# Patient Record
Sex: Female | Born: 1945
Health system: Southern US, Community
[De-identification: ages and names within clinical notes are randomized; demographics above are authoritative.]

## PROBLEM LIST (undated history)

## (undated) DIAGNOSIS — K649 Unspecified hemorrhoids: Secondary | ICD-10-CM

## (undated) DIAGNOSIS — K219 Gastro-esophageal reflux disease without esophagitis: Secondary | ICD-10-CM

## (undated) DIAGNOSIS — F419 Anxiety disorder, unspecified: Secondary | ICD-10-CM

## (undated) DIAGNOSIS — Z8719 Personal history of other diseases of the digestive system: Secondary | ICD-10-CM

## (undated) DIAGNOSIS — E785 Hyperlipidemia, unspecified: Secondary | ICD-10-CM

## (undated) DIAGNOSIS — N840 Polyp of corpus uteri: Secondary | ICD-10-CM

## (undated) DIAGNOSIS — K5790 Diverticulosis of intestine, part unspecified, without perforation or abscess without bleeding: Secondary | ICD-10-CM

## (undated) DIAGNOSIS — H409 Unspecified glaucoma: Secondary | ICD-10-CM

## (undated) DIAGNOSIS — I2510 Atherosclerotic heart disease of native coronary artery without angina pectoris: Secondary | ICD-10-CM

## (undated) DIAGNOSIS — I10 Essential (primary) hypertension: Secondary | ICD-10-CM

## (undated) DIAGNOSIS — N200 Calculus of kidney: Secondary | ICD-10-CM

## (undated) HISTORY — DX: Unspecified glaucoma: H40.9

## (undated) HISTORY — PX: INCISIONAL HERNIA REPAIR: SHX193

## (undated) HISTORY — DX: Atherosclerotic heart disease of native coronary artery without angina pectoris: I25.10

## (undated) HISTORY — DX: Anxiety disorder, unspecified: F41.9

## (undated) HISTORY — PX: VENTRAL HERNIA REPAIR: SHX424

## (undated) HISTORY — DX: Gastro-esophageal reflux disease without esophagitis: K21.9

## (undated) HISTORY — DX: Calculus of kidney: N20.0

## (undated) HISTORY — PX: OTHER SURGICAL HISTORY: SHX169

## (undated) HISTORY — DX: Hyperlipidemia, unspecified: E78.5

## (undated) HISTORY — PX: ABDOMINAL EXPLORATION SURGERY: SHX538

## (undated) HISTORY — DX: Essential (primary) hypertension: I10

## (undated) HISTORY — PX: HERNIA REPAIR: SHX51

---

## 1997-12-08 ENCOUNTER — Observation Stay (HOSPITAL_COMMUNITY): Admission: EM | Admit: 1997-12-08 | Discharge: 1997-12-09 | Payer: Self-pay | Admitting: Emergency Medicine

## 1997-12-08 ENCOUNTER — Ambulatory Visit (HOSPITAL_COMMUNITY): Admission: RE | Admit: 1997-12-08 | Discharge: 1997-12-08 | Payer: Self-pay | Admitting: General Surgery

## 1998-06-04 ENCOUNTER — Inpatient Hospital Stay (HOSPITAL_COMMUNITY): Admission: RE | Admit: 1998-06-04 | Discharge: 1998-06-15 | Payer: Self-pay | Admitting: Surgery

## 1998-06-05 ENCOUNTER — Encounter: Payer: Self-pay | Admitting: Surgery

## 1998-06-11 ENCOUNTER — Encounter: Payer: Self-pay | Admitting: Surgery

## 1998-06-13 ENCOUNTER — Encounter: Payer: Self-pay | Admitting: General Surgery

## 1998-10-19 ENCOUNTER — Other Ambulatory Visit: Admission: RE | Admit: 1998-10-19 | Discharge: 1998-10-19 | Payer: Self-pay | Admitting: Obstetrics & Gynecology

## 1999-03-22 ENCOUNTER — Encounter: Payer: Self-pay | Admitting: Urology

## 1999-03-22 ENCOUNTER — Ambulatory Visit (HOSPITAL_COMMUNITY): Admission: RE | Admit: 1999-03-22 | Discharge: 1999-03-22 | Payer: Self-pay | Admitting: Urology

## 1999-04-06 ENCOUNTER — Other Ambulatory Visit: Admission: RE | Admit: 1999-04-06 | Discharge: 1999-04-06 | Payer: Self-pay | Admitting: Obstetrics & Gynecology

## 1999-04-06 ENCOUNTER — Encounter (INDEPENDENT_AMBULATORY_CARE_PROVIDER_SITE_OTHER): Payer: Self-pay

## 1999-09-20 ENCOUNTER — Other Ambulatory Visit: Admission: RE | Admit: 1999-09-20 | Discharge: 1999-09-20 | Payer: Self-pay | Admitting: Obstetrics & Gynecology

## 2000-03-01 ENCOUNTER — Encounter: Payer: Self-pay | Admitting: Surgery

## 2000-03-07 ENCOUNTER — Inpatient Hospital Stay (HOSPITAL_COMMUNITY): Admission: RE | Admit: 2000-03-07 | Discharge: 2000-03-23 | Payer: Self-pay | Admitting: Surgery

## 2000-03-19 ENCOUNTER — Encounter: Payer: Self-pay | Admitting: Internal Medicine

## 2000-03-22 ENCOUNTER — Encounter: Payer: Self-pay | Admitting: General Surgery

## 2000-03-29 ENCOUNTER — Inpatient Hospital Stay (HOSPITAL_COMMUNITY): Admission: EM | Admit: 2000-03-29 | Discharge: 2000-04-01 | Payer: Self-pay | Admitting: Internal Medicine

## 2000-03-29 ENCOUNTER — Encounter: Payer: Self-pay | Admitting: Internal Medicine

## 2000-03-30 ENCOUNTER — Encounter: Payer: Self-pay | Admitting: Endocrinology

## 2000-03-31 ENCOUNTER — Encounter: Payer: Self-pay | Admitting: Endocrinology

## 2001-01-28 ENCOUNTER — Other Ambulatory Visit: Admission: RE | Admit: 2001-01-28 | Discharge: 2001-01-28 | Payer: Self-pay | Admitting: *Deleted

## 2001-02-01 ENCOUNTER — Encounter: Payer: Self-pay | Admitting: Emergency Medicine

## 2001-02-01 ENCOUNTER — Emergency Department (HOSPITAL_COMMUNITY): Admission: EM | Admit: 2001-02-01 | Discharge: 2001-02-01 | Payer: Self-pay | Admitting: Emergency Medicine

## 2001-03-29 ENCOUNTER — Encounter: Payer: Self-pay | Admitting: Emergency Medicine

## 2001-03-30 ENCOUNTER — Inpatient Hospital Stay (HOSPITAL_COMMUNITY): Admission: EM | Admit: 2001-03-30 | Discharge: 2001-04-02 | Payer: Self-pay | Admitting: Cardiology

## 2001-03-30 ENCOUNTER — Encounter: Payer: Self-pay | Admitting: Cardiology

## 2001-07-23 ENCOUNTER — Encounter: Payer: Self-pay | Admitting: Internal Medicine

## 2001-07-23 ENCOUNTER — Ambulatory Visit (HOSPITAL_COMMUNITY): Admission: RE | Admit: 2001-07-23 | Discharge: 2001-07-23 | Payer: Self-pay | Admitting: Internal Medicine

## 2001-08-01 ENCOUNTER — Ambulatory Visit (HOSPITAL_COMMUNITY): Admission: RE | Admit: 2001-08-01 | Discharge: 2001-08-01 | Payer: Self-pay | Admitting: Internal Medicine

## 2001-08-01 ENCOUNTER — Encounter: Payer: Self-pay | Admitting: Internal Medicine

## 2001-08-30 ENCOUNTER — Ambulatory Visit (HOSPITAL_COMMUNITY): Admission: RE | Admit: 2001-08-30 | Discharge: 2001-08-30 | Payer: Self-pay | Admitting: Urology

## 2001-08-30 ENCOUNTER — Encounter: Payer: Self-pay | Admitting: Urology

## 2002-10-21 ENCOUNTER — Ambulatory Visit (HOSPITAL_COMMUNITY): Admission: RE | Admit: 2002-10-21 | Discharge: 2002-10-21 | Payer: Self-pay | Admitting: *Deleted

## 2002-10-21 ENCOUNTER — Encounter: Payer: Self-pay | Admitting: *Deleted

## 2003-07-07 ENCOUNTER — Ambulatory Visit (HOSPITAL_COMMUNITY): Admission: RE | Admit: 2003-07-07 | Discharge: 2003-07-07 | Payer: Self-pay | Admitting: Urology

## 2004-08-25 ENCOUNTER — Ambulatory Visit: Payer: Self-pay | Admitting: Internal Medicine

## 2004-09-06 ENCOUNTER — Ambulatory Visit: Payer: Self-pay | Admitting: Internal Medicine

## 2004-09-16 ENCOUNTER — Ambulatory Visit: Payer: Self-pay | Admitting: Internal Medicine

## 2004-09-27 ENCOUNTER — Ambulatory Visit (HOSPITAL_COMMUNITY): Admission: RE | Admit: 2004-09-27 | Discharge: 2004-09-27 | Payer: Self-pay | Admitting: Ophthalmology

## 2004-11-28 ENCOUNTER — Emergency Department (HOSPITAL_COMMUNITY): Admission: EM | Admit: 2004-11-28 | Discharge: 2004-11-28 | Payer: Self-pay | Admitting: *Deleted

## 2004-11-29 ENCOUNTER — Emergency Department (HOSPITAL_COMMUNITY): Admission: EM | Admit: 2004-11-29 | Discharge: 2004-11-29 | Payer: Self-pay | Admitting: Emergency Medicine

## 2004-11-30 ENCOUNTER — Ambulatory Visit: Payer: Self-pay | Admitting: Family Medicine

## 2004-12-06 ENCOUNTER — Ambulatory Visit: Payer: Self-pay | Admitting: Internal Medicine

## 2005-01-23 ENCOUNTER — Ambulatory Visit: Payer: Self-pay | Admitting: Family Medicine

## 2005-03-07 ENCOUNTER — Ambulatory Visit: Payer: Self-pay | Admitting: Internal Medicine

## 2005-06-12 ENCOUNTER — Ambulatory Visit: Payer: Self-pay | Admitting: Internal Medicine

## 2005-06-19 ENCOUNTER — Ambulatory Visit: Payer: Self-pay | Admitting: Internal Medicine

## 2005-07-27 ENCOUNTER — Ambulatory Visit: Payer: Self-pay | Admitting: Internal Medicine

## 2005-08-08 ENCOUNTER — Ambulatory Visit (HOSPITAL_COMMUNITY): Admission: RE | Admit: 2005-08-08 | Discharge: 2005-08-08 | Payer: Self-pay | Admitting: General Surgery

## 2005-08-18 ENCOUNTER — Inpatient Hospital Stay (HOSPITAL_COMMUNITY): Admission: RE | Admit: 2005-08-18 | Discharge: 2005-08-24 | Payer: Self-pay | Admitting: General Surgery

## 2005-11-13 ENCOUNTER — Ambulatory Visit: Payer: Self-pay | Admitting: Internal Medicine

## 2005-11-27 ENCOUNTER — Ambulatory Visit: Payer: Self-pay | Admitting: Internal Medicine

## 2006-01-22 ENCOUNTER — Ambulatory Visit: Payer: Self-pay | Admitting: Internal Medicine

## 2006-01-25 ENCOUNTER — Ambulatory Visit (HOSPITAL_COMMUNITY): Admission: RE | Admit: 2006-01-25 | Discharge: 2006-01-25 | Payer: Self-pay | Admitting: Obstetrics & Gynecology

## 2006-02-01 ENCOUNTER — Inpatient Hospital Stay (HOSPITAL_COMMUNITY): Admission: EM | Admit: 2006-02-01 | Discharge: 2006-02-05 | Payer: Self-pay | Admitting: Emergency Medicine

## 2006-02-03 ENCOUNTER — Ambulatory Visit: Payer: Self-pay | Admitting: Internal Medicine

## 2006-02-27 ENCOUNTER — Ambulatory Visit: Payer: Self-pay | Admitting: Internal Medicine

## 2006-03-08 ENCOUNTER — Ambulatory Visit: Payer: Self-pay | Admitting: Internal Medicine

## 2006-05-08 ENCOUNTER — Ambulatory Visit: Payer: Self-pay | Admitting: Internal Medicine

## 2006-05-11 ENCOUNTER — Ambulatory Visit (HOSPITAL_COMMUNITY): Admission: RE | Admit: 2006-05-11 | Discharge: 2006-05-11 | Payer: Self-pay | Admitting: General Surgery

## 2006-06-03 ENCOUNTER — Emergency Department (HOSPITAL_COMMUNITY): Admission: EM | Admit: 2006-06-03 | Discharge: 2006-06-03 | Payer: Self-pay | Admitting: Emergency Medicine

## 2006-08-09 ENCOUNTER — Ambulatory Visit: Payer: Self-pay | Admitting: Internal Medicine

## 2006-08-09 LAB — CONVERTED CEMR LAB
ALT: 16 units/L (ref 0–40)
AST: 20 units/L (ref 0–37)
Albumin: 4 g/dL (ref 3.5–5.2)
BUN: 7 mg/dL (ref 6–23)
Basophils Relative: 1.1 % — ABNORMAL HIGH (ref 0.0–1.0)
Eosinophil percent: 4.3 % (ref 0.0–5.0)
GFR calc non Af Amer: 68 mL/min
HCT: 39.1 % (ref 36.0–46.0)
Hemoglobin: 13.3 g/dL (ref 12.0–15.0)
MCHC: 34 g/dL (ref 30.0–36.0)
Neutro Abs: 5.9 10*3/uL (ref 1.4–7.7)
Neutrophils Relative %: 66.2 % (ref 43.0–77.0)
Potassium: 3.9 meq/L (ref 3.5–5.1)
RBC: 4.16 M/uL (ref 3.87–5.11)
RDW: 12.8 % (ref 11.5–14.6)
Sodium: 142 meq/L (ref 135–145)
Total Bilirubin: 0.9 mg/dL (ref 0.3–1.2)
Total Protein: 6.8 g/dL (ref 6.0–8.3)
Triglyceride fasting, serum: 159 mg/dL — ABNORMAL HIGH (ref 0–149)
VLDL: 32 mg/dL (ref 0–40)
WBC: 9 10*3/uL (ref 4.5–10.5)

## 2006-11-08 ENCOUNTER — Ambulatory Visit: Payer: Self-pay | Admitting: Internal Medicine

## 2007-01-25 ENCOUNTER — Ambulatory Visit: Payer: Self-pay | Admitting: Internal Medicine

## 2007-03-14 DIAGNOSIS — K219 Gastro-esophageal reflux disease without esophagitis: Secondary | ICD-10-CM

## 2007-03-14 DIAGNOSIS — E785 Hyperlipidemia, unspecified: Secondary | ICD-10-CM

## 2007-03-14 DIAGNOSIS — I10 Essential (primary) hypertension: Secondary | ICD-10-CM

## 2007-03-14 DIAGNOSIS — Z87442 Personal history of urinary calculi: Secondary | ICD-10-CM

## 2007-03-14 DIAGNOSIS — I2510 Atherosclerotic heart disease of native coronary artery without angina pectoris: Secondary | ICD-10-CM | POA: Insufficient documentation

## 2007-03-14 DIAGNOSIS — F411 Generalized anxiety disorder: Secondary | ICD-10-CM | POA: Insufficient documentation

## 2007-03-14 DIAGNOSIS — H409 Unspecified glaucoma: Secondary | ICD-10-CM

## 2007-03-27 ENCOUNTER — Ambulatory Visit: Payer: Self-pay | Admitting: Internal Medicine

## 2007-03-27 DIAGNOSIS — R609 Edema, unspecified: Secondary | ICD-10-CM

## 2007-03-27 DIAGNOSIS — R1907 Generalized intra-abdominal and pelvic swelling, mass and lump: Secondary | ICD-10-CM | POA: Insufficient documentation

## 2007-03-28 ENCOUNTER — Telehealth (INDEPENDENT_AMBULATORY_CARE_PROVIDER_SITE_OTHER): Payer: Self-pay | Admitting: *Deleted

## 2007-03-29 ENCOUNTER — Ambulatory Visit (HOSPITAL_COMMUNITY): Admission: RE | Admit: 2007-03-29 | Discharge: 2007-03-29 | Payer: Self-pay | Admitting: General Surgery

## 2007-05-06 ENCOUNTER — Telehealth: Payer: Self-pay | Admitting: Internal Medicine

## 2007-06-28 ENCOUNTER — Ambulatory Visit: Payer: Self-pay | Admitting: Internal Medicine

## 2007-08-29 ENCOUNTER — Ambulatory Visit: Payer: Self-pay | Admitting: Internal Medicine

## 2007-08-29 DIAGNOSIS — R5383 Other fatigue: Secondary | ICD-10-CM

## 2007-08-29 DIAGNOSIS — R5381 Other malaise: Secondary | ICD-10-CM

## 2007-08-29 LAB — CONVERTED CEMR LAB
ALT: 21 units/L (ref 0–35)
AST: 20 units/L (ref 0–37)
Bilirubin, Direct: 0.1 mg/dL (ref 0.0–0.3)
CO2: 32 meq/L (ref 19–32)
Calcium: 9.2 mg/dL (ref 8.4–10.5)
Cholesterol: 136 mg/dL (ref 0–200)
GFR calc Af Amer: 94 mL/min
Glucose, Bld: 93 mg/dL (ref 70–99)
HDL: 33.8 mg/dL — ABNORMAL LOW (ref >39.0)
Sodium: 145 meq/L (ref 135–145)
TSH: 2.26 microintl units/mL (ref 0.35–5.50)
Total CHOL/HDL Ratio: 4
Total Protein: 6.7 g/dL (ref 6.0–8.3)

## 2007-09-05 ENCOUNTER — Ambulatory Visit: Payer: Self-pay | Admitting: Internal Medicine

## 2007-09-05 LAB — CONVERTED CEMR LAB: LDL Goal: 100 mg/dL

## 2008-05-12 ENCOUNTER — Ambulatory Visit: Payer: Self-pay | Admitting: Internal Medicine

## 2008-08-18 ENCOUNTER — Ambulatory Visit: Payer: Self-pay | Admitting: Internal Medicine

## 2008-08-18 LAB — CONVERTED CEMR LAB
HDL: 34.7 mg/dL — ABNORMAL LOW (ref >39.0)
Total Bilirubin: 0.7 mg/dL (ref 0.3–1.2)
Total CHOL/HDL Ratio: 3.8
VLDL: 31 mg/dL (ref 0–40)

## 2008-08-25 ENCOUNTER — Ambulatory Visit: Payer: Self-pay | Admitting: Internal Medicine

## 2008-11-25 ENCOUNTER — Ambulatory Visit: Payer: Self-pay | Admitting: Internal Medicine

## 2009-03-08 ENCOUNTER — Ambulatory Visit: Payer: Self-pay | Admitting: Internal Medicine

## 2009-04-13 ENCOUNTER — Ambulatory Visit: Payer: Self-pay | Admitting: Internal Medicine

## 2009-04-13 ENCOUNTER — Telehealth: Payer: Self-pay | Admitting: Internal Medicine

## 2009-04-13 DIAGNOSIS — L259 Unspecified contact dermatitis, unspecified cause: Secondary | ICD-10-CM

## 2009-06-15 ENCOUNTER — Ambulatory Visit: Payer: Self-pay | Admitting: Internal Medicine

## 2009-06-15 LAB — CONVERTED CEMR LAB
CO2: 30 meq/L (ref 19–32)
Cholesterol: 122 mg/dL (ref 0–200)
Glucose, Bld: 92 mg/dL (ref 70–99)
Potassium: 4.4 meq/L (ref 3.5–5.1)
Sodium: 140 meq/L (ref 135–145)

## 2009-07-26 ENCOUNTER — Telehealth: Payer: Self-pay | Admitting: Internal Medicine

## 2009-10-05 ENCOUNTER — Telehealth: Payer: Self-pay | Admitting: Internal Medicine

## 2009-10-19 ENCOUNTER — Ambulatory Visit: Payer: Self-pay | Admitting: Internal Medicine

## 2010-02-08 ENCOUNTER — Telehealth: Payer: Self-pay | Admitting: Internal Medicine

## 2010-04-25 ENCOUNTER — Telehealth: Payer: Self-pay | Admitting: Internal Medicine

## 2010-04-27 ENCOUNTER — Telehealth: Payer: Self-pay | Admitting: Internal Medicine

## 2010-05-06 ENCOUNTER — Telehealth: Payer: Self-pay | Admitting: Internal Medicine

## 2010-06-22 ENCOUNTER — Encounter: Payer: Self-pay | Admitting: Internal Medicine

## 2010-06-22 ENCOUNTER — Ambulatory Visit: Payer: Self-pay | Admitting: Internal Medicine

## 2010-06-22 LAB — CONVERTED CEMR LAB
ALT: 17 units/L (ref 0–35)
AST: 21 units/L (ref 0–37)
BUN: 14 mg/dL (ref 6–23)
Chloride: 102 meq/L (ref 96–112)
Cholesterol: 120 mg/dL (ref 0–200)
Eosinophils Relative: 3.8 % (ref 0.0–5.0)
HCT: 38.2 % (ref 36.0–46.0)
Hemoglobin: 13.4 g/dL (ref 12.0–15.0)
LDL Cholesterol: 52 mg/dL (ref 0–99)
Lymphs Abs: 1.8 10*3/uL (ref 0.7–4.0)
Monocytes Relative: 7.6 % (ref 3.0–12.0)
Neutro Abs: 6.6 10*3/uL (ref 1.4–7.7)
Potassium: 4.3 meq/L (ref 3.5–5.1)
RBC: 3.95 M/uL (ref 3.87–5.11)
TSH: 2.8 microintl units/mL (ref 0.35–5.50)
Total Bilirubin: 0.5 mg/dL (ref 0.3–1.2)
WBC: 9.6 10*3/uL (ref 4.5–10.5)

## 2010-07-20 ENCOUNTER — Ambulatory Visit: Payer: Self-pay

## 2010-07-20 ENCOUNTER — Telehealth: Payer: Self-pay | Admitting: Internal Medicine

## 2010-08-17 ENCOUNTER — Ambulatory Visit
Admission: RE | Admit: 2010-08-17 | Discharge: 2010-08-17 | Payer: Self-pay | Source: Home / Self Care | Attending: Internal Medicine | Admitting: Internal Medicine

## 2010-08-19 ENCOUNTER — Telehealth: Payer: Self-pay | Admitting: Internal Medicine

## 2010-09-03 ENCOUNTER — Encounter: Payer: Self-pay | Admitting: Urology

## 2010-09-15 NOTE — Assessment & Plan Note (Signed)
Summary: 4 mo rov/mm/pt rsc/cjr   Vital Signs:  Patient profile:   65 year old female Height:      68 inches Weight:      194 pounds BMI:     29.60 Temp:     98.2 degrees F oral Pulse rate:   76 / minute Resp:     14 per minute BP sitting:   130 / 80  (left arm)  Vitals Entered By: Willy Eddy, LPN (October 19, 3084 9:39 AM) CC: roa   CC:  roa.  History of Present Illness:  Follow-Up Visit      This is a 65 year old woman who presents for Follow-up visit.  The patient denies chest pain, palpitations, dizziness, syncope, low blood sugar symptoms, high blood sugar symptoms, edema, SOB, DOE, PND, and orthopnea.  Since the last visit the patient notes no new problems or concerns.  The patient reports monitoring BP and monitoring blood sugars.  When questioned about possible medication side effects, the patient notes none.   anxiety sable  Preventive Screening-Counseling & Management  Alcohol-Tobacco     Smoking Status: never  Problems Prior to Update: 1)  Skin Rash  (ICD-782.1) 2)  Fatigue  (ICD-780.79) 3)  Family History of Cad Female 1st Degree Relative <50  (ICD-V17.3) 4)  Symptom, Swelling, Abdominal, Generalized  (ICD-789.37) 5)  Dependent Edema, Legs  (ICD-782.3) 6)  Nephrolithiasis, Hx of  (ICD-V13.01) 7)  Coronary Artery Disease  (ICD-414.00) 8)  Anxiety  (ICD-300.00) 9)  Glaucoma Nos  (ICD-365.9) 10)  Hypertension  (ICD-401.9) 11)  Hyperlipidemia  (ICD-272.4) 12)  Gerd  (ICD-530.81)  Current Problems (verified): 1)  Skin Rash  (ICD-782.1) 2)  Fatigue  (ICD-780.79) 3)  Family History of Cad Female 1st Degree Relative <50  (ICD-V17.3) 4)  Symptom, Swelling, Abdominal, Generalized  (ICD-789.37) 5)  Dependent Edema, Legs  (ICD-782.3) 6)  Nephrolithiasis, Hx of  (ICD-V13.01) 7)  Coronary Artery Disease  (ICD-414.00) 8)  Anxiety  (ICD-300.00) 9)  Glaucoma Nos  (ICD-365.9) 10)  Hypertension  (ICD-401.9) 11)  Hyperlipidemia  (ICD-272.4) 12)  Gerd   (ICD-530.81)  Medications Prior to Update: 1)  Isosorbide Mononitrate Cr 30 Mg Tb24 (Isosorbide Mononitrate) .... Once Daily 2)  Tarka 2-180 Mg Tbcr (Trandolapril-Verapamil Hcl) .... Once Daily 3)  Alprazolam 0.25 Mg  Tabs (Alprazolam) .... One Two To Three Times Per Day As Needed 4)  Betimol 0.5 %  Soln (Timolol) .... As Directed 5)  Adult Aspirin Low Strength 81 Mg  Tbdp (Aspirin) .... Once Daily 6)  Vytorin 10-20 Mg  Tabs (Ezetimibe-Simvastatin) .... Once Daily 7)  Dolgic Plus 50-750-40 Mg Tabs (Butalbital-Apap-Caffeine) .Marland Kitchen.. 1 Every 4-6 As Needed Headache 8)  Triamterene-Hctz 37.5-25 Mg Caps (Triamterene-Hctz) .Marland Kitchen.. 1 By Mouth Daily As Directed 9)  Triamcinolone Acetonide 0.5 % Crea (Triamcinolone Acetonide) .... Use As Directed 10)  Fish Oil Concentrate 1000 Mg Caps (Omega-3 Fatty Acids) .... Two By Mouth Two Times A Day 11)  Zithromax Z-Pak 250 Mg Tabs (Azithromycin) .... One By Mouth Daily 12)  Allerx Dose Pack  Misc (Pse-Methscop & Cpm-Pe-Methscop) .... As Directed 13)  Zithromax Z-Pak 250 Mg Tabs (Azithromycin) .... As Directed  Current Medications (verified): 1)  Isosorbide Mononitrate Cr 30 Mg Tb24 (Isosorbide Mononitrate) .... Once Daily 2)  Tarka 2-180 Mg Tbcr (Trandolapril-Verapamil Hcl) .... Once Daily 3)  Alprazolam 0.25 Mg  Tabs (Alprazolam) .... One Two To Three Times Per Day As Needed 4)  Betimol 0.5 %  Soln (Timolol) .Marland KitchenMarland KitchenMarland Kitchen  As Directed 5)  Adult Aspirin Low Strength 81 Mg  Tbdp (Aspirin) .... Once Daily 6)  Vytorin 10-20 Mg  Tabs (Ezetimibe-Simvastatin) .... Once Daily 7)  Dolgic Plus 50-750-40 Mg Tabs (Butalbital-Apap-Caffeine) .Marland Kitchen.. 1 Every 4-6 As Needed Headache 8)  Triamterene-Hctz 37.5-25 Mg Caps (Triamterene-Hctz) .Marland Kitchen.. 1 By Mouth Daily As Directed 9)  Triamcinolone Acetonide 0.5 % Crea (Triamcinolone Acetonide) .... Use As Directed 10)  Fish Oil Concentrate 1000 Mg Caps (Omega-3 Fatty Acids) .... Two By Mouth Two Times A Day  Allergies (verified): No Known Drug  Allergies  Past History:  Family History: Last updated: 07/21/07 father died of MI at 51 mother died from MI at 32 Family History of CAD Female 1st degree relative <50 brothers  Social History: Last updated: 2007-07-21 Retired Married Never Smoked  Risk Factors: Smoking Status: never (10/19/2009)  Past medical, surgical, family and social histories (including risk factors) reviewed, and no changes noted (except as noted below).  Past Medical History: Reviewed history from 03/14/2007 and no changes required. GERD Hyperlipidemia Hypertension Glaucoma 365.9 Anxiety Coronary artery disease with catheterization in 2002 Nephrolithiasis, hx of status post surgical intervention  Past Surgical History: Reviewed history from 03/14/2007 and no changes required. ventral hernia repair incisional hernia repair herniorrhaphies multiple in [phone] hemicolection in 1999 during exploratory surgery for abd pain bilateral lens implants  Family History: Reviewed history from 07/21/07 and no changes required. father died of MI at 34 mother died from MI at 77 Family History of CAD Female 1st degree relative <50 brothers  Social History: Reviewed history from Jul 21, 2007 and no changes required. Retired Married Never Smoked  Review of Systems  The patient denies anorexia, fever, weight loss, weight gain, vision loss, decreased hearing, hoarseness, chest pain, syncope, dyspnea on exertion, peripheral edema, prolonged cough, headaches, hemoptysis, abdominal pain, melena, hematochezia, severe indigestion/heartburn, hematuria, incontinence, genital sores, muscle weakness, suspicious skin lesions, transient blindness, difficulty walking, depression, unusual weight change, abnormal bleeding, enlarged lymph nodes, angioedema, and breast masses.    Physical Exam  General:  Well-developed,well-nourished,in no acute distress; alert,appropriate and cooperative throughout  examination Eyes:  pupils equal and pupils round.   Ears:  R ear normal and L ear normal.   Nose:  no external deformity and no nasal discharge.   Lungs:  normal respiratory effort and no wheezes.   Heart:  normal rate and regular rhythm.   Abdomen:  soft, non-tender, and normal bowel sounds.   Msk:  normal ROM and no joint tenderness.   Neurologic:  alert & oriented X3 and DTRs symmetrical and normal.   Psych:  normally interactive and not anxious appearing.     Impression & Recommendations:  Problem # 1:  CORONARY ARTERY DISEASE (ICD-414.00) Assessment Improved no chst pains Her updated medication list for this problem includes:    Isosorbide Mononitrate Cr 30 Mg Tb24 (Isosorbide mononitrate) ..... Once daily    Tarka 2-180 Mg Tbcr (Trandolapril-verapamil hcl) ..... Once daily    Adult Aspirin Low Strength 81 Mg Tbdp (Aspirin) ..... Once daily    Triamterene-hctz 37.5-25 Mg Caps (Triamterene-hctz) .Marland Kitchen... 1 by mouth daily as directed  Labs Reviewed: Chol: 122 (06/15/2009)   HDL: 43.70 (06/15/2009)   LDL: 68 (08/18/2008)   TG: 154 (08/18/2008)  Lipid Goals: Chol Goal: 200 (09/05/2007)   HDL Goal: 40 (09/05/2007)   LDL Goal: 100 (09/05/2007)   TG Goal: 150 (09/05/2007)  Problem # 2:  HYPERTENSION (ICD-401.9) Assessment: Improved stable blood pressure Her updated medication list for this problem includes:  Tarka 2-180 Mg Tbcr (Trandolapril-verapamil hcl) ..... Once daily    Triamterene-hctz 37.5-25 Mg Caps (Triamterene-hctz) .Marland Kitchen... 1 by mouth daily as directed  BP today: 130/80 Prior BP: 140/84 (06/15/2009)  Prior 10 Yr Risk Heart Disease: N/A (06/28/2007)  Labs Reviewed: K+: 4.4 (06/15/2009) Creat: : 0.9 (06/15/2009)   Chol: 122 (06/15/2009)   HDL: 43.70 (06/15/2009)   LDL: 68 (08/18/2008)   TG: 154 (08/18/2008)  Problem # 3:  GERD (ICD-530.81) stable Labs Reviewed: Hgb: 13.3 (08/09/2006)   Hct: 39.1 (08/09/2006)  Complete Medication List: 1)  Isosorbide Mononitrate  Cr 30 Mg Tb24 (Isosorbide mononitrate) .... Once daily 2)  Tarka 2-180 Mg Tbcr (Trandolapril-verapamil hcl) .... Once daily 3)  Alprazolam 0.25 Mg Tabs (Alprazolam) .... One two to three times per day as needed 4)  Betimol 0.5 % Soln (Timolol) .... As directed 5)  Adult Aspirin Low Strength 81 Mg Tbdp (Aspirin) .... Once daily 6)  Vytorin 10-20 Mg Tabs (Ezetimibe-simvastatin) .... Once daily 7)  Dolgic Plus 50-750-40 Mg Tabs (Butalbital-apap-caffeine) .Marland Kitchen.. 1 every 4-6 as needed headache 8)  Triamterene-hctz 37.5-25 Mg Caps (Triamterene-hctz) .Marland Kitchen.. 1 by mouth daily as directed 9)  Triamcinolone Acetonide 0.5 % Crea (Triamcinolone acetonide) .... Use as directed 10)  Fish Oil Concentrate 1000 Mg Caps (Omega-3 fatty acids) .... Two by mouth two times a day  Patient Instructions: 1)  fasting visit in 3 months for a medicare preventative visit

## 2010-09-15 NOTE — Progress Notes (Signed)
Summary: sinus complaints.  Phone Note Call from Patient   Caller: Patient Call For: Stacie Glaze MD Reason for Call: Acute Illness Complaint: Cough/Sore throat, Headache Summary of Call: Taking Mucinex, Nyquil and Tylenol for sinus and chest congestion symptoms.  Color green.  [phone] [facility] in [place].   Initial call taken by: Lynann Beaver CMA,  October 05, 2009 10:06 AM  Follow-up for Phone Call        zpack and allerx dose pk Follow-up by: Stacie Glaze MD,  October 05, 2009 1:51 PM    New/Updated Medications: Selinda Eon DOSE PACK  MISC (PSE-METHSCOP & CPM-PE-METHSCOP) as directed ZITHROMAX Z-PAK 250 MG TABS (AZITHROMYCIN) as directed Prescriptions: ALLERX DOSE PACK  MISC (PSE-METHSCOP & CPM-PE-METHSCOP) as directed  #1 pack x 0   Entered by:   Lynann Beaver CMA   Authorized by:   Stacie Glaze MD   Signed by:   Lynann Beaver CMA on 10/05/2009   Method used:   Electronically to        [facility] (retail)       [address]       [place], [place]  [address]       Ph: [phone]       Fax: [phone]   RxID:   [id number] ZITHROMAX Z-PAK 250 MG TABS (AZITHROMYCIN) as directed  #6 x 0   Entered by:   Lynann Beaver CMA   Authorized by:   Stacie Glaze MD   Signed by:   Lynann Beaver CMA on 10/05/2009   Method used:   Electronically to        [facility] (retail)       [address]       [place], [place]  [address]       Ph: [phone]       Fax: [phone]   RxID:   [id number]  Pt. notified.

## 2010-09-15 NOTE — Progress Notes (Signed)
Summary: pain meds  Phone Note Call from Patient   Caller: Patient Call For: Stacie Glaze MD Summary of Call: Pt would like to have RX stronger than Extra Strength Tylenol for shingles....Marland KitchenMarland KitchenNot too strong though.  [facility]. Initial call taken by: Lynann Beaver CMA,  April 27, 2010 11:22 AM  Follow-up for Phone Call        vicodin  5/325   number 20 one by mouth tID  as needed  Follow-up by: Stacie Glaze MD,  April 27, 2010 3:53 PM  Additional Follow-up for Phone Call Additional follow up Details #1::        Phone Call Completed.  Left message to inform.   Additional Follow-up by: Rudy Jew, RN,  April 27, 2010 5:10 PM    New/Updated Medications: HYDROCODONE-ACETAMINOPHEN 5-325 MG TABS (HYDROCODONE-ACETAMINOPHEN) One by mouth three times a day prn Prescriptions: HYDROCODONE-ACETAMINOPHEN 5-325 MG TABS (HYDROCODONE-ACETAMINOPHEN) One by mouth three times a day prn  #20 x 0   Entered by:   Rudy Jew, RN   Authorized by:   Stacie Glaze MD   Signed by:   Rudy Jew, RN on 04/27/2010   Method used:   Historical   RxID:   [id number]

## 2010-09-15 NOTE — Progress Notes (Signed)
  Phone Note Call from Patient   Caller: Patient Call For: Stacie Glaze MD Summary of Call: Pt is calling to report she thinks she has shingles. Symptoms x 4 days......Marland KitchenBack pain started on right side with parathesia.......then she developed a rash in the same area.  Rash looks red and raised, and really painfully. [facility]. [phone] Initial call taken by: Lynann Beaver CMA,  April 25, 2010 1:27 PM  Follow-up for Phone Call        Valtrex (generic)  1 gm three times a day x 7 days. Follow-up by: Lynann Beaver CMA,  April 25, 2010 2:54 PM    New/Updated Medications: VALTREX 1 GM TABS (VALACYCLOVIR HCL) one by mouth three times a day x 7 days Prescriptions: VALTREX 1 GM TABS (VALACYCLOVIR HCL) one by mouth three times a day x 7 days  #21 x 0   Entered by:   Lynann Beaver CMA   Authorized by:   Stacie Glaze MD   Signed by:   Lynann Beaver CMA on 04/25/2010   Method used:   Electronically to        [facility] (retail)       [address]       [place], [place]  [address]       Ph: [phone]       Fax: [phone]   RxID:   [id number]  Notified pt.

## 2010-09-15 NOTE — Assessment & Plan Note (Signed)
Summary: COUGH/WHEEZING/CHEST CONGESTION/CJR   Vital Signs:  Patient profile:   65 year old female Weight:      186 pounds O2 Sat:      96 % Pulse rate:   92 / minute BP sitting:   120 / 90  (left arm)  Vitals Entered By: Kyung Rudd, CMA (August 17, 2010 11:18 AM) CC: cough, wheezing, chest congestion, SOB, fever, chills   Primary Care Provider:  Stacie Glaze MD  CC:  cough, wheezing, chest congestion, SOB, fever, and chills.  History of Present Illness: Patient presents to clinic as a workin for evaluation of cough and wheezing. Seen 12/7 with URI symptoms but improved over several day. Then symptoms began to worsen and she took zpak as prescribed without improvement. Now notes productive cough, intermittent wheezing and shortness of breath without f/c or hemoptysis. Husband is also sick.  Current Medications (verified): 1)  Isosorbide Mononitrate Cr 30 Mg Tb24 (Isosorbide Mononitrate) .... Once Daily 2)  Tarka 2-180 Mg Tbcr (Trandolapril-Verapamil Hcl) .... Once Daily 3)  Alprazolam 0.25 Mg  Tabs (Alprazolam) .... One Two To Three Times Per Day As Needed 4)  Betimol 0.5 %  Soln (Timolol) .... As Directed 5)  Adult Aspirin Low Strength 81 Mg  Tbdp (Aspirin) .... Once Daily 6)  Vytorin 10-20 Mg  Tabs (Ezetimibe-Simvastatin) .... Once Daily 7)  Triamterene-Hctz 37.5-25 Mg Caps (Triamterene-Hctz) .Marland Kitchen.. 1 By Mouth Daily As Directed 8)  Triamcinolone Acetonide 0.5 % Crea (Triamcinolone Acetonide) .... Use As Directed 9)  Zithromax Z-Pak 250 Mg Tabs (Azithromycin) .... As Directed  Allergies (verified): No Known Drug Allergies  Past History:  Past medical, surgical, family and social histories (including risk factors) reviewed for relevance to current acute and chronic problems.  Past Medical History: Reviewed history from 03/14/2007 and no changes required. GERD Hyperlipidemia Hypertension Glaucoma 365.9 Anxiety Coronary artery disease with catheterization in  2002 Nephrolithiasis, hx of status post surgical intervention  Past Surgical History: Reviewed history from 03/14/2007 and no changes required. ventral hernia repair incisional hernia repair herniorrhaphies multiple in [phone] hemicolection in 1999 during exploratory surgery for abd pain bilateral lens implants  Family History: Reviewed history from 06/28/2007 and no changes required. father died of MI at 46 mother died from MI at 59 Family History of CAD Female 1st degree relative <50 brothers  Social History: Reviewed history from 06/28/2007 and no changes required. Retired Married Never Smoked  Review of Systems      See HPI General:  Denies chills, fever, and sweats. Eyes:  Denies discharge, eye irritation, eye pain, and red eye. ENT:  Complains of nasal congestion; denies ear discharge, earache, hoarseness, and sore throat. CV:  Complains of shortness of breath with exertion; denies chest pain or discomfort and difficulty breathing at night. Resp:  Complains of cough, shortness of breath, sputum productive, and wheezing; denies chest discomfort, chest pain with inspiration, and coughing up blood. Derm:  Denies changes in color of skin, itching, and rash.  Physical Exam  General:  Well-developed,well-nourished,in no acute distress; alert,appropriate and cooperative throughout examination Head:  Normocephalic and atraumatic without obvious abnormalities. No apparent alopecia or balding. Eyes:  pupils equal, pupils round, corneas and lenses clear, and no injection.   Ears:  R ear normal, L ear normal, and no external deformities.   Nose:  no external deformity+ mucosal erythema, and mucosal edema.   Mouth:  Oral mucosa and oropharynx without lesions or exudates.  Teeth in good repair. Neck:  No deformities,  masses, or tenderness noted. Lungs:  Normal respiratory effort, chest expands symmetrically. Lungs are clear to auscultation, no crackles or wheezes.normal  respiratory effort, no intercostal retractions, and no accessory muscle use.   Heart:  Normal rate and regular rhythm. S1 and S2 normal without gallop, murmur, click, rub or other extra sounds. Skin:  turgor normal, color normal, and no rashes.     Impression & Recommendations:  Problem # 1:  COUGH (ICD-786.2) Assessment Deteriorated Worsening despite zpak. Begin levaquin x 7d. Discussed CXR however pt wishes to proceed with treatment. Followup closely if no improvement or worsening.  Problem # 2:  WHEEZING (ICD-786.07) Assessment: New Likely reactive with recent infxn. Begin medrol dosepak and albuterol mdi as needed. Followup if no improvement or worsening.  Complete Medication List: 1)  Isosorbide Mononitrate Cr 30 Mg Tb24 (Isosorbide mononitrate) .... Once daily 2)  Tarka 2-180 Mg Tbcr (Trandolapril-verapamil hcl) .... Once daily 3)  Alprazolam 0.25 Mg Tabs (Alprazolam) .... One two to three times per day as needed 4)  Betimol 0.5 % Soln (Timolol) .... As directed 5)  Adult Aspirin Low Strength 81 Mg Tbdp (Aspirin) .... Once daily 6)  Vytorin 10-20 Mg Tabs (Ezetimibe-simvastatin) .... Once daily 7)  Triamterene-hctz 37.5-25 Mg Caps (Triamterene-hctz) .Marland Kitchen.. 1 by mouth daily as directed 8)  Triamcinolone Acetonide 0.5 % Crea (Triamcinolone acetonide) .... Use as directed 9)  Levaquin 500 Mg Tabs (Levofloxacin) .... One by mouth qd 10)  Medrol (pak) 4 Mg Tabs (Methylprednisolone) .... As directed 11)  Proventil Hfa 108 (90 Base) Mcg/act Aers (Albuterol sulfate) .... 2 puffs q 4-6 hours as needed wheezing Prescriptions: PROVENTIL HFA 108 (90 BASE) MCG/ACT AERS (ALBUTEROL SULFATE) 2 puffs q 4-6 hours as needed wheezing  #1 x 0   Entered and Authorized by:   Edwyna Perfect MD   Signed by:   Edwyna Perfect MD on 08/17/2010   Method used:   Electronically to        [facility] (retail)       [address]       [place], [place]  [address]        Ph: [phone]       Fax: [phone]   RxID:   [id number] MEDROL (PAK) 4 MG TABS (METHYLPREDNISOLONE) as directed  #1 x 0   Entered and Authorized by:   Edwyna Perfect MD   Signed by:   Edwyna Perfect MD on 08/17/2010   Method used:   Electronically to        [facility] (retail)       [address]       [place], [place]  [address]       Ph: [phone]       Fax: [phone]   RxID:   [id number] LEVAQUIN 500 MG TABS (LEVOFLOXACIN) one by mouth qd  #7 x 0   Entered and Authorized by:   Edwyna Perfect MD   Signed by:   Edwyna Perfect MD on 08/17/2010   Method used:   Electronically to        [facility] (retail)       [address]       [place], [place]  [address]       Ph: [phone]       Fax: [phone]   RxID:  [id number]    Orders Added: 1)  Est. Patient Level IV [27253]

## 2010-09-15 NOTE — Progress Notes (Signed)
Summary: Pt called to give update on her conditon. Pt doing better  Phone Note Call from Patient Call back at Home Phone [phone]   Caller: Patient Summary of Call: Pt called to give update of her condition. Pt says that she if feeling a lot better. Prednisone worked Firefighter.  Initial call taken by: Lucy Antigua,  August 19, 2010 10:32 AM  Follow-up for Phone Call        noted Follow-up by: Edwyna Perfect MD,  August 19, 2010 11:56 AM

## 2010-09-15 NOTE — Assessment & Plan Note (Signed)
Summary: CPX (PT WILL COME IN FASTING) // RS pt rsc/njr   Vital Signs:  Patient profile:   65 year old female Height:      68 inches Weight:      182 pounds BMI:     27.77 Temp:     98.2 degrees F oral Pulse rate:   72 / minute Resp:     14 per minute BP sitting:   134 / 80  (left arm)  Vitals Entered By: Willy Eddy, LPN (June 22, 2010 11:00 AM) CC: annual visit for disease mmanagment-fasting- to make appt with gyn for pap and mam Is Patient Diabetic? No   Primary Care Provider:  Stacie Glaze MD  CC:  annual visit for disease mmanagment-fasting- to make appt with gyn for pap and mam.  History of Present Illness: I have personally reviewed the Medicare Annual Wellness questionnaire and have noted 1.   The patient's medical and social history 2.   Their use of alcohol, tobacco or illicit drugs 3.   Their current medications and supplements 4.   The patient's functional ability including ADL's, fall risks, home safety risks and hearing or visual             impairment. 5.   Diet and physical activities 6.   Evidence for depression or mood disorders The patients weight, height, BMI and visual acuity have been recorded in the chart I have made referrals, counseling and provided education to the patient based review of the above and I have provided the pt with a written personalized care plan for preventive services.   and she is folowed for GERD, HTN and hyperlipidemia on meidcaitons her [employer] accout has warned her of an increase in the cost of tarka fro her blood pressure she has lost 10 pounds on a low calorie diet  Preventive Screening-Counseling & Management  Alcohol-Tobacco     Smoking Status: never     Tobacco Counseling: not indicated; no tobacco use  Problems Prior to Update: 1)  Skin Rash  (ICD-782.1) 2)  Fatigue  (ICD-780.79) 3)  Family History of Cad Female 1st Degree Relative <50  (ICD-V17.3) 4)  Symptom, Swelling, Abdominal, Generalized   (ICD-789.37) 5)  Dependent Edema, Legs  (ICD-782.3) 6)  Nephrolithiasis, Hx of  (ICD-V13.01) 7)  Coronary Artery Disease  (ICD-414.00) 8)  Anxiety  (ICD-300.00) 9)  Glaucoma Nos  (ICD-365.9) 10)  Hypertension  (ICD-401.9) 11)  Hyperlipidemia  (ICD-272.4) 12)  Gerd  (ICD-530.81)  Medications Prior to Update: 1)  Isosorbide Mononitrate Cr 30 Mg Tb24 (Isosorbide Mononitrate) .... Once Daily 2)  Tarka 2-180 Mg Tbcr (Trandolapril-Verapamil Hcl) .... Once Daily 3)  Alprazolam 0.25 Mg  Tabs (Alprazolam) .... One Two To Three Times Per Day As Needed 4)  Betimol 0.5 %  Soln (Timolol) .... As Directed 5)  Adult Aspirin Low Strength 81 Mg  Tbdp (Aspirin) .... Once Daily 6)  Vytorin 10-20 Mg  Tabs (Ezetimibe-Simvastatin) .... Once Daily 7)  Dolgic Plus 50-750-40 Mg Tabs (Butalbital-Apap-Caffeine) .Marland Kitchen.. 1 Every 4-6 As Needed Headache 8)  Triamterene-Hctz 37.5-25 Mg Caps (Triamterene-Hctz) .Marland Kitchen.. 1 By Mouth Daily As Directed 9)  Triamcinolone Acetonide 0.5 % Crea (Triamcinolone Acetonide) .... Use As Directed 10)  Fish Oil Concentrate 1000 Mg Caps (Omega-3 Fatty Acids) .... Two By Mouth Two Times A Day 11)  Zithromax Z-Pak 250 Mg Tabs (Azithromycin) .... As Directed 12)  Valtrex 1 Gm Tabs (Valacyclovir Hcl) .... One By Mouth Three Times A Day X  7 Days 13)  Hydrocodone-Acetaminophen 5-325 Mg Tabs (Hydrocodone-Acetaminophen) .... One By Mouth Three Times A Day Prn 14)  Celexa 20 Mg Tabs (Citalopram Hydrobromide) .... One By Mouth Daily  Current Medications (verified): 1)  Isosorbide Mononitrate Cr 30 Mg Tb24 (Isosorbide Mononitrate) .... Once Daily 2)  Tarka 2-180 Mg Tbcr (Trandolapril-Verapamil Hcl) .... Once Daily 3)  Alprazolam 0.25 Mg  Tabs (Alprazolam) .... One Two To Three Times Per Day As Needed 4)  Betimol 0.5 %  Soln (Timolol) .... As Directed 5)  Adult Aspirin Low Strength 81 Mg  Tbdp (Aspirin) .... Once Daily 6)  Vytorin 10-20 Mg  Tabs (Ezetimibe-Simvastatin) .... Once Daily 7)   Triamterene-Hctz 37.5-25 Mg Caps (Triamterene-Hctz) .Marland Kitchen.. 1 By Mouth Daily As Directed 8)  Triamcinolone Acetonide 0.5 % Crea (Triamcinolone Acetonide) .... Use As Directed  Allergies (verified): No Known Drug Allergies  Directives (verified): 1)  Has A Living Will and Husband Is [facility] For Health Care   Past History:  Family History: Last updated: 18-Jul-2007 father died of MI at 109 mother died from MI at 63 Family History of CAD Female 1st degree relative <50 brothers  Social History: Last updated: 07-18-2007 Retired Married Never Smoked  Risk Factors: Smoking Status: never (06/22/2010)  Past medical, surgical, family and social histories (including risk factors) reviewed, and no changes noted (except as noted below).  Past Medical History: Reviewed history from 03/14/2007 and no changes required. GERD Hyperlipidemia Hypertension Glaucoma 365.9 Anxiety Coronary artery disease with catheterization in 2002 Nephrolithiasis, hx of status post surgical intervention  Past Surgical History: Reviewed history from 03/14/2007 and no changes required. ventral hernia repair incisional hernia repair herniorrhaphies multiple in [phone] hemicolection in 1999 during exploratory surgery for abd pain bilateral lens implants  Family History: Reviewed history from 07-18-2007 and no changes required. father died of MI at 2 mother died from MI at 46 Family History of CAD Female 1st degree relative <50 brothers  Social History: Reviewed history from 2007-07-18 and no changes required. Retired Married Never Smoked  Review of Systems       Flu Vaccine Consent Questions     Do you have a history of severe allergic reactions to this vaccine? no    Any prior history of allergic reactions to egg and/or gelatin? no    Do you have a sensitivity to the preservative Thimersol? no    Do you have a past history of Guillan-Barre Syndrome? no    Do you currently have an acute febrile  illness? no    Have you ever had a severe reaction to latex? no    Vaccine information given and explained to patient? yes    Are you currently pregnant? no    Lot Number:[id number]   Exp Date:02/11/2011   Site Given  Left Deltoid IM   Physical Exam  General:  Well-developed,well-nourished,in no acute distress; alert,appropriate and cooperative throughout examination Head:  normocephalic and atraumatic.   Eyes:  pupils equal and pupils round.   Ears:  R ear normal and L ear normal.   Lungs:  normal respiratory effort and normal breath sounds.   Heart:  normal rate and regular rhythm.   Abdomen:  soft, non-tender, and normal bowel sounds.   Msk:  normal ROM and no joint tenderness.   Neurologic:  alert & oriented X3 and finger-to-nose normal.   Skin:  Intact without suspicious lesions or rashes Cervical Nodes:  No lymphadenopathy noted Axillary Nodes:  No palpable lymphadenopathy Inguinal Nodes:  No significant adenopathy  Psych:  Cognition and judgment appear intact. Alert and cooperative with normal attention span and concentration. No apparent delusions, illusions, hallucinations   Impression & Recommendations:  Problem # 1:  HYPERTENSION (ICD-401.9)  Her updated medication list for this problem includes:    Tarka 2-180 Mg Tbcr (Trandolapril-verapamil hcl) ..... Once daily    Triamterene-hctz 37.5-25 Mg Caps (Triamterene-hctz) .Marland Kitchen... 1 by mouth daily as directed  Orders: EKG w/ Interpretation (93000) TLB-BMP (Basic Metabolic Panel-BMET) (80048-METABOL)  BP today: 134/80 Prior BP: 130/80 (10/19/2009)  Prior 10 Yr Risk Heart Disease: N/A (06/28/2007)  Labs Reviewed: K+: 4.4 (06/15/2009) Creat: : 0.9 (06/15/2009)   Chol: 122 (06/15/2009)   HDL: 43.70 (06/15/2009)   LDL: 68 (08/18/2008)   TG: 154 (08/18/2008)  Problem # 2:  HYPERLIPIDEMIA (ICD-272.4) moniter today Her updated medication list for this problem includes:    Vytorin 10-20 Mg Tabs (Ezetimibe-simvastatin)  ..... Once daily  Orders: TLB-Lipid Panel (80061-LIPID) TLB-TSH (Thyroid Stimulating Hormone) (84443-TSH)  Labs Reviewed: SGOT: 24 (08/18/2008)   SGPT: 20 (08/18/2008)  Lipid Goals: Chol Goal: 200 (09/05/2007)   HDL Goal: 40 (09/05/2007)   LDL Goal: 100 (09/05/2007)   TG Goal: 150 (09/05/2007)  Prior 10 Yr Risk Heart Disease: N/A (06/28/2007)   HDL:43.70 (06/15/2009), 34.7 (08/18/2008)  LDL:68 (08/18/2008), 77 (08/29/2007)  Chol:122 (06/15/2009), 133 (08/18/2008)  Trig:154 (08/18/2008), 126 (08/29/2007)  Problem # 3:  GERD (ICD-530.81)  stable Labs Reviewed: Hgb: 13.3 (08/09/2006)   Hct: 39.1 (08/09/2006)  Orders: TLB-CBC Platelet - w/Differential (85025-CBCD) Venipuncture (30865)  Problem # 4:  PHYSICAL EXAMINATION (ICD-V70.0)  Td Booster: Tdap (06/22/2010)   Flu Vax: Fluvax 3+ (06/22/2010)   Chol: 122 (06/15/2009)   HDL: 43.70 (06/15/2009)   LDL: 68 (08/18/2008)   TG: 154 (08/18/2008) TSH: 2.26 (08/29/2007)    Discussed using sunscreen, use of alcohol, drug use, self breast exam, routine dental care, routine eye care, schedule for GYN exam, routine physical exam, seat belts, multiple vitamins, osteoporosis prevention, adequate calcium intake in diet, recommendations for immunizations, mammograms and Pap smears.  Discussed exercise and checking cholesterol.  Discussed gun safety, safe sex, and contraception.  Orders: Medicare -1st Annual Wellness Visit [phone])  Complete Medication List: 1)  Isosorbide Mononitrate Cr 30 Mg Tb24 (Isosorbide mononitrate) .... Once daily 2)  Tarka 2-180 Mg Tbcr (Trandolapril-verapamil hcl) .... Once daily 3)  Alprazolam 0.25 Mg Tabs (Alprazolam) .... One two to three times per day as needed 4)  Betimol 0.5 % Soln (Timolol) .... As directed 5)  Adult Aspirin Low Strength 81 Mg Tbdp (Aspirin) .... Once daily 6)  Vytorin 10-20 Mg Tabs (Ezetimibe-simvastatin) .... Once daily 7)  Triamterene-hctz 37.5-25 Mg Caps (Triamterene-hctz) .Marland Kitchen.. 1 by mouth  daily as directed 8)  Triamcinolone Acetonide 0.5 % Crea (Triamcinolone acetonide) .... Use as directed  Other Orders: Tdap => 29yrs IM (62952) Admin 1st Vaccine (84132) Flu Vaccine 71yrs + MEDICARE PATIENTS (G4010) Administration Flu vaccine - MCR (G0008) TLB-Hepatic/Liver Function Pnl (80076-HEPATIC)  Patient Instructions: 1)  Please schedule a follow-up appointment in 4 months. 2)  I have provided you with a copy of your personalized plan for preventive services. Please take the time to review along with your updated medication list.   Orders Added: 1)  EKG w/ Interpretation [93000] 2)  Tdap => 70yrs IM [90715] 3)  Admin 1st Vaccine [90471] 4)  Flu Vaccine 52yrs + MEDICARE PATIENTS [Q2039] 5)  Administration Flu vaccine - MCR [G0008] 6)  TLB-Lipid Panel [80061-LIPID] 7)  TLB-Hepatic/Liver Function Pnl [80076-HEPATIC] 8)  TLB-BMP (  Basic Metabolic Panel-BMET) [80048-METABOL] 9)  TLB-TSH (Thyroid Stimulating Hormone) [84443-TSH] 10)  TLB-CBC Platelet - w/Differential [85025-CBCD] 11)  Venipuncture [16109] 12)  Medicare -1st Annual Wellness Visit [G0438] 13)  Est. Patient Level IV [60454]    Immunizations Administered:  Tetanus Vaccine:    Vaccine Type: Tdap    Site: right deltoid    Mfr: GlaxoSmithKline    Dose: 0.5 ml    Route: IM    Given by: Willy Eddy, LPN    Exp. Date: 06/02/2012    Lot #: [id number]    VIS given: 07/01/08 version given June 22, 2010.  Prevention & Chronic Care Immunizations   Influenza vaccine: Fluvax 3+  (06/22/2010)   Influenza vaccine due: 04/15/2011    Tetanus booster: 06/22/2010: Tdap   Tetanus booster due: 06/22/2020    Pneumococcal vaccine: Not documented    H. zoster vaccine: Not documented  Colorectal Screening   Hemoccult: Not documented    Colonoscopy: Not documented  Other Screening   Pap smear: Not documented    Mammogram: Not documented    DXA bone density scan: Not documented   Smoking status: never   (06/22/2010)  Lipids   Total Cholesterol: 122  (06/15/2009)   Lipid panel action/deferral: Lipid Panel ordered   LDL: 68  (08/18/2008)   LDL Direct: 62.7  (06/15/2009)   HDL: 43.70  (06/15/2009)   Triglycerides: 154  (08/18/2008)    SGOT (AST): 24  (08/18/2008)   BMP action: Ordered   SGPT (ALT): 20  (08/18/2008)   Alkaline phosphatase: 85  (08/18/2008)   Total bilirubin: 0.7  (08/18/2008)    Lipid flowsheet reviewed?: Yes   Progress toward LDL goal: At goal  Hypertension   Last Blood Pressure: 134 / 80  (06/22/2010)   Serum creatinine: 0.9  (06/15/2009)   BMP action: Ordered   Serum potassium 4.4  (06/15/2009)    Hypertension flowsheet reviewed?: Yes   Progress toward BP goal: At goal  Self-Management Support :    Patient will work on the following items until the next clinic visit to reach self-care goals:     Medications and monitoring: take my medicines every day  (06/22/2010)     Eating: eat more vegetables  (06/22/2010)     Activity: take a 30 minute walk every day, take the stairs instead of the elevator, park at the far end of the parking lot, join a walking program  (06/22/2010)    Hypertension self-management support: Not documented    Lipid self-management support: Not documented    Nursing Instructions: Give Pneumovax today   Appended Document: Orders Update    Clinical Lists Changes  Orders: Added new Service order of Pneumococcal Vaccine (29562) - Signed Added new Service order of Admin 1st Vaccine (13086) - Signed Observations: Added new observation of PNEUMOVAXLOT: 1258aa (06/22/2010 12:16) Added new observation of PNEUMOVAXEXP: 12/06/2011 (06/22/2010 12:16) Added new observation of PNEUMOVAXBY: Willy Eddy, LPN (57/84/6962 12:16) Added new observation of PNEUMOVAXRTE: IM (06/22/2010 12:16) Added new observation of PNEUMOVAXDOS: 0.5 ml (06/22/2010 12:16) Added new observation of PNEUMOVAXMFR: Merck (06/22/2010 12:16) Added new  observation of PNEUMOVAXSIT: left deltoid (06/22/2010 12:16) Added new observation of PNEUMOVAX: Pneumovax (Medicare) (06/22/2010 12:16)       Immunizations Administered:  Pneumonia Vaccine:    Vaccine Type: Pneumovax (Medicare)    Site: left deltoid    Mfr: Merck    Dose: 0.5 ml    Route: IM    Given by: Willy Eddy, LPN  Exp. Date: 12/06/2011    Lot #: [id number]

## 2010-09-15 NOTE — Assessment & Plan Note (Signed)
Summary: uri with husband/bmw   Vital Signs:  Patient profile:   65 year old female O2 Sat:      93 % Temp:     99.4 degrees F Pulse rate:   84 / minute BP sitting:   130 / 80  (left arm)  Vitals Entered By: Pura Spice, RN (July 20, 2010 1:30 PM) CC: hoarse coughing non productive   Primary Care Provider:  Stacie Glaze MD  CC:  hoarse coughing non productive.  History of Present Illness: Patient presents to clinic as a workin for evaluation of possible uri signs. Notes 4-5 day h/o nasal congestion, clear rhinorrhea and post nasal drip. Denies productive cough, chest pain, dyspnea, fever or chills. No alleviating or exacerbating factors.  Allergies: No Known Drug Allergies  Review of Systems      See HPI General:  Denies chills, fever, malaise, and sweats. ENT:  Complains of hoarseness, nasal congestion, and postnasal drainage; denies earache. CV:  Denies chest pain or discomfort, difficulty breathing at night, difficulty breathing while lying down, and shortness of breath with exertion. Resp:  Complains of cough; denies chest discomfort, shortness of breath, and sputum productive.  Physical Exam  General:  Well-developed,well-nourished,in no acute distress; alert,appropriate and cooperative throughout examination Head:  Normocephalic and atraumatic without obvious abnormalities. No apparent alopecia or balding. Eyes:  vision grossly intact and pupils equal.   Ears:  External ear exam shows no significant lesions or deformities.  Otoscopic examination reveals clear canals, tympanic membranes are intact bilaterally without bulging, retraction, inflammation or discharge. Hearing is grossly normal bilaterally. Nose:  no external deformity, no nasal discharge, no mucosal pallor, no mucosal edema, and no airflow obstruction.   Mouth:  pharynx pink and moist, no erythema, no exudates, and no tongue abnormalities.   Neck:  No deformities, masses, or tenderness noted. Lungs:   Normal respiratory effort, chest expands symmetrically. Lungs are clear to auscultation, no crackles or wheezes. Skin:  turgor normal and no rashes.     Impression & Recommendations:  Problem # 1:  URI (ICD-465.9) Suspect possible viral etiology based on symptoms and duration of illness. Recommend otc symptomatic tx and discussed in detail. Did provide abx prescription to hold in the event of failure to improve or worsening despite at least 7+days duration of symptoms.  States understanding and agreement. Followup closely if no improvement or worsening.  Complete Medication List: 1)  Isosorbide Mononitrate Cr 30 Mg Tb24 (Isosorbide mononitrate) .... Once daily 2)  Tarka 2-180 Mg Tbcr (Trandolapril-verapamil hcl) .... Once daily 3)  Alprazolam 0.25 Mg Tabs (Alprazolam) .... One two to three times per day as needed 4)  Betimol 0.5 % Soln (Timolol) .... As directed 5)  Adult Aspirin Low Strength 81 Mg Tbdp (Aspirin) .... Once daily 6)  Vytorin 10-20 Mg Tabs (Ezetimibe-simvastatin) .... Once daily 7)  Triamterene-hctz 37.5-25 Mg Caps (Triamterene-hctz) .Marland Kitchen.. 1 by mouth daily as directed 8)  Triamcinolone Acetonide 0.5 % Crea (Triamcinolone acetonide) .... Use as directed 9)  Zithromax Z-pak 250 Mg Tabs (Azithromycin) .... As directed  Patient Instructions: 1)  Get plenty of rest, drink lots of clear liquids, and use Tylenol or Ibuprofen for fever and comfort. Return in 7-10 days if you're not better:sooner if you're feeling worse. Prescriptions: ZITHROMAX Z-PAK 250 MG TABS (AZITHROMYCIN) as directed  #1 x 0   Entered and Authorized by:   Edwyna Perfect MD   Signed by:   Edwyna Perfect MD on 07/20/2010  Method used:   Print then Give to Patient   RxID:   [id number]    Orders Added: 1)  Est. Patient Level IV [78469]

## 2010-09-15 NOTE — Progress Notes (Signed)
Summary: RX questions  Phone Note Call from Patient   Caller: Patient Call For: Stacie Glaze MD Summary of Call: Pt is calling for refill of Valtrex.  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  She feels nervous, and is tearful and agitated.  Wants RX for nerves. [phone]  Shingles are healing well. Initial call taken by: Lynann Beaver CMA,  May 06, 2010 10:52 AM  Follow-up for Phone Call        per dr Lovell Sheehan -try celexa 20 1 once daily  Follow-up by: Willy Eddy, LPN,  May 06, 2010 11:03 AM    New/Updated Medications: CELEXA 20 MG TABS (CITALOPRAM HYDROBROMIDE) one by mouth daily Prescriptions: CELEXA 20 MG TABS (CITALOPRAM HYDROBROMIDE) one by mouth daily  #30 x 5   Entered by:   Lynann Beaver CMA   Authorized by:   Stacie Glaze MD   Signed by:   Lynann Beaver CMA on 05/06/2010   Method used:   Electronically to        [facility] (retail)       [address]       [place], [place]  [address]       Ph: [phone]       Fax: [phone]   RxID:   [id number]  Notified pt.

## 2010-09-15 NOTE — Progress Notes (Signed)
  Phone Note Call from Patient   Caller: Patient Call For: Stacie Glaze MD Reason for Call: Acute Illness Summary of Call: Cough and hoarse since Saturday night. No fever or other symptoms. [facility]  Initial call taken by: Lynann Beaver CMA AAMA,  July 20, 2010 10:14 AM  Follow-up for Phone Call        ov with dr hodgin  Follow-up by: Willy Eddy, LPN,  July 20, 2010 10:36 AM

## 2010-09-15 NOTE — Letter (Signed)
Summary: Medicare Questionnaire  Medicare Questionnaire   Imported By: Georgian [employer] 06/22/2010 13:37:42  _____________________________________________________________________  External Attachment:    Type:   Image     Comment:   External Document

## 2010-09-15 NOTE — Progress Notes (Signed)
Summary: sinus inf  Phone Note Call from Patient Call back at Home Phone [phone]   Caller: Patient Call For: Stacie Glaze MD Summary of Call: pt went to see her dentist Fayrene Fearing baker yesterdayhe told her she has a sinus inf please call [facility] at [phone]. Initial call taken by: Heron Sabins,  February 08, 2010 10:31 AM  Follow-up for Phone Call        per dr Lovell Sheehan- m ay have a z pack Follow-up by: Willy Eddy, LPN,  February 08, 2010 11:22 AM    New/Updated Medications: ZITHROMAX Z-PAK 250 MG TABS (AZITHROMYCIN) as directed Prescriptions: ZITHROMAX Z-PAK 250 MG TABS (AZITHROMYCIN) as directed  #6 x 0   Entered by:   Lynann Beaver CMA   Authorized by:   Stacie Glaze MD   Signed by:   Lynann Beaver CMA on 02/08/2010   Method used:   Electronically to        [facility] (retail)       [address]       [place], [place]  [address]       Ph: [phone]       Fax: [phone]   RxID:   [id number]

## 2010-10-10 ENCOUNTER — Other Ambulatory Visit: Payer: Self-pay | Admitting: Internal Medicine

## 2010-10-14 ENCOUNTER — Other Ambulatory Visit: Payer: Self-pay | Admitting: Internal Medicine

## 2010-10-21 ENCOUNTER — Encounter: Payer: Self-pay | Admitting: Internal Medicine

## 2010-10-24 ENCOUNTER — Ambulatory Visit (INDEPENDENT_AMBULATORY_CARE_PROVIDER_SITE_OTHER): Payer: Medicare Other | Admitting: Internal Medicine

## 2010-10-24 ENCOUNTER — Encounter: Payer: Self-pay | Admitting: Internal Medicine

## 2010-10-24 VITALS — BP 140/80 | HR 72 | Temp 98.2°F | Resp 14 | Ht 68.0 in | Wt 184.0 lb

## 2010-10-24 DIAGNOSIS — F411 Generalized anxiety disorder: Secondary | ICD-10-CM

## 2010-10-24 DIAGNOSIS — I10 Essential (primary) hypertension: Secondary | ICD-10-CM

## 2010-10-24 DIAGNOSIS — K219 Gastro-esophageal reflux disease without esophagitis: Secondary | ICD-10-CM

## 2010-10-24 DIAGNOSIS — R609 Edema, unspecified: Secondary | ICD-10-CM

## 2010-10-24 DIAGNOSIS — E785 Hyperlipidemia, unspecified: Secondary | ICD-10-CM

## 2010-10-24 NOTE — Assessment & Plan Note (Signed)
Stable, has been worried about sons finances

## 2010-10-24 NOTE — Assessment & Plan Note (Signed)
No side effects on the Vytorin tolerating the medication without problem last cholesterol was 4 months ago and all values were at goal including LDL HDL and total

## 2010-10-24 NOTE — Assessment & Plan Note (Signed)
No current heartburn

## 2010-10-24 NOTE — Assessment & Plan Note (Signed)
Stable blood pressure and no edema in legs   Basic metabolic panel reviewed creatinine potassium BUN stable on maxide

## 2010-10-24 NOTE — Progress Notes (Signed)
Subjective:    Patient ID: Hannah Reynolds, female    DOB: 25-Oct-1945, 65 y.o.   MRN: [id number]  HPI Comments:  Patient is a 65 year old white female who presents for followup of multiple medical problems including hypertension hyperlipidemia anxiety and gastroesophageal reflux disease she is compliant with her blood pressure medications her peripheral edema has resolved her blood pressure is in good control she denies any chest pain shortness of breath or peripheral vascular disease.   she is compliant with her cholesterol medication and her last readings were reviewed with her from her prior visit she is at goal on all levels.  Her reflux appears to be controlled off medication at this time with diet and exercise and her anxiety is moderately well-controlled although there are increased stressors in her family that resulted in some minor anxiety attacks  Coronary Artery Disease Pertinent negatives include no dizziness or shortness of breath. Risk factors include hyperlipidemia and hypertension.  Hypertension Pertinent negatives include no headaches, neck pain or shortness of breath.  Hyperlipidemia Pertinent negatives include no myalgias or shortness of breath.      Review of Systems  Constitutional: Negative for activity change, appetite change and fatigue.  HENT: Negative for ear pain, congestion, neck pain, postnasal drip and sinus pressure.   Eyes: Negative for redness and visual disturbance.  Respiratory: Negative for cough, shortness of breath and wheezing.   Gastrointestinal: Negative for abdominal pain and abdominal distention.  Genitourinary: Negative for dysuria, frequency and menstrual problem.  Musculoskeletal: Negative for myalgias, joint swelling and arthralgias.  Skin: Negative for rash and wound.  Neurological: Negative for dizziness, weakness and headaches.  Hematological: Negative for adenopathy. Does not bruise/bleed easily.  Psychiatric/Behavioral: Negative for  sleep disturbance and decreased concentration.       Past Medical History  Diagnosis Date  . GERD (gastroesophageal reflux disease)   . Hyperlipidemia   . Hypertension   . Unspecified glaucoma   . Anxiety   . CAD (coronary artery disease)   . Nephrolithiasis    Past Surgical History  Procedure Date  . Hernia repair   . Hemicolection in 1999 during exploratory surgery    . Abdominal exploration surgery   . Ventral hernia repair   . Incisional hernia repair     reports that she has never smoked. She does not have any smokeless tobacco history on file. She reports that she does not drink alcohol or use illicit drugs. family history includes Heart disease in her brother, father, and mother. Not on File  Objective:   Physical Exam  Constitutional: She is oriented to person, place, and time. She appears well-developed and well-nourished. No distress.  HENT:  Head: Normocephalic and atraumatic.  Right Ear: External ear normal.  Left Ear: External ear normal.  Nose: Nose normal.  Mouth/Throat: Oropharynx is clear and moist.  Eyes: Conjunctivae and EOM are normal. Pupils are equal, round, and reactive to light.  Neck: Normal range of motion. Neck supple. No JVD present. No tracheal deviation present. No thyromegaly present.  Cardiovascular: Normal rate, regular rhythm, normal heart sounds and intact distal pulses.   No murmur heard. Pulmonary/Chest: Effort normal and breath sounds normal. She has no wheezes. She exhibits no tenderness.  Abdominal: Soft. Bowel sounds are normal.  Musculoskeletal: Normal range of motion. She exhibits no edema and no tenderness.  Lymphadenopathy:    She has no cervical adenopathy.  Neurological: She is alert and oriented to person, place, and time. She has normal reflexes. No cranial  nerve deficit.  Skin: Skin is warm and dry. She is not diaphoretic.  Psychiatric: She has a normal mood and affect. Her behavior is normal.          Assessment &  Plan:

## 2010-10-24 NOTE — Assessment & Plan Note (Signed)
Resolved with maxide

## 2010-12-02 ENCOUNTER — Other Ambulatory Visit: Payer: Self-pay | Admitting: Internal Medicine

## 2010-12-08 ENCOUNTER — Ambulatory Visit (INDEPENDENT_AMBULATORY_CARE_PROVIDER_SITE_OTHER): Payer: Medicare Other | Admitting: Internal Medicine

## 2010-12-08 ENCOUNTER — Encounter: Payer: Self-pay | Admitting: Internal Medicine

## 2010-12-08 DIAGNOSIS — R21 Rash and other nonspecific skin eruption: Secondary | ICD-10-CM

## 2010-12-08 MED ORDER — METHYLPREDNISOLONE (PAK) 4 MG PO TABS: ORAL_TABLET | ORAL | Status: AC

## 2010-12-08 NOTE — Progress Notes (Signed)
  Subjective:    Patient ID: Hannah Reynolds, female    DOB: 1945-12-07, 65 y.o.   MRN: [id number]  HPI Pt presents to clinic for evaluation of rash. Notes two-week history of scattered rash erythematous with vesicles predominant left anterior lower leg and also the back and torso. Rash is pruritic. Attempted over-the-counter medication without improvement. Attempted triamcinolone intermittently without improvement. No oral or involvement. No spread. No alleviating or exacerbating factors. No other complaints.  Reviewed past medical history, medications and allergies.  Review of Systems see history of present illness     Objective:   Physical Exam  Nursing note and vitals reviewed. Constitutional: She appears well-developed and well-nourished. No distress.  HENT:  Head: Normocephalic and atraumatic.  Eyes: Conjunctivae are normal. No scleral icterus.  Skin: Skin is warm and dry. Rash noted. She is not diaphoretic.       Left anterior leg with erythematous vesicles. Non draining. NT. No dermatomal distribution          Assessment & Plan:

## 2010-12-08 NOTE — Assessment & Plan Note (Signed)
History and exam consistent with poison ivy contact dermatitis. Resume triamcinolone but use medication twice a day. Due to complaints of intense itching provided with Medrol Dosepak. History glaucoma and discussed avoiding this but patient feels symptoms are severe enough to warrant it. We'll attempt low-dose and short course only. Followup if no improvement or worsening.

## 2010-12-10 ENCOUNTER — Other Ambulatory Visit: Payer: Self-pay | Admitting: Internal Medicine

## 2010-12-21 ENCOUNTER — Encounter: Payer: Self-pay | Admitting: *Deleted

## 2010-12-21 ENCOUNTER — Telehealth: Payer: Self-pay | Admitting: Internal Medicine

## 2010-12-21 NOTE — Telephone Encounter (Signed)
Pt called and said that she has a personal issue that Dr Lovell Sheehan is familiar with and would like a call back from Dr Lovell Sheehan or a work in appt with Dr Lovell Sheehan only asap. Pt refuses to see another doctor.

## 2010-12-21 NOTE — Telephone Encounter (Signed)
Needs letter for jury duty

## 2010-12-21 NOTE — Telephone Encounter (Signed)
Letter given  

## 2010-12-30 NOTE — H&P (Signed)
NAME:  Hannah Reynolds, Hannah Reynolds                ACCOUNT NO.:  [id number]   MEDICAL RECORD NO.:  [id number]            PATIENT TYPE:   LOCATION:                                 FACILITY:   PHYSICIAN:  R. Roetta Sessions, M.D. DATE OF BIRTH:  1946/02/05   DATE OF ADMISSION:  DATE OF DISCHARGE:  LH                                HISTORY & PHYSICAL   REASON FOR ADMISSION:  To follow up colitis/colonoscopy.   HISTORY OF PRESENT ILLNESS:  Hannah Reynolds is a 65 year old Caucasian female  who was hospitalized at [facility] from around June 22 to February 06, 2006.  She was felt to have infectious colitis versus ischemic colitis. She  did respond to a 10-day course of Cipro and Flagyl.  She has done well since  discharge home.  She denies any further diarrhea.  She has had a soft,  formed, daily bowel  movement.  Denies any further rectal bleeding or  melena.  Denies any abdominal pain, fever or chills.  She will need  colonoscopy.  At this point she is quite concerned as she had an aunt that  just recently passed away from perforation due to colonoscopy in [place].  She also notes a white coating to her tongue.  She has been using [facility]  mixture at home.  She denies any problems with heartburn, indigestion,  anorexia, or early satiety, dysphagia, or odynophagia.  She has been on  Reglan 10 mg a.c. and at night since surgery in January 2007.  She has  questioned whether she can discontinue this medication.   While hospitalized she had a CT scan which showed a cystic lesion at the  junction between the body and the tail of the pancreas which was slightly  increased, previously measured 11.3 x 8.2 mm and now 15.9 x 12.1 mm.  It was  intended for her to be sent to [facility] for EUS and further  evaluation of this lesion.   CURRENT MEDICATIONS:  1.  Tarka 2/1 80 mg daily.  2.  Vytorin 10/20 mg daily.  3.  Isosorbide 30 mg daily.  4.  Aspirin 81 mg daily.  5.  Xanax 0.25 mg p.r.n.  6.   Imitrex 25 mg p.r.n.  7.  Reglan 10 mg a.c. and at night.   ALLERGIES:  DILAUDID causes vomiting.   PAST MEDICAL HISTORY:  1.  Hypertension.  2.  Glaucoma.  3.  Hyperlipidemia.  4.  Anxiety.  5.  Nonobstructive coronary artery disease with catheterization in 2002.  6.  Nephrolithiasis, status post surgical intervention.  7.  Tubal ligation.  8.  She has had a ventral hernia, incisional hernia repair, herniorrhaphies      multiple in 1998, 1999, 2001, and January 2007.  9.  She had a right hemicolectomy in 1999 during exploratory surgery for      abdominal pain.  10. She has had bilateral lens implants.   FAMILY HISTORY:  No known family history of colorectal carcinoma, liver, or  chronic GI problems.  Mother and father both with history of  MI and coronary  artery disease.   SOCIAL HISTORY:  Hannah Reynolds is on disability.  She previously was an LPN.  Denies any tobacco, alcohol or drug use.  She is married with two children.   REVIEW OF SYSTEMS:  CONSTITUTIONAL:  See HPI.  Otherwise negative review of  systems.  CARDIOVASCULAR:  She denies any chest pain or palpitations.  PULMONARY:  She denies any shortness of breath, dizziness, cough, or  hemoptysis.  GI:  See HPI.   PHYSICAL EXAMINATION:  VITAL SIGNS:  Weight 186 pounds, height 66 inches,  temperature 97.9, blood pressure 118/80, pulse 60.  GENERAL:  Hannah Reynolds is a 65 year old, well-developed, well-nourished,  Caucasian female in no acute distress.  HEENT:  Sclerae clear, nonicteric.  Conjunctivae pink.  Oropharynx:  She has  a thick, white, plaque coating to the dorsum of her entire tongue.  HEART:  Regular rate and rhythm with normal S1 and S2 without any murmurs,  clicks, rubs, or gallops.  LUNGS:  Clear to auscultation bilaterally.  ABDOMEN:  Positive bowel sounds x4.  No bruits auscultated.  Slightly obese.  She has a large midline scar which is well-healed.  Abdomen is soft,  nontender, nondistended without palpable  masses or hepatosplenomegaly.  No  rebound tenderness or guarding.  EXTREMITIES:  Without clubbing or edema bilaterally.   ASSESSMENT:  Hannah Reynolds is a 65 year old Caucasian female who is going to  need colonoscopy to follow up recent episode of colitis felt to be  infectious versus ischemic in nature.  She also has a pancreatic lesion  which will need further followup.  I believe Dr. Jena Gauss has contacted [facility]  regarding EUS.  However, will need to follow up on this. She also has oral  Candidiasis this morning from antibiotic use.   PLAN:  1.  Hannah Reynolds is requesting colonoscopy by Dr. 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  Will schedule procedure with him in the near      future.  I have discussed the procedure including risks and benefits      which include but are not limited to bleeding, infection, perforation,      drug reaction, increased __________ pain.  No aspirin for three days      prior to the procedure.  2.  Will follow up on pancreatic lesion either via EUS at [facility] or repeat      MRI.  Will discuss this further with Dr.      Karilyn Cota.  3.  She is going to begin to taper her Reglan 10 mg by one dose every two or      three days until she is discontinued.  4.  Nystatin 100,000 units per mL, 5 mL q.i.d. for two weeks.  No refill.      Nicholas Lose, N.P.      Jonathon Bellows, M.D.  Electronically Signed    KC/MEDQ  D:  02/27/2006  T:  02/27/2006  Job:  [id number]   cc:   Stacie Glaze, M.D. [facility]  [address] [place]  [place] [address]

## 2010-12-30 NOTE — H&P (Signed)
[facility]  Patient:    Hannah Reynolds, Hannah Reynolds                  MRN: [id number] Adm. Date:  [id number] Disc. Date: [id number] Attending:  Justine Null                         History and Physical  CHIEF COMPLAINT:  Ventral incisional hernia.  HISTORY:  Lluvia Gwynne is a 65 year old lady who has been bothered with more abdominal pain secondary to development of a ventral hernia.  She has previously had a hernia repair in the past but has developed a new ventral hernia.  She is admitted at this time for ventral hernia repair.  PAST MEDICAL HISTORY:  Past medical history is remarkable in that she had anesthetic experienced last time with heavy sedation with narcotic requiring reversal with Narcan over at [facility].  ALLERGIES:  She has no known allergies.  PHYSICAL EXAMINATION  GENERAL:  Physical exam reveals a normally developed lady in no acute distress.  HEENT:  Unremarkable.  CHEST:  Clear.  HEART:  Sinus rhythm without murmurs or gallops.  ABDOMEN:  A readily palpable ventral incisional hernia, particularly going off to the left side, from her previous incision.  EXTREMITIES:  Full range of motion.  NEUROLOGIC:  Alert and oriented x 3.  Motor and sensory function are grossly intact.  IMPRESSION:  Ventral incisional hernia.  PLAN:  Repair of ventral incisional hernia. DD:  04/25/00 TD:  04/26/00 Job: [id number]

## 2010-12-30 NOTE — Cardiovascular Report (Signed)
[place]  Patient:    Hannah Reynolds, Hannah Reynolds Visit Number: [id number] MRN: [id number]          Service Type: MED Location: [id number] Attending Physician:  Learta Codding Proc. Date: 04/01/01 Adm. Date:  [id number]                          Cardiac Catheterization  DATE OF BIRTH:  1946-01-18  PRIMARY PHYSICIAN:  Stacie Glaze, M.D.  PROCEDURE:  Left heart cardiac catheterization/coronary arteriography.  INDICATIONS:  Evaluate patient with unstable angina.  DESCRIPTION OF PROCEDURE:  Left heart catheterization was performed via the right femoral artery.  The artery was cannulated using an anterior wall puncture.  A #6 French arterial sheath was inserted via the modified Seldinger technique.  Preformed Judkins and a pigtail catheter were utilized.  Note, a 3.5 JL catheter was utilized to selectively cannulate the LAD.  RESULTS:  HEMODYNAMICS:  LV 141/5, AO 137/74.  CORONARY ARTERIOGRAPHY:  Left main:  The left main coronary artery was normal.  Left anterior descending:  The LAD had mid and distal luminal irregularities which were nonobstructive.  This appeared to involve the entire vessel which was a narrow caliber vessel.  There was some dilatation with intracoronary nitroglycerin suggesting a component of spasm.  Circumflex:  The circumflex was large and normal.  Right coronary artery:  The right coronary artery was a dominant vessel and normal.  LEFT VENTRICULOGRAM:  The left ventriculogram was obtained in the RAO projection.  The EF was 60% with normal wall motion.  CONCLUSION:  Nonobstructive coronary artery disease with probable mild left anterior descending spasm.  PLAN:  The patient will be managed with the addition of nitrates to her existing calcium channel blockers.  I will start with Imdur 30 mg.  This can be maximized over time to 120 mg as tolerated. DD:  04/01/01 TD:  04/01/01 Job: [id number]

## 2010-12-30 NOTE — Discharge Summary (Signed)
NAME:  Hannah Reynolds, Hannah Reynolds NO.:  [id number]   MEDICAL RECORD NO.:  [id number]          PATIENT TYPE:  INP   LOCATION:  A219                          FACILITY:  [facility]   PHYSICIAN:  Hanley Hays. Dechurch, M.D.DATE OF BIRTH:  11/22/1945   DATE OF ADMISSION:  02/01/2006  DATE OF DISCHARGE:  LH                                 DISCHARGE SUMMARY   DIAGNOSES:  1.  Gastroenteritis with sigmoid colitis, improving.  2.  Cystic mass head of uncinate process of the pancreas without significant      change from December 2006 on CT.  Followup MRI in 4 months with further      evaluation as indicated.  3.  History of hypertension, stable on current regimen.  4.  Dyslipidemia.  5.  Anxiety, stable.   HOSPITAL COURSE:  The patient is a 65 year old female followed by Dr. Darryll Capers in [place] who developed sudden onset of nausea, vomiting and  diarrhea.  She sequentially had bloody stools and presented to the emergency  room and admitted to the hospital.  A CAT scan of her abdomen revealed wall  thickening and inflammatory changes of the left colon.  Incidental finding  of a cystic lesion at the head of pancreas was stable to slightly  increased in size compared with August 08, 2005, and an MRI was  recommended.  An MRI of the abdomen revealed similar findings in the colon  with a cystic lobulated lesion within the pancreatic head and uncinate  process measuring 1.3 x 0.9 transverse and 2.1 cephalocaudad on MRI.  There  was no pancreatic ductal dilatation or evidence of nodularity.  Gallbladder  was normal without biliary dilatation.  It did not demonstrate any  aggressive features, although it may be slightly enlarged from December 2006  on CT, and exam in 4-6 months was recommended to assess stability.  This was  discussed with the patient and she will need an outpatient MRI per her  primary care physician.  The patient was tolerating a low residue diet.  She  had no abdominal  pain.  Stools are returned to baseline.  No bleeding over  the last 24 hours.  Her hemoglobin stayed stable.  Initial hemoglobin was 13  at the time of admission, 11 at the time of discharge.  Her electrolytes  were stable.  Blood pressures were well controlled.   She is discharged to home on these medications:  1.  Tarka 2/180 at bedtime.  2.  Vytorin 10 mg at bedtime.  3.  Aspirin 81 mg daily.  4.  Isosorbide 25 mg daily.  5.  Betimol ophthalmic OU daily.  6.  Xanax 0.25 q.6h. p.r.n.  7.  Cipro 500 mg b.i.d. and Flagyl 500 mg t.i.d. to complete a 10-day      course.   She will follow up with Dr. Jena Gauss in 3 weeks and Dr. Lovell Sheehan as scheduled.  At the time of discharge she is awake, alert, pleasant, no distress.  Abdomen is soft with active bowel sounds, nontender.  Extremities without  clubbing, cyanosis, or edema.  Her lungs are clear to auscultation.  Heart  is regular.  She is discharged to home in stable condition.      Hanley Hays [name], M.D.  Electronically Signed     FED/MEDQ  D:  02/05/2006  T:  02/05/2006  Job:  [id number]   cc:   Stacie Glaze, M.D. [facility]  [address] [place]  [place] [address]

## 2010-12-30 NOTE — H&P (Signed)
[facility]  Patient:    Hannah Reynolds, Hannah Reynolds                         MRN: [id number] Adm. Date:  03/29/00 Attending:  Justine Null, M.D. [facility]                         History and Physical  REASON FOR ADMISSION:  Chest pain.  HISTORY OF PRESENT ILLNESS:  The patient is a 65 year old woman recently discharged from the hospital after undergoing repair of a ventral hernia, and her hospital course was complicated by left lower lobe pneumonia.  The patient was discharged home on Augmentin and Xanax.  She now reports one day of intermittent substernal chest pain, which is not near her area of pneumonia at the left lower lobe.  She states also that the chest pain is unrelated to cough and to deep breathing.  The chest pain is occasionally associated with shortness of breath and fever and is moderate in intensity.  She is unable to cite any relieving factor.  She states that the chest pain is not exertional.  PAST MEDICAL HISTORY, FAMILY HISTORY, SOCIAL HISTORY:  No change from recent admission.  REVIEW OF SYSTEMS:  Denies the following:  Nausea, vomiting, loss of consciousness, skin rash, and palpitations.  PHYSICAL EXAMINATION:  VITAL SIGNS:  Blood pressure is 130/64, heart rate is 94, respiratory rate 24, and temperature is 98 degrees.  GENERAL:  No distress.  SKIN:  Not diaphoretic.  HEENT:  The head is atraumatic, sclerae nonicteric, pharynx clear.  NECK:  Supple.  CHEST:  Clear to auscultation except for left lower lung field rales.  CARDIOVASCULAR:  No JVD, no edema.  Regular rate and rhythm.  No murmur.  ABDOMEN:  There is a healing surgical scar.  It is nontender.  There is no erythema.  There is no drainage.  No hepatosplenomegaly.  BREASTS, GYNECOLOGIC, RECTAL:  Not done at this time due to patient condition and not relevant.  EXTREMITIES:  No obvious deformity of the joints.  NEUROLOGIC:  Alert, well, and oriented.  Moves all  fours.  LABORATORY DATA:  Chest x-ray:  Slight improvement in left lower lobe pneumonia.  Electrocardiogram shows minimal abnormalities, no change from last month.  IMPRESSION: 1. Left lower lobe pneumonia, resolving. 2. Chest pain, which appears to be unrelated to the above.  PLAN: 1. Antibiotics. 2. Sublingual nitroglycerin as needed for chest pain. 3. Morphine if sublingual nitroglycerin does not help. 4. Telemetry. 5. Check CPKs. 6. Persantine Cardiolite. 7. I discussed code status with patient.  She states she wants to be full code    but did not want to be started nor remain on artificial life support    systems if there was not a reasonable chance for functional recovery. DD:  03/29/00 TD:  03/30/00 Job: [id number]

## 2010-12-30 NOTE — Consult Note (Signed)
NAME:  Hannah Reynolds, Hannah Reynolds                ACCOUNT NO.:  [id number]   MEDICAL RECORD NO.:  [id number]          PATIENT TYPE:  INP   LOCATION:  A219                          FACILITY:  [facility]   PHYSICIAN:  R. Roetta Sessions, M.D. DATE OF BIRTH:  Dec 26, 1945   DATE OF CONSULTATION:  02/02/2006  DATE OF DISCHARGE:                                   CONSULTATION   REASON FOR CONSULTATION:  Colitis.   REQUESTING PHYSICIAN:  [facility]   HISTORY OF PRESENT ILLNESS:  Patient is a 65 year old Caucasian female who  presented with acute onset crampy abdominal pain with bloody diarrhea and a  single episode of vomiting.  Yesterday she had cheese pizza at a restaurant  around noon.  She noted it was very greasy but otherwise she felt fine.  Later that evening around 6:00 she had strawberry yogurt at home and an hour  later she started having severe abdominal cramps.  She had two loose stools  and subsequent stools were mostly bloody.  She had two of these at home and  one after she came to the hospital.  She describes it as large volume,  bright red blood per rectum.  She had single episode of vomiting at home  which was the yogurt she had eaten.  This morning her abdominal cramping has  resolved and she has abdominal soreness.  She has no bowel movements this  morning.  She has felt a little warm, but no documented fever that has been  __________.  Prior to yesterday she felt fine.  She has had no problems with  her bowel movements and no blood in her stools.  No nausea or vomiting,  GERD, or abdominal pain.  She has never had a colonoscopy.  She did have a  right hemicolectomy for unclear reasons.  She says she had an exploratory  surgery and she was told afterwards that part of her right colon was  removed.  She has never had an EGD.   She had a CT which revealed abnormal wall thickening and inflammatory  changes of the colon at the distal transverse colon to the splenic flexure.  They also  compared her study to December 2006 CT and stated that a cystic  lesion at the junction between the body and the tail of the pancreas was  slightly increased, previously measured 11.3 x 8.2 mm and now 15.9 x 12.1  mm.  Notably, on report in December there was no mention of this cystic  lesion.   HOME MEDICATIONS:  1.  Tarka 2/180 mg q.h.s.  2.  Vytorin 10 mg q.h.s.  3.  Aspirin 81 mg daily.  4.  Isosorbide 25 mg daily.  5.  Xanax 0.25 mg p.r.n.  6.  Betimol one drop both eyes daily.  7.  Tylenol extra strength p.r.n.   ALLERGIES:  No known drug allergies.   PAST MEDICAL HISTORY:  1.  Hypertension.  2.  Glaucoma.  3.  Hyperlipidemia.  4.  Anxiety.  5.  Non-obstructive coronary artery disease with questionable mild LAD spasm  on catheterization 2002.  6.  Nephrolithiasis status post surgery.   PAST SURGICAL HISTORY:  1.  Tubal ligation.  2.  She has had ventral hernia/incisional hernia repair multiple times      initially back in 1998, again in 1999, 2001, and recently in January      2007.  3.  In 1999 she also had a right hemicolectomy for unclear reasons.  This      was at time of an exploratory surgery for abdominal pain.  4.  She had bilateral lens implants.   FAMILY HISTORY:  Negative for colorectal cancer, pancreatic cancer, or other  chronic GI illnesses.  Mother and father both had MIs.   SOCIAL HISTORY:  She is on disability.  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  Denies any tobacco history.  No alcohol use.  She is married with two children.   REVIEW OF SYSTEMS:  No weight loss.  CARDIOPULMONARY:  No chest pain or  shortness of breath.   PHYSICAL EXAMINATION:  VITAL SIGNS:  Temperature 98.8, pulse 76,  respirations 18, blood pressure 120/54, weight 185, height 66 inches.  GENERAL:  Pleasant, mildly obese Caucasian female in no acute  distress.  SKIN:  Warm and dry.  No jaundice.  HEENT:  Conjunctivae are pink.  Sclerae non-icteric.  Oropharyngeal mucosa  moist and pink.  No lesions, erythema, or exudate.  No lymphadenopathy,  thyromegaly.  CHEST:  Lungs are clear to auscultation.  CARDIAC:  Regular rate and rhythm.  No murmurs, rubs, or gallops.  ABDOMEN:  Positive bowel sounds, slightly obese, but symmetrical.  Hyperactive bowel sounds.  Abdomen is soft.  Abdomen is nontender.  No  organomegaly or masses.  No rebound tenderness or guarding.  No abdominal  bruits.  EXTREMITIES:  No edema.  RECTAL:  Not done but stool was heme-positive previously since admission.   LABORATORIES:  White count was 15,400 on admission, now 17,300, hemoglobin  12.8, hematocrit 37.4, platelets 301,000.  Sodium 135, potassium 3.6, BUN 9,  creatinine 0.9, glucose 116, lactic acid 1.7.  Blood cultures pending.   IMPRESSION:  Patient is a 65 year old lady with acute onset crampy abdominal  pain, nausea, vomiting, bloody diarrhea.  She has colitis on the CT scan as  well as leukocytosis.  She has been visiting nursing home where many  residents have diarrhea.  Symptoms also began an hour after eating yogurt  yesterday.  Would question possibility of infectious colitis versus ischemic  colitis (she has hyperlipidemia and family history of coronary artery  disease).  Also notably she had a pancreatic cystic lesion which is slightly  enlarged from December 2006 which has not been further evaluated and this  needs to be done.   RECOMMENDATIONS:  1.  Colonoscopy.  Will review films and if it is felt she most likely has      ischemic colitis she will need to have colonoscopy while she is an      inpatient.  Otherwise, may treat for infectious colitis and she will      need to have a colonoscopy at a later date.  2.  Stool studies.  3.  Continue IV antibiotics for now. 4.  She will need to have further imaging of pancreatic cystic lesion.  5.   Further recommendations to follow.      Verlon Au  Myrtha Mantis, M.D.  Electronically Signed    LL/MEDQ  D:  02/02/2006  T:  02/02/2006  Job:  [id number]

## 2010-12-30 NOTE — Discharge Summary (Signed)
[place]  Patient:    Hannah Reynolds, Hannah Reynolds Visit Number: [id number] MRN: [id number]          Service Type: MED Location: 3700 [id number] Attending Physician:  Learta Codding Dictated by:   Rozell Searing, P.A. Adm. Date:  [id number] Disc. Date: 04/01/01   CC:         Stacie Glaze, M.D. [facility]   Referring Physician Discharge Summa  PROCEDURE:  Cardiac catheterization, April 01, 2001.  REASON FOR ADMISSION:  Patient is a 65 year old female with no previously documented coronary artery disease, who had a negative Cardiolite in August of last year, and with cardiac risk factors notable for hypertension.  She initially presented to [facility] Emergency Room for evaluation of chest discomfort described as a tightness with radiation down the left upper extremity; she also noted associated diaphoresis, dyspnea, nausea, and vomiting.  Following stabilization, she was transferred to [facility] for further evaluation and management.  LABORATORY DATA:  CBC on admission notable for elevated WBC at 13.6 -- followup 10.0.  Electrolytes and renal function within normal limits.  Normal liver enzymes.  CPK-MB negative x 3; troponin I less than 0.01 (x 2).  Lipid profile:  Total cholesterol 186, triglyceride 221, HDL 30, LDL 112.  Admission CXR:  Mild bronchitic changes; no acute changes.  Acute abdominal series:  No obstruction/free air.  HOSPITAL COURSE:  Following transfer from [facility], patient was placed on Lovenox and beta blockers for management of acute coronary syndrome, with plans to proceed with diagnostic coronary angiogram.  Serial cardiac enzymes were negative for myocardial infarction.  Cardiac catheterization, performed April 01, 2001 by Dr. Rollene Rotunda (see report for full details), revealed nonobstructive CAD with evidence of mild LAD vasospasm.  Left ventricle was within normal limits.  Dr. Antoine Poche recommended management with  nitrates and calcium channel blocker for possible coronary vasospasm.  MEDICATION ADJUSTMENTS THIS ADMISSION:  Addition of Imdur, Lipitor and sublingual nitroglycerin.   MEDICATIONS AT DISCHARGE: 1. Imdur 30 mg q.d. 2. Tarka 2/180 mg q.d. 3. Aspirin 81 mg q.d. 4. Lipitor 10 mg q.d. 5. Estrogen replacement as directed. 6. Nitrostat as directed.  DISCHARGE INSTRUCTIONS:  Refrain from heavy lifting/driving x 2 days; maintain low-fat/-cholesterol diet; call office if there is any swelling/bleeding from groin.  FOLLOWUP:  Patient is scheduled to follow up with Dr. Lewayne Bunting on Wednesday, April 24, 2001, at 4:45 p.m.  The patient is also instructed to call and schedule a followup appointment with her primary care physician, Dr. Stacie Glaze, in the ensuing few weeks.  DISCHARGE DIAGNOSES: 1. Nonobstructive coronary artery disease.    a. Question left anterior descending coronary vasospasm.    b. Normal left ventricular function. 2. Dyslipidemia.    a. Low HDL. 3. History of hypertension. 4. Transient leukocytosis. DD:  04/01/01 TD:  04/01/01 Job: [id number]

## 2010-12-30 NOTE — Discharge Summary (Signed)
[facility]  Patient:    BLESSIN, KANNO                  MRN: [id number] Adm. Date:  [id number] Disc. Date: [id number] Attending:  Justine Null                           Discharge Summary  DISCHARGE MEDICATIONS: 1. Cardizem CD 300 mg, hold. 2. Hydrochlorothiazide, hold. 3. Augmentin 875 b.i.d. 4. Continue hormonal replacement.  ADMISSION DIAGNOSIS:  Ventral hernia.  DISCHARGE DIAGNOSIS:  Status post repair.  PROCEDURES:  March 07, 2000, laparotomy, complete enterolysis with ventral hernia repair with mesh.  COMPLICATIONS:  Ileus and pneumonia.  HOSPITAL COURSE:  Ms. Wonder underwent laparotomy as described above. Postoperatively, she had a little trouble with heavy sedation with her narcotics, as well as some nausea.  Initially, she began getting along fairly well, but then reported by Dr. Zachery Dakins to have had some ileus and pneumonia, which required treatment with Zosyn.  I was away, and she was seen by my partner, so I do not have a full recollection of the events.  Nevertheless, it did delay her discharge until she was ready to go on March 23, 2000. DD:  04/13/00 TD:  04/14/00 Job: [id number]

## 2010-12-30 NOTE — Group Therapy Note (Signed)
NAME:  Hannah Reynolds, Hannah Reynolds NO.:  [id number]   MEDICAL RECORD NO.:  [id number]          PATIENT TYPE:  INP   LOCATION:  A219                          FACILITY:  [facility]   PHYSICIAN:  Osvaldo Shipper, MD     DATE OF BIRTH:  February 10, 1946   DATE OF PROCEDURE:  02/04/2006  DATE OF DISCHARGE:                                   PROGRESS NOTE   SUBJECTIVE:  The patient mentioned that she had one episode of diarrhea at 1  o'clock yesterday; however, then this morning after she had apple juice, she  had a large watery bowel movement and actually associated with incontinence.  She could not make it to the bathroom.  Finally when she did make it to the  bathroom, it was brown watery with reddish tinge to it.  Has had some  cramping pain in her abdomen.  Otherwise, no nausea or emesis.  She feels  better compared to when she presented to the hospital.  She is concerned  about the recurrence of her diarrhea, though.   OBJECTIVE:  The patient remains afebrile, heart rate in the 60s, respiratory  rate 18, blood pressure 122/59, saturation 94% on room air.  LUNGS:  Reveal some crackles at the right base which clear up with her  coughing.  CARDIOVASCULAR:  S1, S2 is normal, regular.  No murmurs appreciated.  ABDOMEN:  Soft.  There is diffuse vague discomfort but no real tenderness.  No mass or organomegaly is present.  EXTREMITIES:  Without edema.  Peripheral pulses are palpable.   LABORATORY DATA:  Unfortunately, no labs available today.  She did have one  occult blood testing for her stools which was positive.  Cultures are all  negative so far.  These include C. difficile, WBC in the stool.   ASSESSMENT AND PLAN:  1.  Gastrointestinal.  The patient most likely has gastroenteritis, possibly      dysentery.  She is on p.o. antibiotics including Cipro and Flagyl.  She      had improved with respect to her diarrhea yesterday and was started on      clear liquids, however, seems to have had  a recurrence of her bloody      bowel movement this morning.  We will await the recommendations from GI      at this point.  Will check a TSH, ESR, amylase, lipase, CBC and CMP stat      now.  I think we might continue her clear liquid diet unless these      recommendations change by GI.  2.  Pancreatic mass.  She did have an MRI yesterday which shows cystic      lesion in the head and uncinate process of the pancreas which has      features most compatible with an intraductal papillary mucinous tumor of      the pancreas.  There was no aggressive features noted but it was thought      it was maybe slightly enlarged from December 2006.  Radiologist      recommended followup in 4-6  months.  Distal colitis was also noted on      the MRI.  We will again defer follow up of this issue to GI for now.  I      will check an amylase and lipase level on this patient.  She does not      have any symptomatology associated with this incidental finding.  Of      course, considering her diarrhea, there was also a question about      VIPoma, which does not appear to be in this case.  3.  History of hypertension, stable.  She is continued on her medications.  4.  History of dyslipidemia is also stable.  5.  Mild hypoxia.  The patient has COPD based on her x-ray from June 21.      Her saturation is running 90-94% on room air.  She was put on oxygen by      nursing staff.  This time, we will not be very aggressive in evaluating      this      further.  I think she may go back to her primary doctor for further      workup of these issues.  6.  DVT prophylaxis with SCDs will be provided as the patient is having      blood in her stools.  She is on a PPI already.      Osvaldo Shipper, MD  Electronically Signed     GK/MEDQ  D:  02/04/2006  T:  02/04/2006  Job:  [id number]

## 2010-12-30 NOTE — H&P (Signed)
NAME:  Hannah Reynolds, CAMPI                ACCOUNT NO.:  [id number]   MEDICAL RECORD NO.:  [id number]          PATIENT TYPE:  AMB   LOCATION:  DAY                           FACILITY:  [facility]   PHYSICIAN:  Jerolyn Shin C. Katrinka Blazing, M.D.   DATE OF BIRTH:  1946/01/18   DATE OF ADMISSION:  DATE OF DISCHARGE:  LH                                HISTORY & PHYSICAL   HISTORY OF PRESENT ILLNESS:  The patient is a 65 year old female with  history of recurrent incisional hernia.  The patient initially had a  laparotomy with repair of hernia in 1998.  In 1999 she had a right  hemicolectomy and had a hernia recurrence in late 1999.  It was repaired at  that time primarily.  In July 2001, she had repair with an atrium mesh.  This was complicated by ileus and pneumonia.  The patient now has a painful  mass in the subumbilical midline which has recurred.  She is scheduled to  have the mesh removed and will try to use matrix mesh or use a combination  of Vicryl and Marlex mesh.   PAST MEDICAL HISTORY:  1.  She has glaucoma.  2.  Hypertension.  3.  Depression with anxiety.  4.  There is a history of non-obstructive coronary artery disease.  She had      a cardiac catheterization August 2002 which showed mild left anterior      descending vasospasm with normal left ventricular function with no      obstruction.   PAST SURGICAL HISTORY:  Tubal ligation.   MEDICATIONS:  1.  Xanax 0.25 mg t.i.d.  2.  Imdur 30 mg daily.  3.  Tarka 2/180 mg daily.  4.  Aspirin 81 mg daily.  5.  Vytorin 20 mg one half daily.  6.  Imitrex 50 mg p.r.n. for headache.   ALLERGIES:  No medication except for TETANUS TOXOID.   PHYSICAL EXAMINATION:  VITAL SIGNS:  Blood pressure 180/100, pulse 66,  respirations 20, weight 194 pounds.  HEENT:  Unremarkable.  NECK:  Supple.  No JVD, bruits, adenopathy, or thyromegaly.  CHEST:  Clear to auscultation.  HEART:  Regular rate and rhythm without murmur, gallop or rub.  ABDOMEN:  Tender mass  subumbilical midline with a large hernia.  EXTREMITIES:  No cyanosis or clubbing.  There is 1+ edema of the feet and  ankles.  NEUROLOGIC:  No focal motor, sensory or cerebellar deficit.   IMPRESSION:  1.  Recurrent incisional hernia with incarceration, status post multiple      repairs.  2.  Non-obstructive coronary artery disease.  3.  Hypertension.  4.  Glaucoma.  5.  Depression with anxiety.   PLAN:  Mesh graft repair of incarcerated recurrent incisional hernia.      Dirk Dress. Katrinka Blazing, M.D.  Electronically Signed     LCS/MEDQ  D:  08/17/2005  T:  08/18/2005  Job:  [id number]

## 2010-12-30 NOTE — Discharge Summary (Signed)
[facility]  Patient:    Hannah Reynolds, Hannah Reynolds                         MRN: [id number] Adm. Date:  03/29/00 Disc. Date: 03/31/00 Attending:  Justine Null, M.D. [facility]                           Discharge Summary  REASON FOR ADMISSION:  Chest pain.  HISTORY OF PRESENT ILLNESS:  The patient is a 65 year old woman who I admitted on March 29, 2000, for chest pain.  Please refer to my dictated history and physical for details.  HOSPITAL COURSE:  The patient was admitted and serial enzymes were negative. Chest CT did not show pulmonary emboli.  Adenosine Cardiolite was negative for ischemia.  By March 31, 2000, the patient was alert, oriented, ambulatory and eating his usual diet.  He was discharged home in good condition.  DISCHARGE MEDICATIONS: 1. Levaquin 500 mg daily. 2. Prometrium at previous dosage. 3. Estratest at previous dosage.  DISCHARGE INSTRUCTIONS: 1. Do not take Cardizem or hydrochlorothiazide. 2. No restriction on diet or activity. 3. Follow up with Dr. Everardo All in 30 days.  DISCHARGE DIAGNOSES: 1. Pneumonia, improving. 2. Noncardiogenic chest pain. DD:  04/25/00 TD:  04/27/00 Job: [id number]

## 2010-12-30 NOTE — Discharge Summary (Signed)
[place]. [facility]  Patient:    Hannah Reynolds, Hannah Reynolds Visit Number: [id number] MRN: [id number]          Service Type: MED Location: 3700 3711 [id number] Attending Physician:  Learta Codding Dictated by:   Rozell Searing, P.A. Adm. Date:  [id number]                    Referring Physician Discharge Summa  ADDENDUM:  The patient was kept for overnight observation given a complaint of lightheadedness with standing.  Orthostatic blood pressures were checked, but showed no significant changes.  However, the patient did have elevated heart rate which was evaluated as sinus tachycardia in the 120 range.  The plan was to keep her for overnight observation and treatment with hydration.  The following morning, the patient denied any chest discomfort, but continued to report cough.  A follow-up CBC showed persistent mild leukocytosis and the patient was given a prescription of Tequin and Beconase nasal spray for treatment of possible bronchitis.  Lewayne Bunting, M.D., also recommended changing her Tarka to 1/240 mg q.d. to be taken in the evening and Imdur to be taken in the morning. DD:  04/02/01 TD:  04/02/01 Job: [id number]

## 2010-12-30 NOTE — Discharge Summary (Signed)
NAME:  Hannah Reynolds, Hannah Reynolds                ACCOUNT NO.:  [id number]   MEDICAL RECORD NO.:  [id number]          PATIENT TYPE:  INP   LOCATION:  A328                          FACILITY:  [facility]   PHYSICIAN:  Dirk Dress. Katrinka Blazing, M.D.   DATE OF BIRTH:  May 26, 1946   DATE OF ADMISSION:  08/18/2005  DATE OF DISCHARGE:  01/11/2007LH                                 DISCHARGE SUMMARY   DISCHARGE DIAGNOSES:  1.  Recurrent incisional hernia.  2.  Hypertension.  3.  Mild hypoxemia.  4.  Depression.  5.  Hypokalemia.  6.  Glaucoma.   SPECIAL PROCEDURE:  Mesh graft repair of incarcerated recurrent incisional  hernia.   DISPOSITION:  The patient discharged home in stable satisfactory condition.   DISCHARGE MEDICATIONS:  1.  Imdur 30 mg daily.  2.  Potassium chloride 20 mEq twice daily.  3.  Xanax 0.25 mg 3 times daily.  4.  Reglan 10 mg a.c. and h.s.  5.  Tarka 2/180 daily.  6.  Vytorin 10/20 daily.  7.  Aspirin 81 mg daily.  8.  Tylox 1 every 6 hours as needed for pain.  9.  Combivent 2 puffs 4 times daily.  10. Oxygen 2 liters as needed.   The patient scheduled to be seen in the office on January 25.   SUMMARY:  A 65 year old female with history of recurrent incisional hernia.  She had a laparotomy with hernia repair in 1998. In 1999, she had a right  hemicolectomy and hernia recurrence in late 1999. It was repaired primarily  at that time. In July 2001, she had repair with an Atrium mesh. This repair  was complicated by ileus and pneumonia. The patient presents with painful  mass in the supraumbilical midline. It was felt that she would need to have  the mass removed and would try to use Matrix mesh and use a combination of  Vicryl ______________. Past history is positive for hypertension,  depression, nonobstructive coronary artery disease.   HOSPITAL COURSE:  The patient was admitted through day surgery and underwent  mesh graft repair of recurrent incisional hernia. The procedure itself went  uneventfully. The patient had mild ileus and mild hypoxemia in the  postoperative period. She was not febrile. She also had hypokalemia which  was treated with oral potassium. Once her intestinal function returned, she  was started on a diet which she tolerated quite well. She had evidence of  some mild volume overload that was treated with IV Lasix. To make sure that  there was no evidence of embolus, a CT scan of the chest was done. This was  negative for pulmonary embolus. By the 11th, she was doing well. She was  tolerating a diet. She was afebrile. Lungs were clear. Wound looked good.  She was discharged home in stable satisfactory condition.      Dirk Dress. Katrinka Blazing, M.D.  Electronically Signed     LCS/MEDQ  D:  10/03/2005  T:  10/04/2005  Job:  [id number]

## 2010-12-30 NOTE — Op Note (Signed)
[facility]  Patient:    Hannah Reynolds, Hannah Reynolds                  MRN: [id number] Proc. Date: 03/07/00 Adm. Date:  [id number] Attending:  Katha Cabal CC:         Stacie Glaze, M.D. [facility]                           Operative Report  CCS NUMBER:  [id number]  PREOPERATIVE DIAGNOSIS:  Ventral hernia.  POSTOPERATIVE DIAGNOSIS:  Ventral hernia, incarcerated with numerous adhesions of the small intestine.  PROCEDURE:  Laparotomy with complete small bowel enterolysis, ventral hernia repair with Atrium mesh.  SURGEON:  Thornton Park. Daphine Deutscher, M.D.  ASSISTANT:  Anselm Pancoast. Zachery Dakins, M.D.  ANESTHESIA:  PREOPERATIVE INDICATIONS:  The patient is a 65 year old lady who has had previous laparotomy and repair of a small hernia in 1998, right hemicolectomy in 1999, repair of hernia primarily in 1999, later in the year followed by recurrence.  At the time of that hospitalization in [facility] was complicated by apparent overdose of PCA morphine.  She has been having more pain in her wound and was found to attenuation and developed a hernia.  She was seen in the office and informed consent was obtained regarding repair.  DESCRIPTION OF PROCEDURE:  She was taken to OR #4 at [facility] and the abdomen was prepped with Betadine and draped sterilely.  I excised her old scar and went through that, and found a hernia sac.  She had multiple fascial defects where the running Prolene had pulled through the fascia, creating these defects and fenestrations.  I freed these up and took down the adhesions.  Inferiorly, I had to go lower and remove that to completely mobilize the small bowel out of the pelvis.  When I did that, I found a lot of matted bowel, some incorporated into the omentum that remained.  I went ahead and spent at least an hour freeing up all of these adhesions, and did this without creating any enterotomy, and just carefully taking away and taking down  the adhesions until I could run the bowel from the terminal ileum where it wound up in the transverse colon, and then proximally to the ligament of Treitz.  The abdominal contents were returned to their anatomic position.  A small remnant of omentum was left and was brought down.  The hernia itself was repaired first by going back and undermining the skin and fat to identify the fascia all around.  I took a piece of Atrium mesh which I cut the fit, and sutured it proximally, superiorly, and inferiorly, and then on the right side with through-and-through sutures.  Similarly, on the left side, I went back a similar distance and created some horizontal mattress sutures which were tied down and kept.  I then closed the defect primarily with interrupted #1 Novofil and then tied these bringing the defect together and actually repairing it.  I then threaded the sutures through the mesh and tied this down which bond the mesh down, not only to the sutures in the midline, but laterally into the ones over on the left side as well.  This bonded the mesh to the fascia.  I also put a couple of other stitches where I tacked the fascia and just taking a superficial bite.  All the other bites were taken under direct vision to avoid bowel.  When  this was done, I irrigated the wound with double antibiotic solution. The fat was then approximated with 4-0 Vicryl.  The skin was closed with staples.  An abdominal binder will be placed.  The NG tube was checked and was in good position prior to closure.  The patient seemed to tolerate the procedure well and was taken to the recovery room in satisfactory condition. DD:  03/07/00 TD:  03/09/00 Job: [id number]

## 2010-12-30 NOTE — H&P (Signed)
NAME:  Hannah Reynolds, Hannah Reynolds NO.:  [id number]   MEDICAL RECORD NO.:  [id number]          PATIENT TYPE:  INP   LOCATION:  A219                          FACILITY:  [facility]   PHYSICIAN:  Margaretmary Dys, M.D.DATE OF BIRTH:  [birth year]   DATE OF ADMISSION:  02/01/2006  DATE OF DISCHARGE:  LH                                HISTORY & PHYSICAL   PRIMARY CARE PHYSICIAN:  Patient is unassigned.   ADMITTING DIAGNOSES:  1.  Acute abdominal pain.  2.  Acute hematochezia, questionable dysentery versus acute ischemic      colitis.   HISTORY OF PRESENT ILLNESS:  Hannah Reynolds is a 65 year old Caucasian female  who presented to the emergency room with acute crampy abdominal pain.  Patient states she was doing fairly well.  She had cheese pizza for lunch at  a restaurant.  Subsequently, in the evening around 6 p.m., she had  strawberry yogurt at home and about 50 minutes later she started having  severe abdominal cramps.  She had 3 loose bowel movements which was mostly  yellowish.  The abdominal crampiness was relieved with the diarrhea.  However, after the diarrhea episode she began to see just frank blood.  She  describes a fair amount.  Also associated with some cramps.  She denies any  fever or chills, but she did feel a little warm.  Patient did not have any  nausea or vomiting.  This is an index episode of abdominal pain.  She has  never had GI bleed and has never been evaluated with a colonoscopy.   Patient said she has had surgery in the past, had part of her colon removed.   Evaluation in the emergency room revealed some diffuse abdominal tenderness  raising concern for a possible colitis.  Patient is being admitted now for  further evaluation and management.   A CT scan ordered is pending.   REVIEW OF SYSTEMS:  Ten-point review of systems otherwise negative except as  mentioned in the History of Present Illness.   PAST MEDICAL HISTORY:  1.  Hypertension.  2.   Glaucoma.  3.  Hyperlipidemia.  4.  Anxiety.  5.  Nonobstructive coronary artery disease with questionable mild LAD spasms      on catheterization in 2002.  6.  Nephrolithiasis, status post surgery.   MEDICATIONS:  1.  Tarka 2/180 mg p.o. q.h.s.  2.  Vytorin 10 mg p.o. q.h.s.  3.  Aspirin 81 mg p.o. once a day.  4.  Isosorbide 25 mg p.o. daily.  5.  Xanax 0.25 mg p.o. p.r.n.  6.  Betimol 1 drop OU daily.  7.  Tylenol Extra Strength p.r.n.   ALLERGIES:  She denies any known drug allergies.   PAST SURGICAL HISTORY:  1.  Tubal ligation.  2.  Ventral/incisional hernia with multiple revisions, the most recent by      Dr. Katrinka Blazing in January 2007.  3.  She had a right hemicolectomy for unclear reasons.  4.  Bilateral lens implants.   FAMILY HISTORY:  Negative for colorectal cancer, pancreatic cancer or any  abdominal  problems but strongly positive for coronary artery disease in both  parents.   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  She denies any tobacco history.  No alcohol use.  She is married  with 2 children.   PHYSICAL EXAMINATION:  GENERAL:  Conscious, alert, comfortable, not in acute  distress, was pleasant.  VITAL SIGNS:  Blood pressure on arrival to the emergency room was 156/87,  pulse of 59, respiration of 18, temperature 98.3 degrees Fahrenheit, oxygen  saturation 98% on room air.  Pain was 5/10.  HEENT:  Normocephalic, atraumatic.  Oral mucosa was dry.  No exudates were  noted.  NECK:  Supple, no JVD, no lymphadenopathy.  LUNGS:  Clear clinically with good air entry bilaterally.  HEART:  S1, S2 regular, no S3, S4, gallops or rubs.  ABDOMEN:  Soft, not distended.  Bowel sounds were positive.  There was mild  diffuse tenderness but no rebound or guarding.  EXTREMITIES:  No pitting or pedal edema.  GENITOURINARY:  Rectal exam done in the  emergency room was heme positive.   LABORATORY/DIAGNOSTIC DATA:  White blood cell count was 15.4, hemoglobin of  13.3, hematocrit of 39, platelet count was 318, neutrophils were 82%.  Sodium was 139, potassium 4.7, chloride of 103, CO2 of 30, glucose 143, BUN  of 15, creatinine was 1.0.  Calcium of 9.8, lactic acid was 1.7.  Blood  cultures that were done are pending.  A CT of the abdomen and pelvis is  pending.  Abdominal series shows post-surgical changes with a nonspecific  bowel gas pattern.  Chest x-ray shows chronic bronchitis.   ASSESSMENT AND PLAN:  Hannah Reynolds is a 65 year old Caucasian female who  presents to the emergency room with acute crampy abdominal pain associated  with diarrhea.  Subsequently, after 3 episodes of diarrhea patient developed  frank hematochezia.  She has had 3 episodes of the hematochezia thus far.   Patient is hemodynamically stable.  Her physical exam was really  unremarkable, but she does have some leukocytosis with left shift.  Patient  reports that she was recently in a nursing home where there has been an  outbreak of diarrhea.   PLAN:  1.  Admit patient at this time to rule out infectious colitis versus acute      ischemic colitis.  We will hydrate her.  We will start her empirically      on Cipro and Flagyl.  We will request a GI consult.  I have requested a      CT scan of her abdomen and pelvis which is pending.  2.  I will hold some of her home medications at this time; if her bleeding      continues she may potentially become hypotensive.  3.  We will give Protonix for GI prophylaxis.  4.  We will control pain with Dilaudid.  5.  I will also obtain a lactate level to rule out the possibility of      mesenteric ischemia.   I have discussed the above plan with her and she verbalized full understand.   CODE STATUS:  Full code.      Margaretmary Dys, M.D.  Electronically Signed    AM/MEDQ  D:  02/03/2006  T:  02/03/2006  Job:   [id number]

## 2010-12-30 NOTE — Op Note (Signed)
NAME:  Hannah Reynolds, Hannah Reynolds                ACCOUNT NO.:  [id number]   MEDICAL RECORD NO.:  [id number]          PATIENT TYPE:  INP   LOCATION:  A328                          FACILITY:  [facility]   PHYSICIAN:  Dirk Dress. Katrinka Blazing, M.D.   DATE OF BIRTH:  [birth year]   DATE OF PROCEDURE:  08/18/2005  DATE OF DISCHARGE:  08/24/2005                                 OPERATIVE REPORT   PREOP DIAGNOSIS:  Recurrent incisional hernia   POSTOPERATIVE DIAGNOSIS:  Recurrent incisional hernia.   PROCEDURE:  Mesh graft repair of recurrent incisional hernia.   SURGEON:  Dirk Dress. Katrinka Blazing, M.D.   DESCRIPTION:  Under general anesthesia the patient's abdomen was prepped and  draped in a sterile field. The old midline incision was opened. There was a  large hernia sac with incarcerated content.  This was pushed back into the  peritoneal cavity. Fascial margins were developed. It was elected to use a  polypropylene mesh graft for the repair. All fascial margins were debrided  back to good fascial edges. The margins were then closed using a 3 inch x 6  inch polypropylene, knitted, part-mesh graft. This was sewn with #0 Prolene.  It appeared to be a very good repair. A JP drain was placed over the mesh.  Subcutaneous tissue was closed with 3-0 Monocryl. Skin was closed with  staples. The drain was secured with 3-0 nylon. The patient tolerated the  procedure well. She was awakened from anesthesia uneventfully and  transferred to a bed and taken to the postanesthetic care unit for  monitoring.      Dirk Dress. Katrinka Blazing, M.D.  Electronically Signed     LCS/MEDQ  D:  10/03/2005  T:  10/04/2005  Job:  [id number]

## 2011-02-01 ENCOUNTER — Other Ambulatory Visit: Payer: Self-pay | Admitting: *Deleted

## 2011-02-01 MED ORDER — ALPRAZOLAM 0.5 MG PO TABS: 0.5000 mg | ORAL_TABLET | Freq: Two times a day (BID) | ORAL | Status: DC | PRN

## 2011-02-13 ENCOUNTER — Other Ambulatory Visit: Payer: 59

## 2011-02-14 ENCOUNTER — Other Ambulatory Visit (INDEPENDENT_AMBULATORY_CARE_PROVIDER_SITE_OTHER): Payer: Medicare Other

## 2011-02-14 DIAGNOSIS — E785 Hyperlipidemia, unspecified: Secondary | ICD-10-CM

## 2011-02-14 DIAGNOSIS — I10 Essential (primary) hypertension: Secondary | ICD-10-CM

## 2011-02-14 LAB — LIPID PANEL
LDL Cholesterol: 36 mg/dL (ref 0–99)
VLDL: 26.4 mg/dL (ref 0.0–40.0)

## 2011-02-14 LAB — HEPATIC FUNCTION PANEL
ALT: 17 U/L (ref 0–35)
AST: 22 U/L (ref 0–37)
Albumin: 4.3 g/dL (ref 3.5–5.2)
Alkaline Phosphatase: 76 U/L (ref 39–117)

## 2011-02-14 LAB — BASIC METABOLIC PANEL
Chloride: 109 mEq/L (ref 96–112)
GFR: 84.25 mL/min (ref >60.00)
Glucose, Bld: 90 mg/dL (ref 70–99)
Potassium: 4.9 mEq/L (ref 3.5–5.1)
Sodium: 146 mEq/L — ABNORMAL HIGH (ref 135–145)

## 2011-02-22 ENCOUNTER — Encounter: Payer: Self-pay | Admitting: Internal Medicine

## 2011-02-22 ENCOUNTER — Ambulatory Visit (INDEPENDENT_AMBULATORY_CARE_PROVIDER_SITE_OTHER): Payer: Medicare Other | Admitting: Internal Medicine

## 2011-02-22 DIAGNOSIS — I10 Essential (primary) hypertension: Secondary | ICD-10-CM

## 2011-02-22 DIAGNOSIS — E785 Hyperlipidemia, unspecified: Secondary | ICD-10-CM

## 2011-02-22 DIAGNOSIS — K219 Gastro-esophageal reflux disease without esophagitis: Secondary | ICD-10-CM

## 2011-02-22 NOTE — Progress Notes (Signed)
  Subjective:    Patient ID: Hannah Reynolds, female    DOB: [birth year], 65 y.o.   MRN: [id number]  HPI The patient is a 71 white female who presents for followup of hypertension hyperlipidemia and a history of GERD.  She is doing well with stable blood pressure she has lost weight and her cholesterol is at goal.  Lab IVs were drawn prior to her office visit including a total cholesterol HDL and LDL cholesterol which was excellent   Review of Systems  Constitutional: Negative for activity change, appetite change and fatigue.  HENT: Negative for ear pain, congestion, neck pain, postnasal drip and sinus pressure.   Eyes: Negative for redness and visual disturbance.  Respiratory: Negative for cough, shortness of breath and wheezing.   Gastrointestinal: Negative for abdominal pain and abdominal distention.  Genitourinary: Negative for dysuria, frequency and menstrual problem.  Musculoskeletal: Negative for myalgias, joint swelling and arthralgias.  Skin: Negative for rash and wound.  Neurological: Negative for dizziness, weakness and headaches.  Hematological: Negative for adenopathy. Does not bruise/bleed easily.  Psychiatric/Behavioral: Negative for sleep disturbance and decreased concentration.   Past Medical History  Diagnosis Date  . GERD (gastroesophageal reflux disease)   . Hyperlipidemia   . Hypertension   . Unspecified glaucoma   . Anxiety   . CAD (coronary artery disease)   . Nephrolithiasis    Past Surgical History  Procedure Date  . Hernia repair   . Hemicolection in 1999 during exploratory surgery    . Abdominal exploration surgery   . Ventral hernia repair   . Incisional hernia repair     reports that she has never smoked. She does not have any smokeless tobacco history on file. She reports that she does not drink alcohol or use illicit drugs. family history includes Heart disease in her brother, father, and mother. No Known Allergies     Objective:   Physical  Exam  Nursing note and vitals reviewed. Constitutional: She is oriented to person, place, and time. She appears well-developed and well-nourished. No distress.  HENT:  Head: Normocephalic and atraumatic.  Right Ear: External ear normal.  Left Ear: External ear normal.  Nose: Nose normal.  Mouth/Throat: Oropharynx is clear and moist.  Eyes: Conjunctivae and EOM are normal. Pupils are equal, round, and reactive to light.  Neck: Normal range of motion. Neck supple. No JVD present. No tracheal deviation present. No thyromegaly present.  Cardiovascular: Normal rate, regular rhythm, normal heart sounds and intact distal pulses.   No murmur heard. Pulmonary/Chest: Effort normal and breath sounds normal. She has no wheezes. She exhibits no tenderness.  Abdominal: Soft. Bowel sounds are normal.  Musculoskeletal: Normal range of motion. She exhibits no edema and no tenderness.  Lymphadenopathy:    She has no cervical adenopathy.  Neurological: She is alert and oriented to person, place, and time. She has normal reflexes. No cranial nerve deficit.  Skin: Skin is warm and dry. She is not diaphoretic.  Psychiatric: She has a normal mood and affect. Her behavior is normal.          Assessment & Plan:  She is stable on her Vytorin with excellent cholesterol results.  Her blood pressure stable on a combination of a calcium channel blocker ACE inhibitor and diuretic Renal function and liver function are stable these were reviewed with the patient

## 2011-02-22 NOTE — Patient Instructions (Signed)
May use the cortaid on the poison ivy rash

## 2011-05-19 ENCOUNTER — Ambulatory Visit (INDEPENDENT_AMBULATORY_CARE_PROVIDER_SITE_OTHER): Payer: Medicare Other | Admitting: Internal Medicine

## 2011-05-19 ENCOUNTER — Encounter: Payer: Self-pay | Admitting: Internal Medicine

## 2011-05-19 DIAGNOSIS — F411 Generalized anxiety disorder: Secondary | ICD-10-CM

## 2011-05-19 DIAGNOSIS — K219 Gastro-esophageal reflux disease without esophagitis: Secondary | ICD-10-CM

## 2011-05-19 DIAGNOSIS — R5383 Other fatigue: Secondary | ICD-10-CM

## 2011-05-19 DIAGNOSIS — E785 Hyperlipidemia, unspecified: Secondary | ICD-10-CM

## 2011-05-19 DIAGNOSIS — I10 Essential (primary) hypertension: Secondary | ICD-10-CM

## 2011-05-19 NOTE — Assessment & Plan Note (Signed)
Limits are at goal Continue medications

## 2011-05-19 NOTE — Assessment & Plan Note (Signed)
Increased anxiety of water in [place] Hx of anxiety disorder

## 2011-05-19 NOTE — Patient Instructions (Signed)
Patient was instructed to continue all medications as prescribed. To stop at the checkout desk and schedule a followup appointment  

## 2011-05-19 NOTE — Assessment & Plan Note (Signed)
stable °

## 2011-05-19 NOTE — Assessment & Plan Note (Signed)
resolved 

## 2011-05-19 NOTE — Progress Notes (Signed)
Subjective:    Patient ID: Hannah Reynolds, female    DOB: Sep 16, 1945, 65 y.o.   MRN: [id number]  Hypertension Pertinent negatives include no headaches, neck pain or shortness of breath.  Hyperlipidemia Pertinent negatives include no myalgias or shortness of breath.  Coronary Artery Disease Pertinent negatives include no dizziness or shortness of breath. Risk factors include hyperlipidemia and hypertension.  Gastrophageal Reflux She reports no abdominal pain, no coughing or no wheezing. Pertinent negatives include no fatigue.   Spider bite to left leg thigh Follow up HTN and lipids   Review of Systems  Constitutional: Negative for activity change, appetite change and fatigue.  HENT: Negative for ear pain, congestion, neck pain, postnasal drip and sinus pressure.   Eyes: Negative for redness and visual disturbance.  Respiratory: Negative for cough, shortness of breath and wheezing.   Gastrointestinal: Negative for abdominal pain and abdominal distention.  Genitourinary: Negative for dysuria, frequency and menstrual problem.  Musculoskeletal: Negative for myalgias, joint swelling and arthralgias.  Skin: Negative for rash and wound.  Neurological: Negative for dizziness, weakness and headaches.  Hematological: Negative for adenopathy. Does not bruise/bleed easily.  Psychiatric/Behavioral: Negative for sleep disturbance and decreased concentration.   Past Medical History  Diagnosis Date  . GERD (gastroesophageal reflux disease)   . Hyperlipidemia   . Hypertension   . Unspecified glaucoma   . Anxiety   . CAD (coronary artery disease)   . Nephrolithiasis    Past Surgical History  Procedure Date  . Hernia repair   . Hemicolection in 1999 during exploratory surgery    . Abdominal exploration surgery   . Ventral hernia repair   . Incisional hernia repair     reports that she has never smoked. She does not have any smokeless tobacco history on file. She reports that she does not  drink alcohol or use illicit drugs. family history includes Heart disease in her brother, father, and mother. No Known Allergies     Objective:   Physical Exam  Constitutional: She is oriented to person, place, and time. She appears well-developed and well-nourished. No distress.  HENT:  Head: Normocephalic and atraumatic.  Right Ear: External ear normal.  Left Ear: External ear normal.  Nose: Nose normal.  Mouth/Throat: Oropharynx is clear and moist.  Eyes: Conjunctivae and EOM are normal. Pupils are equal, round, and reactive to light.  Neck: Normal range of motion. Neck supple. No JVD present. No tracheal deviation present. No thyromegaly present.  Cardiovascular: Normal rate, regular rhythm, normal heart sounds and intact distal pulses.   No murmur heard. Pulmonary/Chest: Effort normal and breath sounds normal. She has no wheezes. She exhibits no tenderness.  Abdominal: Soft. Bowel sounds are normal.  Musculoskeletal: Normal range of motion. She exhibits no edema and no tenderness.  Lymphadenopathy:    She has no cervical adenopathy.  Neurological: She is alert and oriented to person, place, and time. She has normal reflexes. No cranial nerve deficit.  Skin: Skin is warm and dry. She is not diaphoretic.  Psychiatric: She has a normal mood and affect. Her behavior is normal.          Assessment & Plan:  Patient has a spider bite to her left thigh that appears to be healing it appears to have been a spider bite with an area of necrosis and cellulitis that is now resolving after the use of topical antibiotic therapy. Patient's hypertension is stable on her current medication she shows no decompensation specifically no edema or congestive  heart failure.  She does have a history of coronary artery disease but she denies any chest pain increased shortness of breath either at rest or with exertion.  She has a history of hyperlipidemia and is on Vytorin. We will monitor her lipid  and liver to assess her control and potential side effects.  She has a history of asthmatic bronchitis but she states that her rescue inhaler use is minimal at this time

## 2011-05-22 ENCOUNTER — Ambulatory Visit: Payer: 59 | Admitting: Internal Medicine

## 2011-05-26 ENCOUNTER — Telehealth: Payer: Self-pay | Admitting: Internal Medicine

## 2011-05-26 ENCOUNTER — Ambulatory Visit: Payer: 59 | Admitting: Internal Medicine

## 2011-05-26 LAB — POCT I-STAT CREATININE
Creatinine, Ser: 0.9
Operator id: 215651

## 2011-05-26 NOTE — Telephone Encounter (Signed)
Pt sch emp for 08/17/11, but pt called [employer] and was told by rep that pt has to have m[employer] cpx before end of the year, in order for [employer] to cover pts cpx.

## 2011-05-26 NOTE — Telephone Encounter (Signed)
Pt had medicare cpx in 11/11--do you know what she is talking about medicare paying for cpx--I didn't think medicare paid for cpx--please advise

## 2011-05-28 NOTE — Telephone Encounter (Signed)
Medicare patients are eligible to receive an annual wellness exam every 12 months.  Hannah Reynolds had her last Medicare Wellness Exam on 06/22/2010 and is therefore now due for her 2012 wellness exam.

## 2011-05-29 NOTE — Telephone Encounter (Signed)
Called pt and sch for fasting cpx using two 15 min slots as noted, for 07/20/11 at 10am.

## 2011-05-29 NOTE — Telephone Encounter (Signed)
yuo can put her in 2 15 minute slots before jan 1

## 2011-06-20 ENCOUNTER — Other Ambulatory Visit: Payer: Self-pay | Admitting: Internal Medicine

## 2011-06-23 ENCOUNTER — Other Ambulatory Visit: Payer: Self-pay | Admitting: *Deleted

## 2011-06-23 MED ORDER — ALPRAZOLAM 0.5 MG PO TABS: 0.5000 mg | ORAL_TABLET | Freq: Two times a day (BID) | ORAL | Status: DC | PRN

## 2011-07-20 ENCOUNTER — Ambulatory Visit (INDEPENDENT_AMBULATORY_CARE_PROVIDER_SITE_OTHER): Payer: Medicare Other | Admitting: Internal Medicine

## 2011-07-20 ENCOUNTER — Encounter: Payer: Self-pay | Admitting: Internal Medicine

## 2011-07-20 DIAGNOSIS — E785 Hyperlipidemia, unspecified: Secondary | ICD-10-CM

## 2011-07-20 DIAGNOSIS — Z0000 Encounter for general adult medical examination without abnormal findings: Secondary | ICD-10-CM

## 2011-07-20 DIAGNOSIS — T887XXA Unspecified adverse effect of drug or medicament, initial encounter: Secondary | ICD-10-CM

## 2011-07-20 DIAGNOSIS — J069 Acute upper respiratory infection, unspecified: Secondary | ICD-10-CM

## 2011-07-20 DIAGNOSIS — I10 Essential (primary) hypertension: Secondary | ICD-10-CM

## 2011-07-20 LAB — CBC WITH DIFFERENTIAL/PLATELET
Basophils Absolute: 0 10*3/uL (ref 0.0–0.1)
Basophils Relative: 0.5 % (ref 0.0–3.0)
Eosinophils Absolute: 0.5 10*3/uL (ref 0.0–0.7)
Hemoglobin: 13.3 g/dL (ref 12.0–15.0)
Lymphocytes Relative: 19 % (ref 12.0–46.0)
MCHC: 34 g/dL (ref 30.0–36.0)
Monocytes Relative: 7.6 % (ref 3.0–12.0)
Neutro Abs: 5.6 10*3/uL (ref 1.4–7.7)
Neutrophils Relative %: 66.4 % (ref 43.0–77.0)
RBC: 4.08 Mil/uL (ref 3.87–5.11)

## 2011-07-20 LAB — HEPATIC FUNCTION PANEL
AST: 19 U/L (ref 0–37)
Albumin: 4.1 g/dL (ref 3.5–5.2)
Alkaline Phosphatase: 74 U/L (ref 39–117)
Bilirubin, Direct: 0 mg/dL (ref 0.0–0.3)
Total Bilirubin: 0.2 mg/dL — ABNORMAL LOW (ref 0.3–1.2)

## 2011-07-20 LAB — LDL CHOLESTEROL, DIRECT: Direct LDL: 59.9 mg/dL

## 2011-07-20 LAB — BASIC METABOLIC PANEL
Calcium: 9 mg/dL (ref 8.4–10.5)
Chloride: 104 mEq/L (ref 96–112)
Creatinine, Ser: 0.8 mg/dL (ref 0.4–1.2)

## 2011-07-20 LAB — LIPID PANEL
HDL: 43.3 mg/dL (ref >39.00)
Total CHOL/HDL Ratio: 3
VLDL: 41.8 mg/dL — ABNORMAL HIGH (ref 0.0–40.0)

## 2011-07-20 NOTE — Patient Instructions (Signed)
The patient is instructed to continue all medications as prescribed. Schedule followup with check out clerk upon leaving the clinic  

## 2011-07-20 NOTE — Progress Notes (Signed)
Subjective:    Patient ID: Hannah Reynolds, female    DOB: [birth year], 65 y.o.   MRN: [id number]  HPI Patient is recovering from an upper respiratory tract infection that appears to have been an influenza-like illness patient had tightness in the chest and wheezing and required the use of an inhaler.  The patient is also followed for chronic medical indications for hyperlipidemia treatment and monitoring for hypertension treatment  She is also here to presents for routine Medicare yearly screening.  This is a subsequent Medicare wellness visit   Review of Systems  Constitutional: Positive for fever, chills and activity change.  HENT: Negative for congestion and rhinorrhea.   Eyes: Negative.   Respiratory: Positive for cough, shortness of breath and wheezing.   Cardiovascular: Negative.   Genitourinary: Negative.   Musculoskeletal: Negative.   Neurological: Positive for headaches.  Psychiatric/Behavioral: Negative.    Past Medical History  Diagnosis Date  . GERD (gastroesophageal reflux disease)   . Hyperlipidemia   . Hypertension   . Unspecified glaucoma   . Anxiety   . CAD (coronary artery disease)   . Nephrolithiasis     History   Social History  . Marital Status: Married    Spouse Name: N/A    Number of Children: N/A  . Years of Education: N/A   Occupational History  . retired    Social History Main Topics  . Smoking status: Never Smoker   . Smokeless tobacco: Not on file  . Alcohol Use: No  . Drug Use: No  . Sexually Active: Yes   Other Topics Concern  . Not on file   Social History Narrative  . No narrative on file    Past Surgical History  Procedure Date  . Hernia repair   . Hemicolection in 1999 during exploratory surgery    . Abdominal exploration surgery   . Ventral hernia repair   . Incisional hernia repair     Family History  Problem Relation Age of Onset  . Heart disease Mother   . Heart disease Father   . Heart disease Brother      No Known Allergies  Current Outpatient Prescriptions on File Prior to Visit  Medication Sig Dispense Refill  . ALPRAZolam (XANAX) 0.5 MG tablet Take 1 tablet (0.5 mg total) by mouth 2 (two) times daily as needed for sleep.  60 tablet  5  . aspirin 81 MG tablet Take 81 mg by mouth daily.        Marland Kitchen ezetimibe-simvastatin (VYTORIN) 10-20 MG per tablet Take 1 tablet by mouth at bedtime.        . IMDUR 30 MG 24 hr tablet TAKE ONE TABLET BY MOUTH ONCE DAILY.  30 each  11  . TARKA 2-180 MG per tablet TAKE ONE TABLET BY MOUTH ONCE DAILY.  30 each  11  . timolol (BETIMOL) 0.5 % ophthalmic solution 1 drop 2 (two) times daily.        Marland Kitchen triamcinolone (KENALOG) 0.5 % cream APPLY TO AFFECTED AREA(S)AS DIRECTED.  15 g  6  . triamterene-hydrochlorothiazide (MAXZIDE-25) 37.5-25 MG per tablet TAKE ONE TABLET DAILY AS DIRECTED.  30 tablet  11    BP 150/90  Pulse 76  Temp 98.7 F (37.1 C)  Resp 16  Ht 5\' 6"  (1.676 m)  Wt 185 lb (83.915 kg)  BMI 29.86 kg/m2       Objective:   Physical Exam  Nursing note and vitals reviewed. Constitutional: She is oriented to person,  place, and time.  HENT:  Head: Normocephalic.  Eyes: Pupils are equal, round, and reactive to light.       Moderate posterior cobblestoning with extremes of erythematous discharge that appears to be clear in the posterior pharynx  Cardiovascular: Normal rate.   Pulmonary/Chest: Effort normal and breath sounds normal.  Abdominal: Soft. Bowel sounds are normal.  Musculoskeletal: She exhibits tenderness.  Neurological: She is alert and oriented to person, place, and time. She displays normal reflexes. No cranial nerve deficit. Coordination normal.  Skin: Skin is dry.  Psychiatric: She has a normal mood and affect. Her behavior is normal.          Assessment & Plan:  Residua from upper respiratory tract infection with a chronic cough probably due to postnasal drip recommend additional Allegra or Benadryl with the Mucinex to  eradicate the cough she appears to be over the upper respiratory tract infection at this time.  Monitor blood pressure appears to be slightly elevated possibly due to her acute illness we'll recommend continued monitoring her blood pressure report of her blood pressure meds elevated greater than 140/90.  Hyperlipidemia will be monitored today with a lipid and liver profile Subjective:    Hannah Reynolds is a 65 y.o. female who presents for Medicare Annual/Subsequent preventive examination.  Preventive Screening-Counseling & Management  Tobacco History  Smoking status  . Never Smoker   Smokeless tobacco  . Not on file     Problems Prior to Visit 1.   Current Problems (verified) Patient Active Problem List  Diagnoses  . HYPERLIPIDEMIA  . ANXIETY  . GLAUCOMA NOS  . HYPERTENSION  . GERD  . FATIGUE  . SKIN RASH  . DEPENDENT EDEMA, LEGS  . SYMPTOM, SWELLING, ABDOMINAL, GENERALIZED  . NEPHROLITHIASIS, HX OF    Medications Prior to Visit Current Outpatient Prescriptions on File Prior to Visit  Medication Sig Dispense Refill  . ALPRAZolam (XANAX) 0.5 MG tablet Take 1 tablet (0.5 mg total) by mouth 2 (two) times daily as needed for sleep.  60 tablet  5  . aspirin 81 MG tablet Take 81 mg by mouth daily.        Marland Kitchen ezetimibe-simvastatin (VYTORIN) 10-20 MG per tablet Take 1 tablet by mouth at bedtime.        . IMDUR 30 MG 24 hr tablet TAKE ONE TABLET BY MOUTH ONCE DAILY.  30 each  11  . TARKA 2-180 MG per tablet TAKE ONE TABLET BY MOUTH ONCE DAILY.  30 each  11  . timolol (BETIMOL) 0.5 % ophthalmic solution 1 drop 2 (two) times daily.        Marland Kitchen triamcinolone (KENALOG) 0.5 % cream APPLY TO AFFECTED AREA(S)AS DIRECTED.  15 g  6  . triamterene-hydrochlorothiazide (MAXZIDE-25) 37.5-25 MG per tablet TAKE ONE TABLET DAILY AS DIRECTED.  30 tablet  11    Current Medications (verified) Current Outpatient Prescriptions  Medication Sig Dispense Refill  . ALPRAZolam (XANAX) 0.5 MG tablet Take  1 tablet (0.5 mg total) by mouth 2 (two) times daily as needed for sleep.  60 tablet  5  . aspirin 81 MG tablet Take 81 mg by mouth daily.        Marland Kitchen ezetimibe-simvastatin (VYTORIN) 10-20 MG per tablet Take 1 tablet by mouth at bedtime.        . IMDUR 30 MG 24 hr tablet TAKE ONE TABLET BY MOUTH ONCE DAILY.  30 each  11  . TARKA 2-180 MG per tablet TAKE ONE TABLET  BY MOUTH ONCE DAILY.  30 each  11  . timolol (BETIMOL) 0.5 % ophthalmic solution 1 drop 2 (two) times daily.        Marland Kitchen triamcinolone (KENALOG) 0.5 % cream APPLY TO AFFECTED AREA(S)AS DIRECTED.  15 g  6  . triamterene-hydrochlorothiazide (MAXZIDE-25) 37.5-25 MG per tablet TAKE ONE TABLET DAILY AS DIRECTED.  30 tablet  11     Allergies (verified) Review of patient's allergies indicates no known allergies.   PAST HISTORY  Family History Family History  Problem Relation Age of Onset  . Heart disease Mother   . Heart disease Father   . Heart disease Brother     Social History History  Substance Use Topics  . Smoking status: Never Smoker   . Smokeless tobacco: Not on file  . Alcohol Use: No     Are there smokers in your home (other than you)? No  Risk Factors Current exercise habits: The patient does not participate in regular exercise at present.  Dietary issues discussed: weight gain   Cardiac risk factors: advanced age (older than 74 for men, 2 for women), dyslipidemia, hypertension and sedentary lifestyle.  Depression Screen (Note: if answer to either of the following is "Yes", a more complete depression screening is indicated)   Over the past two weeks, have you felt down, depressed or hopeless? No  Over the past two weeks, have you felt little interest or pleasure in doing things? No  Have you lost interest or pleasure in daily life? No  Do you often feel hopeless? No  Do you cry easily over simple problems? No  Activities of Daily Living In your present state of health, do you have any difficulty performing the  following activities?:  Driving? No Managing money?  No Feeding yourself? No Getting from bed to chair? No Climbing a flight of stairs? No Preparing food and eating?: No Bathing or showering? No Getting dressed: No Getting to the toilet? No Using the toilet:No Moving around from place to place: No In the past year have you fallen or had a near fall?:No   Are you sexually active?  Yes  Do you have more than one partner?  No  Hearing Difficulties: No Do you often ask people to speak up or repeat themselves? No Do you experience ringing or noises in your ears? No Do you have difficulty understanding soft or whispered voices? No   Do you feel that you have a problem with memory? No  Do you often misplace items? No  Do you feel safe at home?  Yes  Cognitive Testing  Alert? Yes  Normal Appearance?Yes  Oriented to person? Yes  Place? Yes   Time? Yes  Recall of three objects?  Yes  Can perform simple calculations? Yes  Displays appropriate judgment?Yes  Can read the correct time from a watch face?Yes   Advanced Directives have been discussed with the patient? Yes  List the Names of Other Physician/Practitioners you currently use: 1.    Indicate any recent Medical Services you may have received from other than [facility] providers in the past year (date may be approximate).  Immunization History  Administered Date(s) Administered  . Influenza Whole 06/28/2007, 06/15/2009, 06/22/2010  . Pneumococcal Polysaccharide 06/22/2010  . Td 06/22/2010    Screening Tests Health Maintenance  Topic Date Due  . Pap Smear  02/19/1966  . Mammogram  02/19/1998  . Colonoscopy  02/19/1998  . Zostavax  02/20/2008  . Influenza Vaccine  05/15/2011  . Tetanus/tdap  06/22/2020    All answers were reviewed with the patient and necessary referrals were made:  Carrie Mew   07/20/2011   History reviewed: allergies, current medications, past family history, past medical history, past social history,  past surgical history and problem list  Review of Systems A comprehensive review of systems was negative.    Objective:     Vision by Snellen chart: right eye:20/20, left eye:20/20  Body mass index is 29.86 kg/(m^2). BP 150/90  Pulse 76  Temp 98.7 F (37.1 C)  Resp 16  Ht 5\' 6"  (1.676 m)  Wt 185 lb (83.915 kg)  BMI 29.86 kg/m2 see problem focused physical examination     Assessment:      This is a routine physical examination for this healthy  Female. Reviewed all health maintenance protocols including mammography colonoscopy bone density and reviewed appropriate screening labs. Her immunization history was reviewed as well as her current medications and allergies refills of her chronic medications were given and the plan for yearly health maintenance was discussed all orders and referrals were made as appropriate.      Plan:     During the course of the visit the patient was educated and counseled about appropriate screening and preventive services including:    Influenza vaccine  Screening mammography  Diet review for nutrition referral? Yes ____  Not Indicated ____   Patient Instructions (the written plan) was given to the patient.  Medicare Attestation I have personally reviewed: The patient's medical and social history Their use of alcohol, tobacco or illicit drugs Their current medications and supplements The patient's functional ability including ADLs,fall risks, home safety risks, cognitive, and hearing and visual impairment Diet and physical activities Evidence for depression or mood disorders  The patient's weight, height, BMI, and visual acuity have been recorded in the chart.  I have made referrals, counseling, and provided education to the patient based on review of the above and I have provided the patient with a written personalized care plan for preventive services.     Darryll Capers EDWARD   07/20/2011

## 2011-07-29 LAB — VITAMIN D 25 HYDROXY (VIT D DEFICIENCY, FRACTURES): Vit D, 25-Hydroxy: 29 ng/mL — ABNORMAL LOW (ref 30–89)

## 2011-08-01 ENCOUNTER — Other Ambulatory Visit: Payer: Self-pay | Admitting: Internal Medicine

## 2011-08-01 MED ORDER — ALBUTEROL SULFATE HFA 108 (90 BASE) MCG/ACT IN AERS: 2.0000 | INHALATION_SPRAY | Freq: Four times a day (QID) | RESPIRATORY_TRACT | Status: DC | PRN

## 2011-08-01 NOTE — Telephone Encounter (Signed)
done

## 2011-08-01 NOTE — Telephone Encounter (Signed)
Pt req refill of PROVENTIL HFA 108 (90 BASE) MCG/ACT AERS (ALBUTEROL SULFATE) 2 puffs q 4-6 hours as needed wheezing  #1 x 0 for Bronchitis. Pls call in to [facility] in [place] [phone]. Pt said that Dr Rodena Medin had prescribed this in Jan 2012.

## 2011-08-04 ENCOUNTER — Encounter: Payer: Self-pay | Admitting: Family Medicine

## 2011-08-04 ENCOUNTER — Ambulatory Visit (INDEPENDENT_AMBULATORY_CARE_PROVIDER_SITE_OTHER): Payer: Medicare Other | Admitting: Family Medicine

## 2011-08-04 DIAGNOSIS — R05 Cough: Secondary | ICD-10-CM

## 2011-08-04 DIAGNOSIS — J0100 Acute maxillary sinusitis, unspecified: Secondary | ICD-10-CM

## 2011-08-04 DIAGNOSIS — J209 Acute bronchitis, unspecified: Secondary | ICD-10-CM

## 2011-08-04 MED ORDER — GUAIFENESIN-CODEINE 100-10 MG/5ML PO SYRP: 5.0000 mL | ORAL_SOLUTION | Freq: Three times a day (TID) | ORAL | Status: AC | PRN

## 2011-08-04 MED ORDER — AMOXICILLIN-POT CLAVULANATE 875-125 MG PO TABS: 1.0000 | ORAL_TABLET | Freq: Two times a day (BID) | ORAL | Status: AC

## 2011-08-04 NOTE — Patient Instructions (Signed)

## 2011-08-04 NOTE — Progress Notes (Signed)
Subjective:    Patient ID: Hannah Reynolds, female    DOB: 10-14-1945, 65 y.o.   MRN: [id number]  HPI 65 year old white female, nonsmoker, patient of Dr. Lovell Sheehan is in today with 3 weeks of cough, congestion, wheezing that is worsening. She has a productive cough with yellow sputum. Taken over-the-counter Tylenol, Mucinex and albuterol inhaler symptoms are unchanged. Reports having a grandson who was sick with an upper respiratory infection. He has since gotten better.   Review of Systems  Constitutional: Positive for fever and fatigue.  HENT: Positive for congestion, sneezing, postnasal drip and sinus pressure.   Eyes: Negative.   Respiratory: Positive for cough.   Gastrointestinal: Negative.   Skin: Negative.   Psychiatric/Behavioral: Negative.    Past Medical History  Diagnosis Date  . GERD (gastroesophageal reflux disease)   . Hyperlipidemia   . Hypertension   . Unspecified glaucoma   . Anxiety   . CAD (coronary artery disease)   . Nephrolithiasis     History   Social History  . Marital Status: Married    Spouse Name: N/A    Number of Children: N/A  . Years of Education: N/A   Occupational History  . retired    Social History Main Topics  . Smoking status: Never Smoker   . Smokeless tobacco: Not on file  . Alcohol Use: No  . Drug Use: No  . Sexually Active: Yes   Other Topics Concern  . Not on file   Social History Narrative  . No narrative on file    Past Surgical History  Procedure Date  . Hernia repair   . Hemicolection in 1999 during exploratory surgery    . Abdominal exploration surgery   . Ventral hernia repair   . Incisional hernia repair     Family History  Problem Relation Age of Onset  . Heart disease Mother   . Heart disease Father   . Heart disease Brother     No Known Allergies  Current Outpatient Prescriptions on File Prior to Visit  Medication Sig Dispense Refill  . albuterol (PROVENTIL HFA;VENTOLIN HFA) 108 (90 BASE) MCG/ACT  inhaler Inhale 2 puffs into the lungs every 6 (six) hours as needed for wheezing.  6.7 g  1  . ALPRAZolam (XANAX) 0.5 MG tablet Take 1 tablet (0.5 mg total) by mouth 2 (two) times daily as needed for sleep.  60 tablet  5  . aspirin 81 MG tablet Take 81 mg by mouth daily.        Marland Kitchen ezetimibe-simvastatin (VYTORIN) 10-20 MG per tablet Take 1 tablet by mouth at bedtime.        . IMDUR 30 MG 24 hr tablet TAKE ONE TABLET BY MOUTH ONCE DAILY.  30 each  11  . TARKA 2-180 MG per tablet TAKE ONE TABLET BY MOUTH ONCE DAILY.  30 each  11  . timolol (BETIMOL) 0.5 % ophthalmic solution 1 drop 2 (two) times daily.        Marland Kitchen triamcinolone (KENALOG) 0.5 % cream APPLY TO AFFECTED AREA(S)AS DIRECTED.  15 g  6  . triamterene-hydrochlorothiazide (MAXZIDE-25) 37.5-25 MG per tablet TAKE ONE TABLET DAILY AS DIRECTED.  30 tablet  11    BP 150/90  Pulse 80  Temp(Src) 98.3 F (36.8 C) (Oral)  SpO2 98%chart    Objective:   Physical Exam  Constitutional: She is oriented to person, place, and time. She appears well-developed and well-nourished.  HENT:  Right Ear: External ear normal.  Left Ear:  External ear normal.  Mouth/Throat: Oropharynx is clear and moist.  Eyes: Conjunctivae are normal.  Neck: Normal range of motion. Neck supple.  Cardiovascular: Normal rate, regular rhythm and normal heart sounds.   Pulmonary/Chest: Effort normal and breath sounds normal.  Neurological: She is alert and oriented to person, place, and time.  Skin: Skin is dry.          Assessment & Plan:  Assessment: Acute bronchitis, sinusitis   Plan: Augmentin 875 one by mouth twice a day x10 days. Robitussin-AC 1 teaspoon every 8 hours when necessary. Warned of drowsiness. Patient to call if symptoms worsen or persist. Recheck in schedule and when necessary

## 2011-08-07 ENCOUNTER — Telehealth: Payer: Self-pay | Admitting: Internal Medicine

## 2011-08-07 NOTE — Telephone Encounter (Signed)
Saw Hannah Reynolds on Friday as was told to call back to let her know that she is feeling better. Wants Hannah Reynolds to have a [employer]. Thanks.

## 2011-08-09 ENCOUNTER — Encounter: Payer: 59 | Admitting: Internal Medicine

## 2011-08-17 ENCOUNTER — Encounter: Payer: 59 | Admitting: Internal Medicine

## 2011-10-17 ENCOUNTER — Other Ambulatory Visit: Payer: Self-pay | Admitting: Internal Medicine

## 2011-11-15 ENCOUNTER — Encounter: Payer: Self-pay | Admitting: Internal Medicine

## 2011-11-15 ENCOUNTER — Ambulatory Visit (INDEPENDENT_AMBULATORY_CARE_PROVIDER_SITE_OTHER): Payer: Medicare Other | Admitting: Internal Medicine

## 2011-11-15 VITALS — BP 130/88 | HR 72 | Temp 98.3°F | Resp 16 | Ht 66.0 in | Wt 190.0 lb

## 2011-11-15 DIAGNOSIS — E785 Hyperlipidemia, unspecified: Secondary | ICD-10-CM

## 2011-11-15 DIAGNOSIS — T887XXA Unspecified adverse effect of drug or medicament, initial encounter: Secondary | ICD-10-CM

## 2011-11-15 DIAGNOSIS — I10 Essential (primary) hypertension: Secondary | ICD-10-CM | POA: Diagnosis not present

## 2011-11-15 NOTE — Progress Notes (Signed)
Subjective:    Patient ID: Hannah Reynolds, female    DOB: 10-14-1945, 66 y.o.   MRN: [id number]  HPI No reported chest pains Increased stress in family Keeps monitoring blood pressure   Review of Systems  Constitutional: Negative for activity change, appetite change and fatigue.  HENT: Negative for ear pain, congestion, neck pain, postnasal drip and sinus pressure.   Eyes: Negative for redness and visual disturbance.  Respiratory: Negative for cough, shortness of breath and wheezing.   Gastrointestinal: Negative for abdominal pain and abdominal distention.  Genitourinary: Negative for dysuria, frequency and menstrual problem.  Musculoskeletal: Negative for myalgias, joint swelling and arthralgias.  Skin: Negative for rash and wound.  Neurological: Negative for dizziness, weakness and headaches.  Hematological: Negative for adenopathy. Does not bruise/bleed easily.  Psychiatric/Behavioral: Negative for sleep disturbance and decreased concentration.   Past Medical History  Diagnosis Date  . GERD (gastroesophageal reflux disease)   . Hyperlipidemia   . Hypertension   . Unspecified glaucoma   . Anxiety   . CAD (coronary artery disease)   . Nephrolithiasis     History   Social History  . Marital Status: Married    Spouse Name: N/A    Number of Children: N/A  . Years of Education: N/A   Occupational History  . retired    Social History Main Topics  . Smoking status: Never Smoker   . Smokeless tobacco: Not on file  . Alcohol Use: No  . Drug Use: No  . Sexually Active: Yes   Other Topics Concern  . Not on file   Social History Narrative  . No narrative on file    Past Surgical History  Procedure Date  . Hernia repair   . Hemicolection in 1999 during exploratory surgery    . Abdominal exploration surgery   . Ventral hernia repair   . Incisional hernia repair     Family History  Problem Relation Age of Onset  . Heart disease Mother   . Heart disease Father    . Heart disease Brother     No Known Allergies  Current Outpatient Prescriptions on File Prior to Visit  Medication Sig Dispense Refill  . albuterol (PROVENTIL HFA;VENTOLIN HFA) 108 (90 BASE) MCG/ACT inhaler Inhale 2 puffs into the lungs every 6 (six) hours as needed for wheezing.  6.7 g  1  . ALPRAZolam (XANAX) 0.5 MG tablet Take 1 tablet (0.5 mg total) by mouth 2 (two) times daily as needed for sleep.  60 tablet  5  . aspirin 81 MG tablet Take 81 mg by mouth daily.        Marland Kitchen ezetimibe-simvastatin (VYTORIN) 10-20 MG per tablet Take 1 tablet by mouth at bedtime.        . IMDUR 30 MG 24 hr tablet TAKE ONE TABLET BY MOUTH ONCE DAILY.  30 each  11  . TARKA 2-180 MG per tablet TAKE ONE TABLET BY MOUTH ONCE DAILY.  30 each  11  . timolol (BETIMOL) 0.5 % ophthalmic solution 1 drop 2 (two) times daily.        Marland Kitchen triamcinolone (KENALOG) 0.5 % cream APPLY TO AFFECTED AREA(S)AS DIRECTED.  15 g  6  . triamterene-hydrochlorothiazide (MAXZIDE-25) 37.5-25 MG per tablet TAKE ONE TABLET DAILY AS DIRECTED.  30 tablet  11    BP 130/88  Pulse 72  Temp 98.3 F (36.8 C)  Resp 16  Ht 5\' 6"  (1.676 m)  Wt 190 lb (86.183 kg)  BMI 30.67 kg/m2  Objective:   Physical Exam  Nursing note and vitals reviewed. Constitutional: She is oriented to person, place, and time. She appears well-developed and well-nourished. No distress.  HENT:  Head: Normocephalic and atraumatic.  Right Ear: External ear normal.  Left Ear: External ear normal.  Nose: Nose normal.  Mouth/Throat: Oropharynx is clear and moist.  Eyes: Conjunctivae and EOM are normal. Pupils are equal, round, and reactive to light.  Neck: Normal range of motion. Neck supple. No JVD present. No tracheal deviation present. No thyromegaly present.  Cardiovascular: Normal rate, regular rhythm, normal heart sounds and intact distal pulses.   No murmur heard. Pulmonary/Chest: Effort normal and breath sounds normal. She has no wheezes. She exhibits no  tenderness.  Abdominal: Soft. Bowel sounds are normal.  Musculoskeletal: Normal range of motion. She exhibits no edema and no tenderness.  Lymphadenopathy:    She has no cervical adenopathy.  Neurological: She is alert and oriented to person, place, and time. She has normal reflexes. No cranial nerve deficit.  Skin: Skin is warm and dry. She is not diaphoretic.  Psychiatric: She has a normal mood and affect. Her behavior is normal.          Assessment & Plan:  Blood pressure is stable GERD stable No reported chest pains Compliant with the vytorin for lipids She has gained 5 pounds , we reviewed diet and exercize

## 2011-11-15 NOTE — Patient Instructions (Signed)
The patient is instructed to continue all medications as prescribed. Schedule followup with check out clerk upon leaving the clinic  

## 2011-11-27 ENCOUNTER — Other Ambulatory Visit: Payer: Self-pay | Admitting: Internal Medicine

## 2012-01-09 ENCOUNTER — Other Ambulatory Visit: Payer: Self-pay | Admitting: Internal Medicine

## 2012-02-03 ENCOUNTER — Ambulatory Visit (INDEPENDENT_AMBULATORY_CARE_PROVIDER_SITE_OTHER): Payer: Medicare Other | Admitting: Family Medicine

## 2012-02-03 ENCOUNTER — Encounter: Payer: Self-pay | Admitting: Family Medicine

## 2012-02-03 VITALS — BP 120/68 | Temp 97.8°F | Wt 193.0 lb

## 2012-02-03 DIAGNOSIS — T148XXA Other injury of unspecified body region, initial encounter: Secondary | ICD-10-CM

## 2012-02-03 DIAGNOSIS — T148 Other injury of unspecified body region: Secondary | ICD-10-CM | POA: Diagnosis not present

## 2012-02-03 DIAGNOSIS — W57XXXA Bitten or stung by nonvenomous insect and other nonvenomous arthropods, initial encounter: Secondary | ICD-10-CM

## 2012-02-03 NOTE — Progress Notes (Signed)
  Subjective:    Patient ID: Hannah Reynolds, female    DOB: 1946-07-06, 66 y.o.   MRN: [id number]  HPI CC: tick bite  Removed last Sunday by husband, thinks removed completely.  Thinks was present for about 1 day.  Staying itchy.  Leaking a bit.  Mild HA last night.  Using drawing salve.    Denies recent fevers/chills, abd pain, n/v, joint pains, neck stiffness, rashes.  Review of Systems Per HPI    Objective:   Physical Exam WDWN, CF, NAD Left posterior thigh at area of tick bite - no residual parts noted.  Erythematous macular rash about 1" diameter around tick bite.  Slightly pruritic, no fluctuance.    Assessment & Plan:

## 2012-02-03 NOTE — Assessment & Plan Note (Signed)
Not consistent with EM or RMSF rash. No other sxs to suspect these diseases. Discussed sxs to monitor for concern for RMSF or lyme disease and to notify us immediately if any of these sxs develop. O/w reassured pt, rec abx ointment.

## 2012-02-07 ENCOUNTER — Ambulatory Visit (INDEPENDENT_AMBULATORY_CARE_PROVIDER_SITE_OTHER): Payer: Medicare Other | Admitting: Internal Medicine

## 2012-02-07 ENCOUNTER — Other Ambulatory Visit (INDEPENDENT_AMBULATORY_CARE_PROVIDER_SITE_OTHER): Payer: Medicare Other

## 2012-02-07 ENCOUNTER — Encounter: Payer: Self-pay | Admitting: Internal Medicine

## 2012-02-07 VITALS — BP 132/96 | Temp 98.0°F | Wt 192.0 lb

## 2012-02-07 DIAGNOSIS — T887XXA Unspecified adverse effect of drug or medicament, initial encounter: Secondary | ICD-10-CM

## 2012-02-07 DIAGNOSIS — T148 Other injury of unspecified body region: Secondary | ICD-10-CM | POA: Diagnosis not present

## 2012-02-07 DIAGNOSIS — W57XXXA Bitten or stung by nonvenomous insect and other nonvenomous arthropods, initial encounter: Secondary | ICD-10-CM | POA: Diagnosis not present

## 2012-02-07 DIAGNOSIS — I10 Essential (primary) hypertension: Secondary | ICD-10-CM

## 2012-02-07 DIAGNOSIS — R21 Rash and other nonspecific skin eruption: Secondary | ICD-10-CM

## 2012-02-07 DIAGNOSIS — E785 Hyperlipidemia, unspecified: Secondary | ICD-10-CM

## 2012-02-07 DIAGNOSIS — T148XXA Other injury of unspecified body region, initial encounter: Secondary | ICD-10-CM

## 2012-02-07 LAB — LIPID PANEL
Cholesterol: 121 mg/dL (ref 0–200)
HDL: 45 mg/dL (ref >39.00)
Triglycerides: 183 mg/dL — ABNORMAL HIGH (ref 0.0–149.0)
VLDL: 36.6 mg/dL (ref 0.0–40.0)

## 2012-02-07 LAB — BASIC METABOLIC PANEL
BUN: 14 mg/dL (ref 6–23)
CO2: 29 mEq/L (ref 19–32)
Calcium: 9 mg/dL (ref 8.4–10.5)
GFR: 86.69 mL/min (ref >60.00)
Glucose, Bld: 82 mg/dL (ref 70–99)
Sodium: 140 mEq/L (ref 135–145)

## 2012-02-07 LAB — HEPATIC FUNCTION PANEL
Albumin: 4.1 g/dL (ref 3.5–5.2)
Total Protein: 7.3 g/dL (ref 6.0–8.3)

## 2012-02-07 MED ORDER — CEPHALEXIN 500 MG PO CAPS: 500.0000 mg | ORAL_CAPSULE | Freq: Four times a day (QID) | ORAL | Status: AC

## 2012-02-07 NOTE — Patient Instructions (Signed)
Take your antibiotic as prescribed until ALL of it is gone, but stop if you develop a rash, swelling, or any side effects of the medication.  Contact our office as soon as possible if  there are side effects of the medication.  Call or return to clinic prn if these symptoms worsen or fail to improve as anticipated.  

## 2012-02-07 NOTE — Progress Notes (Signed)
  Subjective:    Patient ID: Hannah Reynolds, female    DOB: 06/08/1946, 66 y.o.   MRN: [id number]  HPI  66 year old patient who is seen today complaining of a rash involving her left posterior inner thigh area. She was bit by a tick there earlier and was evaluated at the [facility] 4 days ago. She has been applying antibiotic ointment and wrapping. She has developed a worsening erythema.    Review of Systems  Skin: Positive for rash.       Objective:   Physical Exam  Skin:       There is a small 2 mm crusted lesion involving the left inner posterior thigh region. There is a surrounding area of approximately 5 cm of erythema and induration.          Assessment & Plan:  Tick bite/dermatitis. Appears to be more of a contact dermatitis related to either tape or possibly antibiotic ointment. Cannot rule out early cellulitis. We'll treat with triamcinolone cream only and discontinue topical antibiotic ointment. We'll leave open to air;  we'll treat with cephalexin for 7 days

## 2012-02-19 ENCOUNTER — Encounter: Payer: Self-pay | Admitting: Internal Medicine

## 2012-02-19 ENCOUNTER — Ambulatory Visit (INDEPENDENT_AMBULATORY_CARE_PROVIDER_SITE_OTHER): Payer: Medicare Other | Admitting: Internal Medicine

## 2012-02-19 VITALS — BP 130/82 | HR 72 | Temp 98.2°F | Resp 16 | Ht 66.0 in | Wt 192.0 lb

## 2012-02-19 DIAGNOSIS — K219 Gastro-esophageal reflux disease without esophagitis: Secondary | ICD-10-CM

## 2012-02-19 DIAGNOSIS — E785 Hyperlipidemia, unspecified: Secondary | ICD-10-CM

## 2012-02-19 DIAGNOSIS — R5381 Other malaise: Secondary | ICD-10-CM

## 2012-02-19 NOTE — Patient Instructions (Signed)
The patient is instructed to continue all medications as prescribed. Schedule followup with check out clerk upon leaving the clinic  

## 2012-02-19 NOTE — Progress Notes (Signed)
  Subjective:    Patient ID: Hannah Reynolds, female    DOB: 11-16-1945, 66 y.o.   MRN: [id number]  HPI Patient is a 66 year old female who is followed for hypertension hyperlipidemia gastroesophageal reflux and inability to lose weight.  Her blood pressure today is stable at 130/80 and she had blood work done in advance of her office visit which showed normal liver functions cholesterol stable basic metabolic panel stable and triglycerides slightly improved but still elevated at 183.   Review of Systems  Constitutional: Negative for activity change, appetite change and fatigue.  HENT: Negative for ear pain, congestion, neck pain, postnasal drip and sinus pressure.   Eyes: Negative for redness and visual disturbance.  Respiratory: Negative for cough, shortness of breath and wheezing.   Gastrointestinal: Negative for abdominal pain and abdominal distention.  Genitourinary: Negative for dysuria, frequency and menstrual problem.  Musculoskeletal: Negative for myalgias, joint swelling and arthralgias.  Skin: Negative for rash and wound.  Neurological: Negative for dizziness, weakness and headaches.  Hematological: Negative for adenopathy. Does not bruise/bleed easily.  Psychiatric/Behavioral: Negative for disturbed wake/sleep cycle and decreased concentration.       Objective:   Physical Exam  Nursing note and vitals reviewed. Constitutional: She is oriented to person, place, and time. She appears well-developed and well-nourished. No distress.  HENT:  Head: Normocephalic and atraumatic.  Right Ear: External ear normal.  Left Ear: External ear normal.  Nose: Nose normal.  Mouth/Throat: Oropharynx is clear and moist.  Eyes: Conjunctivae and EOM are normal. Pupils are equal, round, and reactive to light.  Neck: Normal range of motion. Neck supple. No JVD present. No tracheal deviation present. No thyromegaly present.  Cardiovascular: Normal rate, regular rhythm, normal heart sounds and  intact distal pulses.   No murmur heard. Pulmonary/Chest: Effort normal and breath sounds normal. She has no wheezes. She exhibits no tenderness.  Abdominal: Soft. Bowel sounds are normal.  Musculoskeletal: Normal range of motion. She exhibits no edema and no tenderness.  Lymphadenopathy:    She has no cervical adenopathy.  Neurological: She is alert and oriented to person, place, and time. She has normal reflexes. No cranial nerve deficit.  Skin: Skin is warm and dry. She is not diaphoretic.  Psychiatric: She has a normal mood and affect. Her behavior is normal.          Assessment & Plan:  Review gluten free diet principles Hypertension is stable her blood sugars are stable cholesterol stable with the exception of triglycerides emphasized weight loss is the primary intervention for control of triglycerides

## 2012-03-11 ENCOUNTER — Telehealth: Payer: Self-pay | Admitting: Internal Medicine

## 2012-03-11 NOTE — Telephone Encounter (Signed)
Caller: Kemonie/Patient; Phone Number: [phone]; Message from caller: They still have [employer] but now on Medicare and her Rx for Takar2-180 is requiring a pre authorization Pharmacy is getting her a couple pills to hold her until this is done but needs ASAP please

## 2012-03-12 NOTE — Telephone Encounter (Signed)
noted 

## 2012-03-12 NOTE — Telephone Encounter (Signed)
I received the fax request from her pharmacy. Am working on it. Thanks!

## 2012-03-25 ENCOUNTER — Other Ambulatory Visit: Payer: Self-pay | Admitting: Internal Medicine

## 2012-04-17 DIAGNOSIS — H409 Unspecified glaucoma: Secondary | ICD-10-CM | POA: Diagnosis not present

## 2012-04-17 DIAGNOSIS — H43399 Other vitreous opacities, unspecified eye: Secondary | ICD-10-CM | POA: Diagnosis not present

## 2012-04-17 DIAGNOSIS — H4011X0 Primary open-angle glaucoma, stage unspecified: Secondary | ICD-10-CM | POA: Diagnosis not present

## 2012-04-17 DIAGNOSIS — H5210 Myopia, unspecified eye: Secondary | ICD-10-CM | POA: Diagnosis not present

## 2012-05-19 ENCOUNTER — Encounter (HOSPITAL_COMMUNITY): Payer: Self-pay

## 2012-05-19 ENCOUNTER — Emergency Department (HOSPITAL_COMMUNITY)
Admission: EM | Admit: 2012-05-19 | Discharge: 2012-05-19 | Disposition: A | Discharge status: Home / Self Care | Payer: Medicare Other | Attending: Emergency Medicine | Admitting: Emergency Medicine

## 2012-05-19 ENCOUNTER — Emergency Department (HOSPITAL_COMMUNITY): Payer: Medicare Other

## 2012-05-19 ENCOUNTER — Other Ambulatory Visit: Payer: Self-pay

## 2012-05-19 DIAGNOSIS — I2510 Atherosclerotic heart disease of native coronary artery without angina pectoris: Secondary | ICD-10-CM | POA: Insufficient documentation

## 2012-05-19 DIAGNOSIS — R109 Unspecified abdominal pain: Secondary | ICD-10-CM | POA: Diagnosis not present

## 2012-05-19 DIAGNOSIS — S0990XA Unspecified injury of head, initial encounter: Secondary | ICD-10-CM | POA: Diagnosis not present

## 2012-05-19 DIAGNOSIS — S139XXA Sprain of joints and ligaments of unspecified parts of neck, initial encounter: Secondary | ICD-10-CM | POA: Insufficient documentation

## 2012-05-19 DIAGNOSIS — R51 Headache: Secondary | ICD-10-CM | POA: Insufficient documentation

## 2012-05-19 DIAGNOSIS — S199XXA Unspecified injury of neck, initial encounter: Secondary | ICD-10-CM | POA: Diagnosis not present

## 2012-05-19 DIAGNOSIS — T1490XA Injury, unspecified, initial encounter: Secondary | ICD-10-CM | POA: Diagnosis not present

## 2012-05-19 DIAGNOSIS — S3981XA Other specified injuries of abdomen, initial encounter: Secondary | ICD-10-CM | POA: Diagnosis not present

## 2012-05-19 DIAGNOSIS — I10 Essential (primary) hypertension: Secondary | ICD-10-CM | POA: Diagnosis not present

## 2012-05-19 DIAGNOSIS — M542 Cervicalgia: Secondary | ICD-10-CM | POA: Insufficient documentation

## 2012-05-19 DIAGNOSIS — S161XXA Strain of muscle, fascia and tendon at neck level, initial encounter: Secondary | ICD-10-CM

## 2012-05-19 DIAGNOSIS — Z7982 Long term (current) use of aspirin: Secondary | ICD-10-CM | POA: Insufficient documentation

## 2012-05-19 DIAGNOSIS — R1084 Generalized abdominal pain: Secondary | ICD-10-CM | POA: Diagnosis not present

## 2012-05-19 LAB — POCT I-STAT, CHEM 8
Creatinine, Ser: 1 mg/dL (ref 0.50–1.10)
HCT: 36 % (ref 36.0–46.0)
Hemoglobin: 12.2 g/dL (ref 12.0–15.0)
Potassium: 3.4 mEq/L — ABNORMAL LOW (ref 3.5–5.1)
Sodium: 142 mEq/L (ref 135–145)
TCO2: 25 mmol/L (ref 0–100)

## 2012-05-19 MED ORDER — IOHEXOL 300 MG/ML  SOLN
100.0000 mL | Freq: Once | INTRAMUSCULAR | Status: AC | PRN
  Administered 2012-05-19: 100 mL via INTRAVENOUS

## 2012-05-19 MED ORDER — IBUPROFEN 800 MG PO TABS: 800.0000 mg | ORAL_TABLET | Freq: Three times a day (TID) | ORAL | Status: DC

## 2012-05-19 MED ORDER — IBUPROFEN 800 MG PO TABS
800.0000 mg | ORAL_TABLET | Freq: Once | ORAL | Status: AC
  Administered 2012-05-19: 800 mg via ORAL
  Filled 2012-05-19: qty 1
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
## 2012-05-19 NOTE — ED Notes (Signed)
c-collar placed in triage
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
## 2012-05-19 NOTE — ED Notes (Signed)
Patient with no complaints at this time. Respirations even and unlabored. Skin warm/dry. Discharge instructions reviewed with patient at this time. Patient given opportunity to voice concerns/ask questions. IV removed per policy and band-aid applied to site. Patient discharged at this time and left Emergency Department with steady gait.  

## 2012-05-19 NOTE — ED Notes (Signed)
Patient ambulated in hallway with steady gait. Did c/o some dizziness. Returned to room.

## 2012-05-20 DIAGNOSIS — H40019 Open angle with borderline findings, low risk, unspecified eye: Secondary | ICD-10-CM | POA: Diagnosis not present

## 2012-06-03 ENCOUNTER — Ambulatory Visit (INDEPENDENT_AMBULATORY_CARE_PROVIDER_SITE_OTHER): Payer: Medicare Other | Admitting: Internal Medicine

## 2012-06-03 ENCOUNTER — Encounter: Payer: Self-pay | Admitting: Internal Medicine

## 2012-06-03 VITALS — BP 140/80 | HR 72 | Temp 98.6°F | Resp 16 | Ht 66.0 in | Wt 188.0 lb

## 2012-06-03 DIAGNOSIS — I10 Essential (primary) hypertension: Secondary | ICD-10-CM

## 2012-06-03 DIAGNOSIS — S139XXA Sprain of joints and ligaments of unspecified parts of neck, initial encounter: Secondary | ICD-10-CM

## 2012-06-03 DIAGNOSIS — S161XXA Strain of muscle, fascia and tendon at neck level, initial encounter: Secondary | ICD-10-CM

## 2012-06-03 LAB — BASIC METABOLIC PANEL
BUN: 16 mg/dL (ref 6–23)
Chloride: 103 mEq/L (ref 96–112)
GFR: 73.49 mL/min (ref >60.00)
Potassium: 4.7 mEq/L (ref 3.5–5.1)
Sodium: 140 mEq/L (ref 135–145)

## 2012-06-03 MED ORDER — IBUPROFEN 800 MG PO TABS: 800.0000 mg | ORAL_TABLET | Freq: Three times a day (TID) | ORAL | Status: DC

## 2012-06-03 NOTE — Patient Instructions (Addendum)
The patient is instructed to continue all medications as prescribed. Schedule followup with check out clerk upon leaving the clinic  
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
## 2012-07-09 ENCOUNTER — Other Ambulatory Visit: Payer: Self-pay | Admitting: Internal Medicine

## 2012-08-19 ENCOUNTER — Other Ambulatory Visit: Payer: Self-pay | Admitting: *Deleted

## 2012-08-19 DIAGNOSIS — S161XXA Strain of muscle, fascia and tendon at neck level, initial encounter: Secondary | ICD-10-CM

## 2012-08-19 MED ORDER — IBUPROFEN 800 MG PO TABS: 800.0000 mg | ORAL_TABLET | Freq: Three times a day (TID) | ORAL | Status: DC

## 2012-09-05 ENCOUNTER — Other Ambulatory Visit: Payer: Self-pay | Admitting: *Deleted

## 2012-09-05 MED ORDER — EZETIMIBE-SIMVASTATIN 10-20 MG PO TABS: 1.0000 | ORAL_TABLET | Freq: Every day | ORAL | Status: DC

## 2012-09-09 ENCOUNTER — Other Ambulatory Visit: Payer: Self-pay | Admitting: Internal Medicine

## 2012-09-10 ENCOUNTER — Other Ambulatory Visit: Payer: Self-pay | Admitting: *Deleted

## 2012-09-18 ENCOUNTER — Other Ambulatory Visit: Payer: Self-pay | Admitting: Internal Medicine

## 2012-09-25 ENCOUNTER — Other Ambulatory Visit: Payer: Self-pay | Admitting: Internal Medicine

## 2012-10-03 ENCOUNTER — Other Ambulatory Visit: Payer: Self-pay | Admitting: *Deleted

## 2012-10-03 MED ORDER — ALPRAZOLAM 0.5 MG PO TABS: 0.5000 mg | ORAL_TABLET | Freq: Two times a day (BID) | ORAL | Status: DC | PRN

## 2012-10-07 ENCOUNTER — Ambulatory Visit: Payer: Medicare Other | Admitting: Internal Medicine

## 2012-10-15 ENCOUNTER — Ambulatory Visit: Payer: Medicare Other | Admitting: Internal Medicine

## 2012-11-25 ENCOUNTER — Telehealth: Payer: Self-pay | Admitting: Internal Medicine

## 2012-11-25 NOTE — Telephone Encounter (Signed)
Left message on machine none available.will call to p harmacy,but let us know

## 2012-11-25 NOTE — Telephone Encounter (Signed)
Pt needs samples of vytorin 10-20 mg
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
## 2012-12-09 ENCOUNTER — Other Ambulatory Visit: Payer: Self-pay | Admitting: *Deleted

## 2012-12-09 MED ORDER — EZETIMIBE 10 MG PO TABS: 10.0000 mg | ORAL_TABLET | Freq: Every day | ORAL | Status: DC

## 2012-12-09 MED ORDER — SIMVASTATIN 20 MG PO TABS: ORAL_TABLET | ORAL | Status: DC

## 2012-12-11 ENCOUNTER — Telehealth: Payer: Self-pay | Admitting: Internal Medicine

## 2012-12-11 NOTE — Telephone Encounter (Signed)
Ov with dr Renette Butters tomorrow- couldn't come today

## 2012-12-11 NOTE — Telephone Encounter (Signed)
Patient Information:  Caller Name: [place]  Phone: [phone]  Patient: Hannah Reynolds  Gender: Female  DOB: 1946/08/11  Age: 67 Years  PCP: Darryll Capers (Adults only)  Office Follow Up:  Does the office need to follow up with this patient?: Yes  Instructions For The Office: OFFICE PLEASE SEE IF DR Lovell Sheehan WILL CALL IN RX OF PREDNISONE FOR PT.  PT USES [facility] IN [place].  RN Note:  Rn also advised Benadryl for the itching  Symptoms  Reason For Call & Symptoms: caller reports that every year she gets the "posion".  Location on arms, chest and legs.  Caller is wanting a RX of Prednisone  (because it is inside her skin)  Reviewed Health History In EMR: Yes  Reviewed Medications In EMR: Yes  Reviewed Allergies In EMR: Yes  Reviewed Surgeries / Procedures: Yes  Date of Onset of Symptoms: 12/07/2012  Treatments Tried: OTC creams  Treatments Tried Worked: No  Guideline(s) Used:  [facility] or [facility]  Disposition Per Guideline:   Home Care  Reason For Disposition Reached:   [place] [place], [place], or [facility] with no complications  Advice Given:  Hydrocortisone Cream for Itching:   Apply 1% hydrocortisone cream 4 times a day to reduce itching. Use it for 5 days.  Apply Cold to the Area:  Soak the involved area in cool water for 20 minutes or massage it with an ice cube as often as necessary to reduce itching and oozing.  Call Back If:  Rash lasts longer than 3 weeks  It looks infected  You become worse.  Patient Refused Recommendation:  Patient Requests Prescription  Pt is requesting RX of prednisone for the poison ivy/oak

## 2012-12-12 ENCOUNTER — Encounter: Payer: Self-pay | Admitting: Internal Medicine

## 2012-12-12 ENCOUNTER — Ambulatory Visit (INDEPENDENT_AMBULATORY_CARE_PROVIDER_SITE_OTHER): Payer: Medicare Other | Admitting: Internal Medicine

## 2012-12-12 VITALS — BP 126/88 | HR 75 | Temp 97.8°F | Resp 20 | Wt 189.0 lb

## 2012-12-12 DIAGNOSIS — I10 Essential (primary) hypertension: Secondary | ICD-10-CM | POA: Diagnosis not present

## 2012-12-12 DIAGNOSIS — L259 Unspecified contact dermatitis, unspecified cause: Secondary | ICD-10-CM

## 2012-12-12 MED ORDER — METHYLPREDNISOLONE ACETATE 80 MG/ML IJ SUSP
80.0000 mg | Freq: Once | INTRAMUSCULAR | Status: AC
  Administered 2012-12-12: 80 mg via INTRAMUSCULAR

## 2012-12-12 NOTE — Patient Instructions (Signed)
Limit your sodium (Salt) intake  Please check your blood pressure on a regular basis.  If it is consistently greater than 150/90, please make an office appointment.  Annual exam as scheduled 

## 2012-12-12 NOTE — Progress Notes (Signed)
Subjective:    Patient ID: Hannah Reynolds, female    DOB: [birth year], 67 y.o.   MRN: [id number]  HPI 67 year old patient who has treated hypertension. She presents with a one-week history of a rash involving primarily her extremities after 3 days of yard work. She has had content dermatitis in the past. No fever or systemic complaints. She is scheduled for a health maintenance examination in June  Past Medical History  Diagnosis Date  . GERD (gastroesophageal reflux disease)   . Hyperlipidemia   . Hypertension   . Unspecified glaucoma(365.9)   . Anxiety   . CAD (coronary artery disease)   . Nephrolithiasis     History   Social History  . Marital Status: Married    Spouse Name: N/A    Number of Children: N/A  . Years of Education: N/A   Occupational History  . retired    Social History Main Topics  . Smoking status: Never Smoker   . Smokeless tobacco: Not on file  . Alcohol Use: No  . Drug Use: No  . Sexually Active: Yes    Birth Control/ Protection: None   Other Topics Concern  . Not on file   Social History Narrative  . No narrative on file    Past Surgical History  Procedure Laterality Date  . Hernia repair    . Hemicolection in 1999 during exploratory surgery     . Abdominal exploration surgery    . Ventral hernia repair    . Incisional hernia repair      Family History  Problem Relation Age of Onset  . Heart disease Mother   . Heart disease Father   . Heart disease Brother     No Known Allergies  Current Outpatient Prescriptions on File Prior to Visit  Medication Sig Dispense Refill  . ALPRAZolam (XANAX) 0.5 MG tablet Take 1 tablet (0.5 mg total) by mouth 2 (two) times daily as needed. anxiety  30 tablet  3  . aspirin EC 81 MG tablet Take 81 mg by mouth daily.      Marland Kitchen ezetimibe (ZETIA) 10 MG tablet Take 1 tablet (10 mg total) by mouth daily.  90 tablet  3  . ezetimibe-simvastatin (VYTORIN) 10-20 MG per tablet Take 1 tablet by mouth at bedtime.  30  tablet  prn  . ibuprofen (ADVIL,MOTRIN) 800 MG tablet Take 1 tablet (800 mg total) by mouth 3 (three) times daily.  90 tablet  1  . isosorbide mononitrate (IMDUR) 30 MG 24 hr tablet TAKE ONE TABLET BY MOUTH ONCE DAILY.  30 tablet  6  . lisinopril (PRINIVIL,ZESTRIL) 20 MG tablet Take 20 mg by mouth daily.      . simvastatin (ZOCOR) 20 MG tablet 1 every day  90 tablet  3  . timolol (BETIMOL) 0.5 % ophthalmic solution Place 1 drop into both eyes 2 (two) times daily.       Marland Kitchen triamcinolone cream (KENALOG) 0.5 % Apply 1 application topically as directed.      . triamterene-hydrochlorothiazide (MAXZIDE-25) 37.5-25 MG per tablet TAKE ONE TABLET DAILY AS DIRECTED.  30 tablet  6  . VENTOLIN HFA 108 (90 BASE) MCG/ACT inhaler USE 2 PUFFS EVERY 6 HOURS AS NEEDED FOR WHEEZING.  18 g  6  . verapamil (COVERA HS) 180 MG (CO) 24 hr tablet Take 180 mg by mouth daily.       No current facility-administered medications on file prior to visit.    BP 126/88  Pulse 75  Temp(Src) 97.8 F (36.6 C) (Oral)  Resp 20  Wt 189 lb (85.73 kg)  BMI 30.52 kg/m2  SpO2 98%         Review of Systems  Skin: Positive for rash.       Objective:   Physical Exam  Constitutional: She appears well-developed and well-nourished. No distress.  Blood pressure 126/84  Skin: Rash noted.  Scattered erythematous papular eruptions over the extremities and trunk          Assessment & Plan Plan:Content dermatitis. Will treat with Depo-Medrol. Will continue antihistamines                    Content dermatitis  we'll treat with Depo-Medrol and continue antihistamines. Hypertension stable  Annual exam as scheduled

## 2012-12-16 ENCOUNTER — Emergency Department (HOSPITAL_COMMUNITY)
Admission: EM | Admit: 2012-12-16 | Discharge: 2012-12-16 | Disposition: A | Discharge status: Home / Self Care | Payer: Medicare Other | Attending: Emergency Medicine | Admitting: Emergency Medicine

## 2012-12-16 ENCOUNTER — Encounter (HOSPITAL_COMMUNITY): Payer: Self-pay | Admitting: *Deleted

## 2012-12-16 ENCOUNTER — Emergency Department (HOSPITAL_COMMUNITY): Payer: Medicare Other

## 2012-12-16 DIAGNOSIS — Y939 Activity, unspecified: Secondary | ICD-10-CM | POA: Insufficient documentation

## 2012-12-16 DIAGNOSIS — Z79899 Other long term (current) drug therapy: Secondary | ICD-10-CM | POA: Insufficient documentation

## 2012-12-16 DIAGNOSIS — Z7982 Long term (current) use of aspirin: Secondary | ICD-10-CM | POA: Diagnosis not present

## 2012-12-16 DIAGNOSIS — Z8719 Personal history of other diseases of the digestive system: Secondary | ICD-10-CM | POA: Insufficient documentation

## 2012-12-16 DIAGNOSIS — S60229A Contusion of unspecified hand, initial encounter: Secondary | ICD-10-CM | POA: Insufficient documentation

## 2012-12-16 DIAGNOSIS — I10 Essential (primary) hypertension: Secondary | ICD-10-CM | POA: Diagnosis not present

## 2012-12-16 DIAGNOSIS — I2510 Atherosclerotic heart disease of native coronary artery without angina pectoris: Secondary | ICD-10-CM | POA: Diagnosis not present

## 2012-12-16 DIAGNOSIS — Z87442 Personal history of urinary calculi: Secondary | ICD-10-CM | POA: Diagnosis not present

## 2012-12-16 DIAGNOSIS — W2209XA Striking against other stationary object, initial encounter: Secondary | ICD-10-CM | POA: Insufficient documentation

## 2012-12-16 DIAGNOSIS — F411 Generalized anxiety disorder: Secondary | ICD-10-CM | POA: Insufficient documentation

## 2012-12-16 DIAGNOSIS — Y929 Unspecified place or not applicable: Secondary | ICD-10-CM | POA: Insufficient documentation

## 2012-12-16 DIAGNOSIS — E785 Hyperlipidemia, unspecified: Secondary | ICD-10-CM | POA: Diagnosis not present

## 2012-12-16 DIAGNOSIS — H409 Unspecified glaucoma: Secondary | ICD-10-CM | POA: Diagnosis not present

## 2012-12-16 DIAGNOSIS — S60221A Contusion of right hand, initial encounter: Secondary | ICD-10-CM

## 2012-12-16 MED ORDER — IBUPROFEN 800 MG PO TABS: 800.0000 mg | ORAL_TABLET | Freq: Three times a day (TID) | ORAL | Status: DC

## 2012-12-16 NOTE — ED Provider Notes (Signed)
History     CSN: [id number]  Arrival date & time 12/16/12  2116   First MD Initiated Contact with Patient 12/16/12 2257      Chief Complaint  Patient presents with  . Hand Injury    (Consider location/radiation/quality/duration/timing/severity/associated sxs/prior treatment) HPI Comments: Patient c/o pain and swelling to her dorsal right hand that began suddenly after she accidentally struck the back of her hand against a door.  Pain is worse with movement of her hand and improves with ice.  She denies numbness or weakness of the hand, elbow pain or pain to her fingers.   Patient is a 67 y.o. female presenting with hand injury. The history is provided by the patient.  Hand Injury Location:  Hand Time since incident:  2 hours Injury: yes   Mechanism of injury comment:  Direct blow Hand location:  Dorsum of R hand Pain details:    Quality:  Aching and throbbing   Radiates to: right wrist.   Severity:  Mild   Onset quality:  Sudden   Timing:  Constant   Progression:  Unchanged Chronicity:  New Handedness:  Right-handed Dislocation: no   Foreign body present:  No foreign bodies Prior injury to area:  No Relieved by:  Ice Ineffective treatments:  None tried Associated symptoms: swelling   Associated symptoms: no decreased range of motion, no fever, no muscle weakness, no neck pain, no numbness and no stiffness   Risk factors: no frequent fractures     Past Medical History  Diagnosis Date  . GERD (gastroesophageal reflux disease)   . Hyperlipidemia   . Hypertension   . Unspecified glaucoma(365.9)   . Anxiety   . CAD (coronary artery disease)   . Nephrolithiasis     Past Surgical History  Procedure Laterality Date  . Hernia repair    . Hemicolection in 1999 during exploratory surgery     . Abdominal exploration surgery    . Ventral hernia repair    . Incisional hernia repair      Family History  Problem Relation Age of Onset  . Heart disease Mother   . Heart  disease Father   . Heart disease Brother     History  Substance Use Topics  . Smoking status: Never Smoker   . Smokeless tobacco: Not on file  . Alcohol Use: No    OB History   Grav Para Term Preterm Abortions TAB SAB Ect Mult Living                  Review of Systems  Constitutional: Negative for fever and chills.  HENT: Negative for neck pain.   Genitourinary: Negative for dysuria and difficulty urinating.  Musculoskeletal: Positive for joint swelling and arthralgias. Negative for stiffness.  Skin: Positive for color change. Negative for wound.  Neurological: Negative for weakness and numbness.  Hematological: Does not bruise/bleed easily.  All other systems reviewed and are negative.    Allergies  Review of patient's allergies indicates no known allergies.  Home Medications   Current Outpatient Rx  Name  Route  Sig  Dispense  Refill  . ALPRAZolam (XANAX) 0.5 MG tablet   Oral   Take 1 tablet (0.5 mg total) by mouth 2 (two) times daily as needed. anxiety   30 tablet   3   . aspirin EC 81 MG tablet   Oral   Take 81 mg by mouth daily.         Marland Kitchen ezetimibe (ZETIA) 10 MG  tablet   Oral   Take 1 tablet (10 mg total) by mouth daily.   90 tablet   3   . ezetimibe-simvastatin (VYTORIN) 10-20 MG per tablet   Oral   Take 1 tablet by mouth at bedtime.   30 tablet   prn   . ibuprofen (ADVIL,MOTRIN) 800 MG tablet   Oral   Take 1 tablet (800 mg total) by mouth 3 (three) times daily.   90 tablet   1   . isosorbide mononitrate (IMDUR) 30 MG 24 hr tablet      TAKE ONE TABLET BY MOUTH ONCE DAILY.   30 tablet   6   . lisinopril (PRINIVIL,ZESTRIL) 20 MG tablet   Oral   Take 20 mg by mouth daily.         . simvastatin (ZOCOR) 20 MG tablet      1 every day   90 tablet   3     This is the alternative to vytorin-please explain  ...   . timolol (BETIMOL) 0.5 % ophthalmic solution   Both Eyes   Place 1 drop into both eyes 2 (two) times daily.            . timolol (TIMOPTIC) 0.5 % ophthalmic solution               . triamcinolone cream (KENALOG) 0.5 %   Topical   Apply 1 application topically as directed.         . triamterene-hydrochlorothiazide (MAXZIDE-25) 37.5-25 MG per tablet      TAKE ONE TABLET DAILY AS DIRECTED.   30 tablet   6   . VENTOLIN HFA 108 (90 BASE) MCG/ACT inhaler      USE 2 PUFFS EVERY 6 HOURS AS NEEDED FOR WHEEZING.   18 g   6   . verapamil (COVERA HS) 180 MG (CO) 24 hr tablet   Oral   Take 180 mg by mouth daily.           BP 138/56  Pulse 68  Temp(Src) 97.7 F (36.5 C) (Oral)  Resp 20  Ht 5\' 6"  (1.676 m)  Wt 180 lb (81.647 kg)  BMI 29.07 kg/m2  SpO2 99%  Physical Exam  Nursing note and vitals reviewed. Constitutional: She is oriented to person, place, and time. She appears well-developed and well-nourished. No distress.  HENT:  Head: Normocephalic and atraumatic.  Cardiovascular: Normal rate, regular rhythm, normal heart sounds and intact distal pulses.   No murmur heard. Pulmonary/Chest: Effort normal and breath sounds normal.  Musculoskeletal: She exhibits tenderness. She exhibits no edema.       Right hand: She exhibits tenderness and swelling. She exhibits normal range of motion, no bony tenderness, normal two-point discrimination, normal capillary refill, no deformity and no laceration. Normal sensation noted. Normal strength noted.       Hands: Localized ttp and bruising to the dorsal right hand at the proximal thumb and second metacarpal.     Radial pulse is brisk, distal sensation intact.  CR< 2 sec.  No erythema  Or bony  deformity.  Patient has full ROM of wrist and fingers  Neurological: She is alert and oriented to person, place, and time. She exhibits normal muscle tone. Coordination normal.  Skin: Skin is warm and dry.    ED Course  Procedures (including critical care time)  Labs Reviewed - No data to display Dg Hand Complete Right  12/16/2012  *RADIOLOGY REPORT*   Clinical Data: 67 year old female with  right hand pain and bruising status post blunt trauma.  RIGHT HAND - COMPLETE 3+ VIEW  Comparison: None.  Findings: Bone mineralization is within normal limits for age. Distal radius and ulna intact.  Carpal bone alignment and joint spaces within normal limits.  Metacarpals intact.  Phalanges intact.  Thumb MCP and IP joint space loss and degenerative spurring.  IMPRESSION: No acute fracture or dislocation identified about the right hand.   Original Report Authenticated By: Erskine Speed, M.D.      velcro wrist splint applied, pain improved.  Remains NV intact   MDM    Localized quarter sized hematoma to the dorsal right hand.  Full ROM of the wrist and fingers.  NV intact.  X-ray finding reviewed and discussed with patient.    She agrees to elevate, ice and f/u with Dr. Hilda Lias for recheck if needed.     The patient appears reasonably screened and/or stabilized for discharge and I doubt any other medical condition or other [facility] requiring further screening, evaluation, or treatment in the ED at this time prior to discharge.    Cynai Skeens L. Trisha Mangle, PA-C 12/16/12 2344

## 2012-12-16 NOTE — ED Notes (Signed)
Hit rt hand against wall when getting undressed,

## 2012-12-16 NOTE — ED Notes (Signed)
Ice pack on hand since initial injury.  Hematoma to dorsum of hand.

## 2012-12-17 NOTE — ED Provider Notes (Signed)
Medical screening examination/treatment/procedure(s) were performed by non-physician practitioner and as supervising physician I was immediately available for consultation/collaboration.  Flint Melter, MD 12/17/12 [phone]

## 2013-01-08 ENCOUNTER — Other Ambulatory Visit: Payer: Self-pay | Admitting: Internal Medicine

## 2013-01-27 ENCOUNTER — Ambulatory Visit: Payer: Medicare Other | Admitting: Internal Medicine

## 2013-01-28 ENCOUNTER — Other Ambulatory Visit: Payer: Self-pay | Admitting: Internal Medicine

## 2013-01-31 ENCOUNTER — Telehealth: Payer: Self-pay | Admitting: Internal Medicine

## 2013-01-31 NOTE — Telephone Encounter (Signed)
PT called to request a sooner appt with Dr. Lovell Sheehan, than what is available. She states that she only needs a 3 month fu, but that she has not been seen since 05/26/13. Please assist me in finding a sooner date.

## 2013-01-31 NOTE — Telephone Encounter (Signed)
We dont have anything and dr Lovell Sheehan said she can certainly see padonda

## 2013-01-31 NOTE — Telephone Encounter (Signed)
Scheduled for the next available 05/14/13.

## 2013-05-07 ENCOUNTER — Ambulatory Visit (INDEPENDENT_AMBULATORY_CARE_PROVIDER_SITE_OTHER): Payer: Medicare Other | Admitting: Family Medicine

## 2013-05-07 ENCOUNTER — Encounter: Payer: Self-pay | Admitting: Family Medicine

## 2013-05-07 VITALS — BP 120/74 | HR 70 | Temp 98.3°F | Wt 189.0 lb

## 2013-05-07 DIAGNOSIS — J069 Acute upper respiratory infection, unspecified: Secondary | ICD-10-CM

## 2013-05-07 MED ORDER — HYDROCODONE-HOMATROPINE 5-1.5 MG/5ML PO SYRP: 5.0000 mL | ORAL_SOLUTION | Freq: Three times a day (TID) | ORAL | Status: DC | PRN

## 2013-05-07 NOTE — Progress Notes (Signed)
Chief Complaint  Patient presents with  . Cough    congestion, hoarseness, fever, fever blister, chest tightness, yellow mucus     HPI:  Acute visit for:  1) cough and congestion: -started: about 5 days ago -symptoms: nasal congestion, cough, hoarseness, a little tightness when coughed yesterday - albuterol helped this, yellow mucus -denies: fevers, SOB, sinus or ear pain, NVD -grandson sick 2 weeks ago -no hx chronic lung disease per her report - uses alb occ with viral illness  ROS: See pertinent positives and negatives per HPI.  Past Medical History  Diagnosis Date  . GERD (gastroesophageal reflux disease)   . Hyperlipidemia   . Hypertension   . Unspecified glaucoma(365.9)   . Anxiety   . CAD (coronary artery disease)   . Nephrolithiasis     Past Surgical History  Procedure Laterality Date  . Hernia repair    . Hemicolection in 1999 during exploratory surgery     . Abdominal exploration surgery    . Ventral hernia repair    . Incisional hernia repair      Family History  Problem Relation Age of Onset  . Heart disease Mother   . Heart disease Father   . Heart disease Brother     History   Social History  . Marital Status: Married    Spouse Name: N/A    Number of Children: N/A  . Years of Education: N/A   Occupational History  . retired    Social History Main Topics  . Smoking status: Never Smoker   . Smokeless tobacco: None  . Alcohol Use: No  . Drug Use: No  . Sexual Activity: Yes    Birth Control/ Protection: None   Other Topics Concern  . None   Social History Narrative  . None    Current outpatient prescriptions:ALPRAZolam (XANAX) 0.5 MG tablet, TAKE (1) TABLET BY MOUTH TWICE DAILY AS NEEDED FOR ANXIETY., Disp: 60 tablet, Rfl: 3;  aspirin EC 81 MG tablet, Take 81 mg by mouth daily., Disp: , Rfl: ;  ezetimibe (ZETIA) 10 MG tablet, Take 1 tablet (10 mg total) by mouth daily., Disp: 90 tablet, Rfl: 3;  ezetimibe-simvastatin (VYTORIN) 10-20 MG  per tablet, Take 1 tablet by mouth at bedtime., Disp: 30 tablet, Rfl: prn ibuprofen (ADVIL,MOTRIN) 800 MG tablet, Take 1 tablet (800 mg total) by mouth 3 (three) times daily., Disp: 90 tablet, Rfl: 1;  ibuprofen (ADVIL,MOTRIN) 800 MG tablet, Take 1 tablet (800 mg total) by mouth 3 (three) times daily., Disp: 21 tablet, Rfl: 0;  ibuprofen (ADVIL,MOTRIN) 800 MG tablet, TAKE (1) TABLET BY MOUTH (3) TIMES DAILY., Disp: 90 tablet, Rfl: 3 isosorbide mononitrate (IMDUR) 30 MG 24 hr tablet, TAKE ONE TABLET BY MOUTH ONCE DAILY., Disp: 30 tablet, Rfl: 6;  lisinopril (PRINIVIL,ZESTRIL) 20 MG tablet, Take 20 mg by mouth daily., Disp: , Rfl: ;  simvastatin (ZOCOR) 20 MG tablet, 1 every day, Disp: 90 tablet, Rfl: 3;  timolol (BETIMOL) 0.5 % ophthalmic solution, Place 1 drop into both eyes 2 (two) times daily. , Disp: , Rfl: ;  timolol (TIMOPTIC) 0.5 % ophthalmic solution, , Disp: , Rfl:  triamcinolone cream (KENALOG) 0.5 %, Apply 1 application topically as directed., Disp: , Rfl: ;  triamterene-hydrochlorothiazide (MAXZIDE-25) 37.5-25 MG per tablet, TAKE ONE TABLET DAILY AS DIRECTED., Disp: 30 tablet, Rfl: 6;  VENTOLIN HFA 108 (90 BASE) MCG/ACT inhaler, USE 2 PUFFS EVERY 6 HOURS AS NEEDED FOR WHEEZING., Disp: 18 g, Rfl: 6;  verapamil (COVERA HS) 180  MG (CO) 24 hr tablet, Take 180 mg by mouth daily., Disp: , Rfl:  HYDROcodone-homatropine (HYCODAN) 5-1.5 MG/5ML syrup, Take 5 mLs by mouth every 8 (eight) hours as needed for cough., Disp: 120 mL, Rfl: 0  EXAM:  Filed Vitals:   05/07/13 0908  BP: 120/74  Pulse: 70  Temp: 98.3 F (36.8 C)    Body mass index is 30.52 kg/(m^2).  GENERAL: vitals reviewed and listed above, alert, oriented, appears well hydrated and in no acute distress  HEENT: atraumatic, conjunttiva clear, no obvious abnormalities on inspection of external nose and ears, normal appearance of ear canals and TMs, clear nasal congestion, mild post oropharyngeal erythema with PND, no tonsillar edema or  exudate, no sinus TTP  NECK: no obvious masses on inspection  LUNGS: clear to auscultation bilaterally, no wheezes, rales or rhonchi, good air movement  CV: HRRR, no peripheral edema  MS: moves all extremities without noticeable abnormality  PSYCH: pleasant and cooperative, no obvious depression or anxiety  ASSESSMENT AND PLAN:  Discussed the following assessment and plan:  Upper respiratory infection - Plan: HYDROcodone-homatropine (HYCODAN) 5-1.5 MG/5ML syrup  -lung clear, no fever, likely viral and advise supportive care and return precautions - follow up precautions, cough medication - risks discussed -Patient advised to return or notify a doctor immediately if symptoms worsen or persist or new concerns arise.  Patient Instructions  INSTRUCTIONS FOR UPPER RESPIRATORY INFECTION:  -plenty of rest and fluids  -use your albuterol if any chest tightness or wheezing  -As we discussed, we have prescribed a new medication (Cough medication) for you at this appointment. We discussed the common and serious potential adverse effects of this medication and you can review these and more with the pharmacist when you pick up your medication.  Please follow the instructions for use carefully and notify us immediately if you have any problems taking this medication.  -nasal saline wash 2-3 times daily (use prepackaged nasal saline or bottled/distilled water if making your own)   -can use AFRIN nasal spray for drainage and nasal congestion - but do NOT use longer then 3-4 days  -can use tylenol or ibuprofen as directed for aches and sorethroat  -in the winter time, using a humidifier at night is helpful (please follow cleaning instructions)  -if you are taking a cough medication - use only as directed, may also try a teaspoon of honey to coat the throat and throat lozenges  -for sore throat, salt water gargles can help  -follow up if you have fevers, facial pain, tooth pain, difficulty  breathing or are worsening or not getting better in 5-7 days      Anab Vivar R.

## 2013-05-07 NOTE — Patient Instructions (Signed)
INSTRUCTIONS FOR UPPER RESPIRATORY INFECTION:  -plenty of rest and fluids  -use your albuterol if any chest tightness or wheezing  -As we discussed, we have prescribed a new medication (Cough medication) for you at this appointment. We discussed the common and serious potential adverse effects of this medication and you can review these and more with the pharmacist when you pick up your medication.  Please follow the instructions for use carefully and notify us immediately if you have any problems taking this medication.  -nasal saline wash 2-3 times daily (use prepackaged nasal saline or bottled/distilled water if making your own)   -can use AFRIN nasal spray for drainage and nasal congestion - but do NOT use longer then 3-4 days  -can use tylenol or ibuprofen as directed for aches and sorethroat  -in the winter time, using a humidifier at night is helpful (please follow cleaning instructions)  -if you are taking a cough medication - use only as directed, may also try a teaspoon of honey to coat the throat and throat lozenges  -for sore throat, salt water gargles can help  -follow up if you have fevers, facial pain, tooth pain, difficulty breathing or are worsening or not getting better in 5-7 days

## 2013-05-14 ENCOUNTER — Ambulatory Visit (INDEPENDENT_AMBULATORY_CARE_PROVIDER_SITE_OTHER): Payer: Medicare Other | Admitting: Internal Medicine

## 2013-05-14 ENCOUNTER — Encounter: Payer: Self-pay | Admitting: Internal Medicine

## 2013-05-14 VITALS — BP 140/70 | HR 72 | Temp 98.6°F | Resp 16 | Ht 66.0 in | Wt 186.0 lb

## 2013-05-14 DIAGNOSIS — J441 Chronic obstructive pulmonary disease with (acute) exacerbation: Secondary | ICD-10-CM

## 2013-05-14 DIAGNOSIS — J019 Acute sinusitis, unspecified: Secondary | ICD-10-CM | POA: Diagnosis not present

## 2013-05-14 DIAGNOSIS — I10 Essential (primary) hypertension: Secondary | ICD-10-CM

## 2013-05-14 MED ORDER — AZITHROMYCIN 250 MG PO TABS: ORAL_TABLET | ORAL | Status: DC

## 2013-05-14 NOTE — Progress Notes (Signed)
Subjective:    Patient ID: Hannah Reynolds, female    DOB: [birth year], 67 y.o.   MRN: [id number]  HPI Is a 67 year old female is followed for hypertension hyperlipidemia history of gastroesophageal reflux was recently treated for upper respiratory tract infection that was felt to be viral in etiology she has had cough and congestion she feels that it is improving head congestion   Review of Systems  Constitutional: Positive for fatigue.  HENT: Positive for hearing loss, congestion, rhinorrhea, postnasal drip, sinus pressure and tinnitus.   Eyes: Negative.   Respiratory: Positive for cough and shortness of breath.   Cardiovascular: Negative.   Endocrine: Negative.   Genitourinary: Negative.   Musculoskeletal: Negative.    Past Medical History  Diagnosis Date  . GERD (gastroesophageal reflux disease)   . Hyperlipidemia   . Hypertension   . Unspecified glaucoma(365.9)   . Anxiety   . CAD (coronary artery disease)   . Nephrolithiasis     History   Social History  . Marital Status: Married    Spouse Name: N/A    Number of Children: N/A  . Years of Education: N/A   Occupational History  . retired    Social History Main Topics  . Smoking status: Never Smoker   . Smokeless tobacco: Not on file  . Alcohol Use: No  . Drug Use: No  . Sexual Activity: Yes    Birth Control/ Protection: None   Other Topics Concern  . Not on file   Social History Narrative  . No narrative on file    Past Surgical History  Procedure Laterality Date  . Hernia repair    . Hemicolection in 1999 during exploratory surgery     . Abdominal exploration surgery    . Ventral hernia repair    . Incisional hernia repair      Family History  Problem Relation Age of Onset  . Heart disease Mother   . Heart disease Father   . Heart disease Brother     No Known Allergies  Current Outpatient Prescriptions on File Prior to Visit  Medication Sig Dispense Refill  . ALPRAZolam (XANAX) 0.5 MG  tablet TAKE (1) TABLET BY MOUTH TWICE DAILY AS NEEDED FOR ANXIETY.  60 tablet  3  . aspirin EC 81 MG tablet Take 81 mg by mouth daily.      Marland Kitchen ezetimibe (ZETIA) 10 MG tablet Take 1 tablet (10 mg total) by mouth daily.  90 tablet  3  . ibuprofen (ADVIL,MOTRIN) 800 MG tablet TAKE (1) TABLET BY MOUTH (3) TIMES DAILY.  90 tablet  3  . isosorbide mononitrate (IMDUR) 30 MG 24 hr tablet TAKE ONE TABLET BY MOUTH ONCE DAILY.  30 tablet  6  . lisinopril (PRINIVIL,ZESTRIL) 20 MG tablet Take 20 mg by mouth daily.      . simvastatin (ZOCOR) 20 MG tablet 1 every day  90 tablet  3  . timolol (BETIMOL) 0.5 % ophthalmic solution Place 1 drop into both eyes 2 (two) times daily.       Marland Kitchen triamcinolone cream (KENALOG) 0.5 % Apply 1 application topically as directed.      . triamterene-hydrochlorothiazide (MAXZIDE-25) 37.5-25 MG per tablet TAKE ONE TABLET DAILY AS DIRECTED.  30 tablet  6  . VENTOLIN HFA 108 (90 BASE) MCG/ACT inhaler USE 2 PUFFS EVERY 6 HOURS AS NEEDED FOR WHEEZING.  18 g  6  . verapamil (COVERA HS) 180 MG (CO) 24 hr tablet Take 180 mg by mouth daily.  No current facility-administered medications on file prior to visit.    BP 140/70  Pulse 72  Temp(Src) 98.6 F (37 C)  Resp 16  Ht 5\' 6"  (1.676 m)  Wt 186 lb (84.369 kg)  BMI 30.04 kg/m2        Objective:   Physical Exam  Constitutional: She is oriented to person, place, and time. She appears well-developed and well-nourished. No distress.  HENT:  Head: Normocephalic and atraumatic.  Eyes: Conjunctivae and EOM are normal. Pupils are equal, round, and reactive to light.  Neck: Normal range of motion. Neck supple. No JVD present. No tracheal deviation present. No thyromegaly present.  Cardiovascular: Normal rate, regular rhythm and intact distal pulses.   Pulmonary/Chest: Effort normal and breath sounds normal. She has no wheezes. She exhibits no tenderness.  Abdominal: Soft. Bowel sounds are normal.  Musculoskeletal: Normal range of  motion. She exhibits no edema and no tenderness.  Lymphadenopathy:    She has no cervical adenopathy.  Neurological: She is alert and oriented to person, place, and time. She has normal reflexes. No cranial nerve deficit.  Skin: Skin is warm and dry. She is not diaphoretic.  Psychiatric: She has a normal mood and affect. Her behavior is normal.          Assessment & Plan:  Patient's upper respiratory tract infection has migrated to her sinuses and now appears to be bacterial in etiology I believe she will require an antibiotic will send and azithromycin to her local pharmacy.  Otherwise her blood pressure stable she stable on current medication We will defer management of her lipids at this time due to the stress of the infection would influence the validity of the readings

## 2013-05-14 NOTE — Patient Instructions (Signed)
The patient is instructed to continue all medications as prescribed. Schedule followup with check out clerk upon leaving the clinic  

## 2013-05-20 ENCOUNTER — Other Ambulatory Visit: Payer: Self-pay | Admitting: Internal Medicine

## 2013-05-26 ENCOUNTER — Other Ambulatory Visit: Payer: Self-pay | Admitting: Internal Medicine

## 2013-06-19 ENCOUNTER — Other Ambulatory Visit: Payer: Self-pay

## 2013-07-28 ENCOUNTER — Other Ambulatory Visit: Payer: Self-pay | Admitting: Internal Medicine

## 2013-09-06 ENCOUNTER — Other Ambulatory Visit: Payer: Self-pay | Admitting: Internal Medicine

## 2013-09-23 ENCOUNTER — Other Ambulatory Visit: Payer: Self-pay | Admitting: Internal Medicine

## 2013-10-04 ENCOUNTER — Other Ambulatory Visit: Payer: Self-pay | Admitting: Internal Medicine

## 2013-11-12 ENCOUNTER — Ambulatory Visit (INDEPENDENT_AMBULATORY_CARE_PROVIDER_SITE_OTHER): Payer: Medicare Other | Admitting: Internal Medicine

## 2013-11-12 ENCOUNTER — Telehealth: Payer: Self-pay | Admitting: Internal Medicine

## 2013-11-12 ENCOUNTER — Encounter: Payer: Self-pay | Admitting: Internal Medicine

## 2013-11-12 VITALS — BP 120/90 | HR 77 | Temp 97.8°F | Ht 66.0 in | Wt 190.0 lb

## 2013-11-12 DIAGNOSIS — Z79899 Other long term (current) drug therapy: Secondary | ICD-10-CM

## 2013-11-12 DIAGNOSIS — T887XXA Unspecified adverse effect of drug or medicament, initial encounter: Secondary | ICD-10-CM

## 2013-11-12 DIAGNOSIS — I10 Essential (primary) hypertension: Secondary | ICD-10-CM | POA: Diagnosis not present

## 2013-11-12 DIAGNOSIS — E785 Hyperlipidemia, unspecified: Secondary | ICD-10-CM | POA: Diagnosis not present

## 2013-11-12 LAB — BASIC METABOLIC PANEL
BUN: 14 mg/dL (ref 6–23)
CHLORIDE: 99 meq/L (ref 96–112)
CO2: 28 mEq/L (ref 19–32)
CREATININE: 0.8 mg/dL (ref 0.4–1.2)
Calcium: 9.4 mg/dL (ref 8.4–10.5)
GFR: 76.34 mL/min (ref >60.00)
Glucose, Bld: 84 mg/dL (ref 70–99)
Potassium: 4.1 mEq/L (ref 3.5–5.1)
Sodium: 136 mEq/L (ref 135–145)

## 2013-11-12 LAB — HEPATIC FUNCTION PANEL
ALBUMIN: 4.2 g/dL (ref 3.5–5.2)
ALK PHOS: 84 U/L (ref 39–117)
ALT: 15 U/L (ref 0–35)
AST: 21 U/L (ref 0–37)
BILIRUBIN DIRECT: 0.1 mg/dL (ref 0.0–0.3)
TOTAL PROTEIN: 7.5 g/dL (ref 6.0–8.3)
Total Bilirubin: 0.7 mg/dL (ref 0.3–1.2)

## 2013-11-12 LAB — CBC WITH DIFFERENTIAL/PLATELET
BASOS PCT: 0.4 % (ref 0.0–3.0)
Basophils Absolute: 0 10*3/uL (ref 0.0–0.1)
EOS PCT: 4.9 % (ref 0.0–5.0)
Eosinophils Absolute: 0.4 10*3/uL (ref 0.0–0.7)
HCT: 37.1 % (ref 36.0–46.0)
Hemoglobin: 12.5 g/dL (ref 12.0–15.0)
LYMPHS PCT: 22.6 % (ref 12.0–46.0)
Lymphs Abs: 2 10*3/uL (ref 0.7–4.0)
MCHC: 33.6 g/dL (ref 30.0–36.0)
MCV: 95.7 fl (ref 78.0–100.0)
MONOS PCT: 8.6 % (ref 3.0–12.0)
Monocytes Absolute: 0.8 10*3/uL (ref 0.1–1.0)
NEUTROS PCT: 63.5 % (ref 43.0–77.0)
Neutro Abs: 5.7 10*3/uL (ref 1.4–7.7)
PLATELETS: 325 10*3/uL (ref 150.0–400.0)
RBC: 3.87 Mil/uL (ref 3.87–5.11)
RDW: 13.8 % (ref 11.5–14.6)
WBC: 8.9 10*3/uL (ref 4.5–10.5)

## 2013-11-12 LAB — LIPID PANEL
CHOLESTEROL: 157 mg/dL (ref 0–200)
HDL: 42.3 mg/dL (ref >39.00)
LDL CALC: 82 mg/dL (ref 0–99)
TRIGLYCERIDES: 164 mg/dL — AB (ref 0.0–149.0)
Total CHOL/HDL Ratio: 4
VLDL: 32.8 mg/dL (ref 0.0–40.0)

## 2013-11-12 LAB — TSH: TSH: 4 u[IU]/mL (ref 0.35–5.50)

## 2013-11-12 NOTE — Patient Instructions (Signed)
The patient is instructed to continue all medications as prescribed. Schedule followup with check out clerk upon leaving the clinic  

## 2013-11-12 NOTE — Progress Notes (Signed)
Subjective:    Patient ID: Hannah Reynolds, female    DOB: 17-Apr-1946, 68 y.o.   MRN: [id number]  HPI  No reported CP Needs lipid and liver and CBC for drug management  Review of Systems  Constitutional: Negative.   Eyes: Negative.   Respiratory: Negative.   Cardiovascular: Negative.   Genitourinary: Negative.    Past Medical History  Diagnosis Date  . GERD (gastroesophageal reflux disease)   . Hyperlipidemia   . Hypertension   . Unspecified glaucoma   . Anxiety   . CAD (coronary artery disease)   . Nephrolithiasis     History   Social History  . Marital Status: Married    Spouse Name: N/A    Number of Children: N/A  . Years of Education: N/A   Occupational History  . retired    Social History Main Topics  . Smoking status: Never Smoker   . Smokeless tobacco: Not on file  . Alcohol Use: No  . Drug Use: No  . Sexual Activity: Yes    Birth Control/ Protection: None   Other Topics Concern  . Not on file   Social History Narrative  . No narrative on file    Past Surgical History  Procedure Laterality Date  . Hernia repair    . Hemicolection in 1999 during exploratory surgery     . Abdominal exploration surgery    . Ventral hernia repair    . Incisional hernia repair      Family History  Problem Relation Age of Onset  . Heart disease Mother   . Heart disease Father   . Heart disease Brother     No Known Allergies  Current Outpatient Prescriptions on File Prior to Visit  Medication Sig Dispense Refill  . ALPRAZolam (XANAX) 0.5 MG tablet TAKE (1) TABLET BY MOUTH TWICE DAILY AS NEEDED FOR ANXIETY.  60 tablet  3  . aspirin EC 81 MG tablet Take 81 mg by mouth daily.      Marland Kitchen azithromycin (ZITHROMAX Z-PAK) 250 MG tablet Two po now then one a day  6 each  0  . ezetimibe (ZETIA) 10 MG tablet Take 1 tablet (10 mg total) by mouth daily.  90 tablet  3  . ibuprofen (ADVIL,MOTRIN) 800 MG tablet TAKE (1) TABLET BY MOUTH (3) TIMES DAILY.  90 tablet  3  .  isosorbide mononitrate (IMDUR) 30 MG 24 hr tablet TAKE ONE TABLET BY MOUTH ONCE DAILY.  30 tablet  11  . lisinopril (PRINIVIL,ZESTRIL) 20 MG tablet TAKE ONE TABLET BY MOUTH ONCE DAILY.  30 tablet  11  . simvastatin (ZOCOR) 20 MG tablet 1 every day  90 tablet  3  . timolol (BETIMOL) 0.5 % ophthalmic solution Place 1 drop into both eyes 2 (two) times daily.       Marland Kitchen triamcinolone cream (KENALOG) 0.5 % Apply 1 application topically as directed.      . triamterene-hydrochlorothiazide (MAXZIDE-25) 37.5-25 MG per tablet TAKE ONE TABLET DAILY AS DIRECTED.  30 tablet  6  . VENTOLIN HFA 108 (90 BASE) MCG/ACT inhaler USE 2 PUFFS EVERY 6 HOURS AS NEEDED FOR WHEEZING.  18 g  6   No current facility-administered medications on file prior to visit.    BP 120/90  Pulse 77  Temp(Src) 97.8 F (36.6 C) (Oral)  Ht 5\' 6"  (1.676 m)  Wt 190 lb (86.183 kg)  BMI 30.68 kg/m2  SpO2 96%       Objective:   Physical  Exam  Constitutional: She appears well-nourished.  HENT:  Head: Normocephalic and atraumatic.  Eyes: Conjunctivae are normal. Pupils are equal, round, and reactive to light.  Neck: Neck supple.  Cardiovascular: Normal rate and regular rhythm.   Murmur heard. Pulmonary/Chest: Effort normal and breath sounds normal.  Abdominal: Soft. Bowel sounds are normal.           Assessment & Plan:  Due screening blood work for medication management Stable CAD and Asthma

## 2013-11-12 NOTE — Progress Notes (Signed)
Pre visit review using our clinic review tool, if applicable. No additional management support is needed unless otherwise documented below in the visit note. 

## 2013-11-12 NOTE — Telephone Encounter (Signed)
Relevant patient education assigned to patient using Emmi. ° °

## 2013-12-19 ENCOUNTER — Telehealth: Payer: Self-pay | Admitting: Internal Medicine

## 2013-12-19 MED ORDER — ALPRAZOLAM 0.5 MG PO TABS: ORAL_TABLET | ORAL | Status: DC

## 2013-12-19 NOTE — Telephone Encounter (Signed)
Ok per Dr Arnoldo Morale, rx faxed to [facility]

## 2013-12-19 NOTE — Telephone Encounter (Signed)
[facility] is requesting re-fill on ALPRAZolam (XANAX) 0.5 MG tablet

## 2014-01-02 ENCOUNTER — Other Ambulatory Visit: Payer: Self-pay | Admitting: Internal Medicine

## 2014-01-21 DIAGNOSIS — I10 Essential (primary) hypertension: Secondary | ICD-10-CM | POA: Diagnosis not present

## 2014-01-21 DIAGNOSIS — M25569 Pain in unspecified knee: Secondary | ICD-10-CM | POA: Diagnosis not present

## 2014-02-03 DIAGNOSIS — M25569 Pain in unspecified knee: Secondary | ICD-10-CM | POA: Diagnosis not present

## 2014-02-03 DIAGNOSIS — M238X9 Other internal derangements of unspecified knee: Secondary | ICD-10-CM | POA: Diagnosis not present

## 2014-02-03 DIAGNOSIS — I10 Essential (primary) hypertension: Secondary | ICD-10-CM | POA: Diagnosis not present

## 2014-02-09 DIAGNOSIS — M25569 Pain in unspecified knee: Secondary | ICD-10-CM | POA: Diagnosis not present

## 2014-02-12 DIAGNOSIS — M25569 Pain in unspecified knee: Secondary | ICD-10-CM | POA: Diagnosis not present

## 2014-02-12 DIAGNOSIS — I10 Essential (primary) hypertension: Secondary | ICD-10-CM | POA: Diagnosis not present

## 2014-03-05 ENCOUNTER — Other Ambulatory Visit: Payer: Self-pay | Admitting: Internal Medicine

## 2014-03-16 DIAGNOSIS — M238X9 Other internal derangements of unspecified knee: Secondary | ICD-10-CM | POA: Diagnosis not present

## 2014-03-16 DIAGNOSIS — M25569 Pain in unspecified knee: Secondary | ICD-10-CM | POA: Diagnosis not present

## 2014-03-25 DIAGNOSIS — M13869 Other specified arthritis, unspecified knee: Secondary | ICD-10-CM | POA: Diagnosis not present

## 2014-03-25 DIAGNOSIS — M13169 Monoarthritis, not elsewhere classified, unspecified knee: Secondary | ICD-10-CM | POA: Diagnosis not present

## 2014-03-27 ENCOUNTER — Ambulatory Visit (HOSPITAL_COMMUNITY)
Admission: RE | Admit: 2014-03-27 | Discharge: 2014-03-27 | Disposition: A | Discharge status: Home / Self Care | Payer: Medicare Other | Source: Ambulatory Visit | Attending: Orthopedic Surgery | Admitting: Orthopedic Surgery

## 2014-03-27 DIAGNOSIS — S838X1A Sprain of other specified parts of right knee, initial encounter: Secondary | ICD-10-CM

## 2014-03-27 DIAGNOSIS — M25561 Pain in right knee: Secondary | ICD-10-CM

## 2014-03-27 DIAGNOSIS — M25669 Stiffness of unspecified knee, not elsewhere classified: Secondary | ICD-10-CM | POA: Insufficient documentation

## 2014-03-27 DIAGNOSIS — IMO0001 Reserved for inherently not codable concepts without codable children: Secondary | ICD-10-CM | POA: Diagnosis not present

## 2014-03-27 DIAGNOSIS — M25569 Pain in unspecified knee: Secondary | ICD-10-CM | POA: Insufficient documentation

## 2014-03-27 DIAGNOSIS — R262 Difficulty in walking, not elsewhere classified: Secondary | ICD-10-CM | POA: Insufficient documentation

## 2014-03-27 DIAGNOSIS — S838X9A Sprain of other specified parts of unspecified knee, initial encounter: Secondary | ICD-10-CM | POA: Insufficient documentation

## 2014-03-27 DIAGNOSIS — M6281 Muscle weakness (generalized): Secondary | ICD-10-CM

## 2014-03-27 NOTE — Evaluation (Signed)
Physical Therapy Evaluation  Patient Details  Name: Hannah Reynolds MRN: [id number] Date of Birth: [birth year]  Today's Date: 03/27/2014 Time: 1740-1820 PT Time Calculation (min): 40 min     Charges: 1 Evaluation         Visit#: 1 of 12  Re-eval: 04/26/14 Assessment Diagnosis: Rt knee pain secondary to knee stiffness Next MD Visit: Alvan Dame in October Prior Therapy: no  Authorization: Medicare     Past Medical History:  Past Medical History  Diagnosis Date  . GERD (gastroesophageal reflux disease)   . Hyperlipidemia   . Hypertension   . Unspecified glaucoma   . Anxiety   . CAD (coronary artery disease)   . Nephrolithiasis    Past Surgical History:  Past Surgical History  Procedure Laterality Date  . Hernia repair    . Hemicolection in 1999 during exploratory surgery     . Abdominal exploration surgery    . Ventral hernia repair    . Incisional hernia repair      Subjective Symptoms/Limitations Symptoms: Pain with knee givign way resulting in patient nearly passing out.  Pertinent History: Patient pain began in mid June, while cooking in kitchen. Patient noticed knee gave way and knee has hurt ever since. patient noptes that even with brace on knee still gives way. one of patient's MD's told patient that some of her meniscus/knee as "chipped off". History of 4 abdominal hernias, on ground for crawling.  How long can you sit comfortably?: minor pain How long can you stand comfortably?: with brace 15 minutes, scared to try stnaidng without it How long can you walk comfortably?: 13mintues with walker, did not use walker prior to knee pain Patient Stated Goals: Patient wants to be able to walk withotu waker/brace, feel more stable on her knee  Pain Assessment Currently in Pain?: Yes Pain Score: 1  Pain Location: Knee Pain Orientation: Right;Mid;Lateral Pain Type: Chronic pain Pain Onset: More than a month ago Pain Frequency: Constant Pain Relieving Factors: laying down  flat Effect of Pain on Daily Activities: standing walking   Cognition/Observation Observation/Other Assessments Observations: gait: limited hip, ankle and foot mobility secondary to antqalgic gait and limited weight bearign, bilateral pes planus, excessive calcaneal eversion and ankle eversion.   Sensation/Coordination/Flexibility/Functional Tests Flexibility 90/90: Positive Functional Tests Functional Tests: Pat Patrick: minimally positive Functional Tests: Piriformis: positive on Rt Functional Tests: Positive McMurrays on Rt knee for medial menmiscus, positive joint line tenderness, Positive thessaly test  Assessment RLE AROM (degrees) Right Hip Extension: 0 Right Hip Flexion: 125 Right Knee Extension: 0 Right Knee Flexion: 125 Right Ankle Dorsiflexion: 7 RLE Strength Right Hip Flexion: 3+/5 Right Hip External Rotation : 36 Right Hip Internal Rotation : 19 Right Knee Flexion: 3+/5 Right Knee Extension: 3+/5 Right Ankle Dorsiflexion: 4/5  Exercise/Treatments Sagittal plane knee driver 03K  Physical Therapy Assessment and Plan PT Assessment and Plan Clinical Impression Statement: Patient displays Rt knee pain and weakness secondary to liekly knee meniscus injury as indicated by positive disco(thessaly) test, positive McMurray test, joint line tenderness , positive history of knee buckling. Patient will benefit froms skileld phsyical therapy to increase Rt knee stability so patient can return to ambualting withou an assistive device.  Pt will benefit from skilled therapeutic intervention in order to improve on the following deficits: Decreased activity tolerance;Decreased balance;Difficulty walking;Decreased strength;Decreased range of motion;Decreased mobility;Increased fascial restricitons;Increased muscle spasms;Impaired flexibility;Pain;Abnormal gait Rehab Potential: Fair Clinical Impairments Affecting Rehab Potential: Research shows low likelihood of meniscus healing independent of  surgery if  torn.  PT Frequency: Min 3X/week PT Duration: 4 weeks PT Treatment/Interventions: Gait training;Stair training;Functional mobility training;Therapeutic activities;Therapeutic exercise;Balance training;Modalities;Manual techniques;Patient/family education PT Plan: initial focus on improving LE mobility, and improving gait mechanincs so patient can ambualte 72minutes withotu a walking. as mobility improves focus to quickly shift to addressing decreased Rt LE strength to increase Rt knee stability.     Goals Home Exercise Program Pt/caregiver will Perform Home Exercise Program: For increased ROM;For increased strengthening PT Goal: Perform Home Exercise Program - Progress: Goal set today PT Short Term Goals Time to Complete Short Term Goals: 4 weeks PT Short Term Goal 1: Patient will demosntrate increased ankle dorsiflexion to >15 degrees bilaterally. PT Short Term Goal 2: Patient will demosntrate increased hamstring trength to 4/5 MMT to increasek nee stability. PT Short Term Goal 3: Patient will demosntrate increased quadraceps strength to 4+/5 MMT to icnrease ease with sit to stand. PT Short Term Goal 4: Patient will demonstrate increased hip internal rotation to 35 degrees bilaterally PT Short Term Goal 5: Patient will be able to walk 30 minutes withtou and assistive device and no pain/   Problem List Patient Active Problem List   Diagnosis Date Noted  . Knee pain 03/27/2014  . Muscle weakness (generalized) 03/27/2014  . Meniscal injury 03/27/2014  . SKIN RASH 04/13/2009  . FATIGUE 08/29/2007  . DEPENDENT EDEMA, LEGS 03/27/2007  . SYMPTOM, SWELLING, ABDOMINAL, GENERALIZED 03/27/2007  . HYPERLIPIDEMIA 03/14/2007  . ANXIETY 03/14/2007  . GLAUCOMA NOS 03/14/2007  . HYPERTENSION 03/14/2007  . GERD 03/14/2007  . NEPHROLITHIASIS, HX OF 03/14/2007    PT - End of Session Activity Tolerance: Patient tolerated treatment well General Behavior During Therapy: WFL for tasks  assessed/performed PT Plan of Care PT Home Exercise Plan: sagittal plane knee driver on step, LE stretches, piriformis stretch, 3D hip excursions Consulted and Agree with Plan of Care: Patient  GP Functional Assessment Tool Used: FOTO 72% limited Functional Limitation: Mobility: Walking and moving around Mobility: Walking and Moving Around Current Status (U4403): At least 60 percent but less than 80 percent impaired, limited or restricted Mobility: Walking and Moving Around Goal Status [phone]): At least 40 percent but less than 60 percent impaired, limited or restricted  Leia Alf 03/27/2014, 6:58 PM  Physician Documentation Your signature is required to indicate approval of the treatment plan as stated above.  Please sign and either send electronically or make a copy of this report for your files and return this physician signed original.   Please mark one 1.__approve of plan  2. ___approve of plan with the following conditions.   ______________________________                                                          _____________________ Physician Signature  Date  

## 2014-03-30 ENCOUNTER — Ambulatory Visit (HOSPITAL_COMMUNITY)
Admission: RE | Admit: 2014-03-30 | Discharge: 2014-03-30 | Disposition: A | Discharge status: Home / Self Care | Payer: Medicare Other | Source: Ambulatory Visit | Attending: Orthopedic Surgery | Admitting: Orthopedic Surgery

## 2014-03-30 DIAGNOSIS — IMO0001 Reserved for inherently not codable concepts without codable children: Secondary | ICD-10-CM | POA: Diagnosis not present

## 2014-03-30 NOTE — Progress Notes (Addendum)
Physical Therapy Treatment Patient Details  Name: Hannah Reynolds MRN: [id number] Date of Birth: 1945/11/15  Today's Date: 03/30/2014 Time: 2831-5176 PT Time Calculation (min): 42 min Charge: Gt 160-737  There ex 106-269 Visit#: 2 of 12  Re-eval: 04/26/14    Authorization: Medicare  Subjective: Symptoms/Limitations Symptoms: Pt states that she thinks that she might have overdone it over the weekend.   Pain Assessment Currently in Pain?: Yes Pain Score: 3  Pain Location: Knee Pain Orientation: Right;Lateral Pain Type: Chronic pain    Exercise/Treatments   Stretches Gastroc Stretch: 3 reps;30 seconds (slant board.) Aerobic Stationary Bike: nustep L4 x 5'    Standing Heel Raises: 10 reps Knee Flexion: 10 reps (3#) Lateral Step Up: 10 reps Functional Squat: 10 reps Rocker Board: 2 minutes SLS with Vectors: 3x 10" with 2 finger hold  Gait Training: with cane x 226'  Physical Therapy Assessment and Plan PT Assessment and Plan Clinical Impression Statement: All exercises were facilitated by therapist.  Noted weakness with rockerboard as well as lateral step ups.  Pt able to obtain a normalized gait with cane with verbal cuing.  Pt encouraged to purchase a cane and begin walking with a cane in her home  PT Plan: begin forward lunge as well as forward step up    Goals    Problem List Patient Active Problem List   Diagnosis Date Noted  . Knee pain 03/27/2014  . Muscle weakness (generalized) 03/27/2014  . Meniscal injury 03/27/2014  . SKIN RASH 04/13/2009  . FATIGUE 08/29/2007  . DEPENDENT EDEMA, LEGS 03/27/2007  . SYMPTOM, SWELLING, ABDOMINAL, GENERALIZED 03/27/2007  . HYPERLIPIDEMIA 03/14/2007  . ANXIETY 03/14/2007  . GLAUCOMA NOS 03/14/2007  . HYPERTENSION 03/14/2007  . GERD 03/14/2007  . NEPHROLITHIASIS, HX OF 03/14/2007    PT - End of Session Activity Tolerance: Patient tolerated treatment well General Behavior During Therapy: WFL for tasks  assessed/performed  GP    RUSSELL,CINDY 03/30/2014, 9:52 AM

## 2014-04-08 ENCOUNTER — Ambulatory Visit (HOSPITAL_COMMUNITY)
Admission: RE | Admit: 2014-04-08 | Discharge: 2014-04-08 | Disposition: A | Discharge status: Home / Self Care | Payer: Medicare Other | Source: Ambulatory Visit | Attending: Orthopedic Surgery | Admitting: Orthopedic Surgery

## 2014-04-08 DIAGNOSIS — IMO0001 Reserved for inherently not codable concepts without codable children: Secondary | ICD-10-CM | POA: Diagnosis not present

## 2014-04-08 NOTE — Progress Notes (Signed)
Physical Therapy Treatment Patient Details  Name: Hannah Reynolds MRN: [id number] Date of Birth: 1946-05-21  Today's Date: 04/08/2014 Time: 8338-2505 PT Time Calculation (min): 50 min Charge: TE [id number]  Visit#: 3 of 12  Re-eval: 04/26/14 Assessment Diagnosis: Rt knee pain secondary to knee stiffness Next MD Visit: Alvan Dame in October Prior Therapy: no  Authorization: Medicare  Authorization Time Period:    Authorization Visit#:   of     Subjective: Symptoms/Limitations Symptoms: Pt stated she feels she is getting stronger.  Pain scale 2/10 hamstring insertion point and anterior knee.   Pain Assessment Currently in Pain?: Yes Pain Score: 2  Pain Location: Knee Pain Orientation: Right  Objective:  Exercise/Treatments Stretches Active Hamstring Stretch: 3 reps;30 seconds;Limitations Active Hamstring Stretch Limitations: standing 3 directions on 14 in step Gastroc Stretch: 3 reps;30 seconds;Limitations Gastroc Stretch Limitations: slant board Aerobic Stationary Bike: nustep L4 x 5'  Standing Heel Raises: 10 reps;Limitations Heel Raises Limitations: Toe raises Forward Lunges: Both;10 reps;Limitations Forward Lunges Limitations: 8in step Lateral Step Up: Right;10 reps;Hand Hold: 2;Step Height: 4" Forward Step Up: Right;10 reps;Hand Hold: 2;Step Height: 4" Functional Squat: 10 reps;Limitations Functional Squat Limitations: 3D hip excursion Rocker Board: 2 minutes Gait Training: with cane x 226'    Physical Therapy Assessment and Plan PT Assessment and Plan Clinical Impression Statement: Pt educated on proper hand use with SPC, able to demonstrate proper gait mechanics and sequencing with cane with min cueing for equalized stride length and heel to toe pattern.  Added 3D hip excursion for hip mobilty wtih gait.  Added hamstring stretches for knee extension with noted tension postioer knee  near hamstring insertion point.  Progressed quad strengthening with stair training  with cueing for technique.  Pt reported no increased pain at end of session, encouraged pt to apply ice on knee at home for pain and edema control.   PT Plan: Continue with current POC, decrease step height with forward lunges for increased knee loading.  Progress stair training as able.      Goals PT Short Term Goals PT Short Term Goal 1: Patient will demosntrate increased ankle dorsiflexion to >15 degrees bilaterally. PT Short Term Goal 1 - Progress: Progressing toward goal PT Short Term Goal 2: Patient will demosntrate increased hamstring trength to 4/5 MMT to increasek nee stability. PT Short Term Goal 2 - Progress: Progressing toward goal PT Short Term Goal 3: Patient will demosntrate increased quadraceps strength to 4+/5 MMT to icnrease ease with sit to stand. PT Short Term Goal 3 - Progress: Progressing toward goal PT Short Term Goal 4: Patient will demonstrate increased hip internal rotation to 35 degrees bilaterally PT Short Term Goal 5: Patient will be able to walk 30 minutes withtou and assistive device and no pain/  PT Short Term Goal 5 - Progress: Progressing toward goal  Problem List Patient Active Problem List   Diagnosis Date Noted  . Knee pain 03/27/2014  . Muscle weakness (generalized) 03/27/2014  . Meniscal injury 03/27/2014  . SKIN RASH 04/13/2009  . FATIGUE 08/29/2007  . DEPENDENT EDEMA, LEGS 03/27/2007  . SYMPTOM, SWELLING, ABDOMINAL, GENERALIZED 03/27/2007  . HYPERLIPIDEMIA 03/14/2007  . ANXIETY 03/14/2007  . GLAUCOMA NOS 03/14/2007  . HYPERTENSION 03/14/2007  . GERD 03/14/2007  . NEPHROLITHIASIS, HX OF 03/14/2007    PT - End of Session Equipment Utilized During Treatment: Gait belt Activity Tolerance: Patient tolerated treatment well General Behavior During Therapy: [facility] for tasks assessed/performed  GP    Aldona Lento 04/08/2014, 2:55  PM

## 2014-04-10 ENCOUNTER — Ambulatory Visit (HOSPITAL_COMMUNITY)
Admission: RE | Admit: 2014-04-10 | Discharge: 2014-04-10 | Disposition: A | Discharge status: Home / Self Care | Payer: Medicare Other | Source: Ambulatory Visit | Attending: Orthopedic Surgery | Admitting: Orthopedic Surgery

## 2014-04-10 DIAGNOSIS — IMO0001 Reserved for inherently not codable concepts without codable children: Secondary | ICD-10-CM | POA: Diagnosis not present

## 2014-04-10 NOTE — Progress Notes (Signed)
Physical Therapy Treatment Patient Details  Name: Hannah Reynolds MRN: [id number] Date of Birth: [birth year]  Today's Date: 04/10/2014 Time: 7253-6644 PT Time Calculation (min): 48 min   Charges: TE [id number], Manual [id number] Visit#: 4 of 12  Re-eval: 04/26/14 Assessment Diagnosis: Rt knee pain secondary to knee stiffness Next MD Visit: Alvan Dame in October Prior Therapy: no  Authorization: Medicare   Subjective: Symptoms/Limitations Symptoms: Patient states that she has been really sore after her last session. Pain noted today during transverse plane hip excursions in Rt medial knee Pain Assessment Currently in Pain?: Yes Pain Score: 5  Pain Location: Knee Pain Orientation: Right;Medial  Exercise/Treatments Stretches Active Hamstring Stretch: Limitations Active Hamstring Stretch Limitations: standing 3 directions on 14 in step 10x 3 seconds Quad Stretch: 4 reps;30 seconds;Limitations Quad Stretch Limitations: prone Hip Flexor Stretch: Limitations Hip Flexor Stretch Limitations: 10x 3seconds to 14" box Gastroc Stretch: 3 reps;30 seconds;Limitations Gastroc Stretch Limitations: slant board Standing Forward Lunges: Both;10 reps;Limitations Forward Lunges Limitations: 8in step Functional Squat Limitations: 3D hip excursion 10x, Squat walk around 10x   Manual therapy: hip internal rotation mobilizations, tibial distraction, Talus anterior to posterior glides  Physical Therapy Assessment and Plan PT Assessment and Plan Clinical Impression Statement: Session focused on increasing knee mobility and hip internal rotation and talus internal rotation to improve transition zone one mechanics to allow for better ability to decelerate landing durign gait. Patient will perform strengthening exercises again next session to improve knee stability. Patient displayed improved gait at end of session noting decreased pain.  PT Plan: Continue with current POC, decrease step height with forward  lunges for increased knee loading.  progress hip internal rotation to improve gait mechanics. Introduce big reach walks and 2D walking hip excursions next session.      Problem List Patient Active Problem List   Diagnosis Date Noted  . Knee pain 03/27/2014  . Muscle weakness (generalized) 03/27/2014  . Meniscal injury 03/27/2014  . SKIN RASH 04/13/2009  . FATIGUE 08/29/2007  . DEPENDENT EDEMA, LEGS 03/27/2007  . SYMPTOM, SWELLING, ABDOMINAL, GENERALIZED 03/27/2007  . HYPERLIPIDEMIA 03/14/2007  . ANXIETY 03/14/2007  . GLAUCOMA NOS 03/14/2007  . HYPERTENSION 03/14/2007  . GERD 03/14/2007  . NEPHROLITHIASIS, HX OF 03/14/2007    PT - End of Session Activity Tolerance: Patient tolerated treatment well General Behavior During Therapy: [facility] for tasks assessed/performed  GP    Darleen Moffitt R 04/10/2014, 4:08 PM

## 2014-04-14 ENCOUNTER — Ambulatory Visit (HOSPITAL_COMMUNITY)
Admission: RE | Admit: 2014-04-14 | Discharge: 2014-04-14 | Disposition: A | Discharge status: Home / Self Care | Payer: Medicare Other | Source: Ambulatory Visit | Attending: Orthopedic Surgery | Admitting: Orthopedic Surgery

## 2014-04-14 DIAGNOSIS — M25669 Stiffness of unspecified knee, not elsewhere classified: Secondary | ICD-10-CM | POA: Insufficient documentation

## 2014-04-14 DIAGNOSIS — IMO0001 Reserved for inherently not codable concepts without codable children: Secondary | ICD-10-CM | POA: Diagnosis not present

## 2014-04-14 DIAGNOSIS — R262 Difficulty in walking, not elsewhere classified: Secondary | ICD-10-CM | POA: Insufficient documentation

## 2014-04-14 DIAGNOSIS — M25569 Pain in unspecified knee: Secondary | ICD-10-CM | POA: Diagnosis not present

## 2014-04-14 NOTE — Progress Notes (Signed)
Physical Therapy Treatment Patient Details  Name: Hannah Reynolds MRN: [id number] Date of Birth: 02-27-1946  Today's Date: 04/14/2014 Time: 3532-9924 PT Time Calculation (min): 48 min Charge: TE [id number]   Visit#: 5 of 12  Re-eval: 04/26/14 Assessment Diagnosis: Rt knee pain secondary to knee stiffness Next MD Visit: Alvan Dame in October Prior Therapy: no  Authorization: Medicare  Authorization Time Period:    Authorization Visit#:   of     Subjective: Symptoms/Limitations Symptoms: Pt reported increased pain following last sessoin, stated she used her RW whole weekend.  Reports went walking in pool yesterday which really helped to loosen up.  Current pain scale 4/10 Rt knee  Pain Assessment Currently in Pain?: Yes Pain Score: 4  Pain Location: Knee Pain Orientation: Right;Anterior  Objective:  Exercise/Treatments Stretches Active Hamstring Stretch: Limitations;3 reps;30 seconds Active Hamstring Stretch Limitations: standing 3 directions on 14 in step  Gastroc Stretch: 3 reps;30 seconds;Limitations Gastroc Stretch Limitations: slant board Standing Functional Squat: 10 reps;Limitations Functional Squat Limitations: 3D hip excursion 10x,  Rocker Board: 2 minutes Gait Training: with cane x 226' Other Standing Knee Exercises: 2D walking hip excursion   Manual Therapy Manual Therapy: Joint mobilization Joint Mobilization: Patlla mobs all directions; fib/tib mobs  Physical Therapy Assessment and Plan PT Assessment and Plan Clinical Impression Statement: Session focus on improving weight distribution with standing and gait wtih SPC.  Added 2D walking excursion to improve weight shifting.  Pt stated pinching Rt knee while on rockerboard.  Palpated paletta maltracking due to quad weakness.  Patel;la mobs complete to improve tracking with no popping compliants following.   PT Plan: Continue with current POC, decrease step height with forward lunges for increased knee loading.   progress hip internal rotation to improve gait mechanics. Introduce big reach walks and continue 2D walking hip excursions next session.  Begin lateral step ups and wall squats with ball between knees for VMO strengthening.      Goals PT Short Term Goals PT Short Term Goal 1: Patient will demosntrate increased ankle dorsiflexion to >15 degrees bilaterally. PT Short Term Goal 1 - Progress: Progressing toward goal PT Short Term Goal 2: Patient will demosntrate increased hamstring trength to 4/5 MMT to increasek nee stability. PT Short Term Goal 2 - Progress: Progressing toward goal PT Short Term Goal 3: Patient will demosntrate increased quadraceps strength to 4+/5 MMT to icnrease ease with sit to stand. PT Short Term Goal 3 - Progress: Progressing toward goal PT Short Term Goal 4: Patient will demonstrate increased hip internal rotation to 35 degrees bilaterally PT Short Term Goal 5: Patient will be able to walk 30 minutes withtou and assistive device and no pain/   Problem List Patient Active Problem List   Diagnosis Date Noted  . Knee pain 03/27/2014  . Muscle weakness (generalized) 03/27/2014  . Meniscal injury 03/27/2014  . SKIN RASH 04/13/2009  . FATIGUE 08/29/2007  . DEPENDENT EDEMA, LEGS 03/27/2007  . SYMPTOM, SWELLING, ABDOMINAL, GENERALIZED 03/27/2007  . HYPERLIPIDEMIA 03/14/2007  . ANXIETY 03/14/2007  . GLAUCOMA NOS 03/14/2007  . HYPERTENSION 03/14/2007  . GERD 03/14/2007  . NEPHROLITHIASIS, HX OF 03/14/2007    PT - End of Session Equipment Utilized During Treatment: Gait belt Activity Tolerance: Patient tolerated treatment well General Behavior During Therapy: [facility] for tasks assessed/performed  GP    Aldona Lento 04/14/2014, 3:12 PM

## 2014-04-16 ENCOUNTER — Ambulatory Visit (HOSPITAL_COMMUNITY)
Admission: RE | Admit: 2014-04-16 | Discharge: 2014-04-16 | Disposition: A | Discharge status: Home / Self Care | Payer: Medicare Other | Source: Ambulatory Visit | Attending: Orthopedic Surgery | Admitting: Orthopedic Surgery

## 2014-04-16 DIAGNOSIS — IMO0001 Reserved for inherently not codable concepts without codable children: Secondary | ICD-10-CM | POA: Diagnosis not present

## 2014-04-16 NOTE — Progress Notes (Signed)
Physical Therapy Treatment Patient Details  Name: Hannah Reynolds MRN: [id number] Date of Birth: 12-18-1945  Today's Date: 04/16/2014 Time: 1300-1346 PT Time Calculation (min): 22 min Charge: TE [id number]  Visit#: 6 of 12  Re-eval: 04/26/14 Assessment Diagnosis: Rt knee pain secondary to knee stiffness Next MD Visit: Alvan Dame in October Prior Therapy: no  Authorization: Medicare  Authorization Time Period:    Authorization Visit#:   of     Subjective: Symptoms/Limitations Symptoms: Pt stated she feels like a bone is growing in her knee joint, current pain scale 1-2/10 unless rotation which increases to 10/10. Pain Assessment Currently in Pain?: Yes Pain Score: 2  Pain Location: Knee Pain Orientation: Right;Anterior  Objective:   Exercise/Treatments Stretches Active Hamstring Stretch: Limitations;3 reps;30 seconds Active Hamstring Stretch Limitations: standing 3 directions on 14 in step  Hip Flexor Stretch: Limitations Hip Flexor Stretch Limitations: 3x 20" groin and hip stretch on 14in box Gastroc Stretch: 3 reps;30 seconds;Limitations Gastroc Stretch Limitations: slant board Standing Heel Raises: 10 reps;Limitations Heel Raises Limitations: Toe raises Knee Flexion: 15 reps Forward Lunges: Both;10 reps;Limitations Forward Lunges Limitations: 8in step Functional Squat: 10 reps;Limitations Functional Squat Limitations: 3D hip excursion 10x,  Gait Training: No AD working on heel to toe and equalize stride length    Physical Therapy Assessment and Plan PT Assessment and Plan Clinical Impression Statement: Evaluated PT assessed knee following c/o of suspected bone growth, PT reported no findings.  Held lateral steps per increased pain with lateral deviations of knee.  No reports of increased pain with weight shifting to Rt knee.  Resumed stretches to hip to improve hip and knee stability.  Progressed  hamstring  strengthening  to provide increased stabiliity with transverse  plane.  Pt demonstrated lack of confidence and stability outside of brace due to fear of pain.  No reports of increased pain through session.  PT Plan: Continue with current POC, decrease step height with forward lunges for increased knee loading.  progress hip internal rotation to improve gait mechanics. Introduce big reach walks and continue 2D walking hip excursions next session.  Progress hamstring strengthening tiwards goals.  Progress to stair training when appropiaite.    Goals PT Short Term Goals PT Short Term Goal 1: Patient will demosntrate increased ankle dorsiflexion to >15 degrees bilaterally. PT Short Term Goal 2: Patient will demosntrate increased hamstring trength to 4/5 MMT to increasek nee stability. PT Short Term Goal 2 - Progress: Progressing toward goal PT Short Term Goal 3: Patient will demosntrate increased quadraceps strength to 4+/5 MMT to icnrease ease with sit to stand. PT Short Term Goal 3 - Progress: Progressing toward goal PT Short Term Goal 4: Patient will demonstrate increased hip internal rotation to 35 degrees bilaterally PT Short Term Goal 5: Patient will be able to walk 30 minutes withtou and assistive device and no pain/   Problem List Patient Active Problem List   Diagnosis Date Noted  . Knee pain 03/27/2014  . Muscle weakness (generalized) 03/27/2014  . Meniscal injury 03/27/2014  . SKIN RASH 04/13/2009  . FATIGUE 08/29/2007  . DEPENDENT EDEMA, LEGS 03/27/2007  . SYMPTOM, SWELLING, ABDOMINAL, GENERALIZED 03/27/2007  . HYPERLIPIDEMIA 03/14/2007  . ANXIETY 03/14/2007  . GLAUCOMA NOS 03/14/2007  . HYPERTENSION 03/14/2007  . GERD 03/14/2007  . NEPHROLITHIASIS, HX OF 03/14/2007    PT - End of Session Activity Tolerance: Patient tolerated treatment well General Behavior During Therapy: [facility] for tasks assessed/performed  GP    Aldona Lento 04/16/2014, 1:58 PM

## 2014-04-21 ENCOUNTER — Ambulatory Visit (HOSPITAL_COMMUNITY)
Admission: RE | Admit: 2014-04-21 | Discharge: 2014-04-21 | Disposition: A | Discharge status: Home / Self Care | Payer: Medicare Other | Source: Ambulatory Visit | Attending: Orthopedic Surgery | Admitting: Orthopedic Surgery

## 2014-04-21 DIAGNOSIS — M25569 Pain in unspecified knee: Secondary | ICD-10-CM | POA: Diagnosis not present

## 2014-04-21 DIAGNOSIS — IMO0001 Reserved for inherently not codable concepts without codable children: Secondary | ICD-10-CM | POA: Diagnosis not present

## 2014-04-21 NOTE — Progress Notes (Signed)
Physical Therapy Treatment Patient Details  Name: Hannah Reynolds MRN: [id number] Date of Birth: [birth year]  Today's Date: 04/21/2014 Time: 1349-1430 PT Time Calculation (min): 41 min    Charges: TE 1349-1400, Gait training [id number] Visit#: 7 of 12  Re-eval: 04/26/14 Assessment Diagnosis: Rt knee pain secondary to knee stiffness Next MD Visit: Alvan Dame in October Prior Therapy: no  Authorization: Medicare   Subjective: Symptoms/Limitations Symptoms: Patient styates she has been feeling better and only uses her walker when ambulatign in the grass. Patient notes no knee pain when demosntrating gait and states it only ever hurts if she twists. Patient notes increase glut soreness since increasing walking.  Pain Assessment Currently in Pain?: No/denies  Exercise/Treatments Stretches ITB Stretch: Limitations ITB Stretch Limitations: 10x 3 seconds Piriformis Stretch: 4 reps;20 seconds Gastroc Stretch: 3 reps;30 seconds;Limitations Gastroc Stretch Limitations: slant board Standing Forward Lunges: Both;10 reps Forward Lunges Limitations: 6" step Side Lunges: 5 reps;Limitations;Both Side Lunges Limitations: 6" step Functional Squat Limitations: squat walk around 10x Other Standing Knee Exercises: 2D walking hip excursion  Physical Therapy Assessment and Plan PT Assessment and Plan Clinical Impression Statement: Patient displays good progress towards goals. with improved gait and ability to weight bear. Focyus of therapy top shift towards strengtheing as mobility is much improved, except for Rt hip internal rotation. Follwoign introduction of lunges patient displasyed improved stride length and balance during gait with decreaseed trendelenberg.  PT Plan: Continue focus on strengthenign as patient is doing stretches independently at home, decrease step height with forward lunges for increased knee loading.  introduce big reach walks and continue 2D walking hip excursions next session.   Continue lunges to 6" box, add side lunges to 8" box.  Progress to stair training as toleratedappropiaite.    Goals PT Short Term Goals PT Short Term Goal 1: Patient will demosntrate increased ankle dorsiflexion to >15 degrees bilaterally. PT Short Term Goal 1 - Progress: Progressing toward goal PT Short Term Goal 2: Patient will demosntrate increased hamstring trength to 4/5 MMT to increasek nee stability. PT Short Term Goal 2 - Progress: Progressing toward goal PT Short Term Goal 3: Patient will demosntrate increased quadraceps strength to 4+/5 MMT to icnrease ease with sit to stand. PT Short Term Goal 3 - Progress: Progressing toward goal PT Short Term Goal 4: Patient will demonstrate increased hip internal rotation to 35 degrees bilaterally PT Short Term Goal 4 - Progress: Progressing toward goal PT Short Term Goal 5: Patient will be able to walk 30 minutes withtou and assistive device and no pain/  PT Short Term Goal 5 - Progress: Progressing toward goal  Problem List Patient Active Problem List   Diagnosis Date Noted  . Knee pain 03/27/2014  . Muscle weakness (generalized) 03/27/2014  . Meniscal injury 03/27/2014  . SKIN RASH 04/13/2009  . FATIGUE 08/29/2007  . DEPENDENT EDEMA, LEGS 03/27/2007  . SYMPTOM, SWELLING, ABDOMINAL, GENERALIZED 03/27/2007  . HYPERLIPIDEMIA 03/14/2007  . ANXIETY 03/14/2007  . GLAUCOMA NOS 03/14/2007  . HYPERTENSION 03/14/2007  . GERD 03/14/2007  . NEPHROLITHIASIS, HX OF 03/14/2007    PT - End of Session Activity Tolerance: Patient tolerated treatment well General Behavior During Therapy: [facility] for tasks assessed/performed  GP    Juliocesar Blasius R 04/21/2014, 2:41 PM

## 2014-04-23 ENCOUNTER — Ambulatory Visit (HOSPITAL_COMMUNITY)
Admission: RE | Admit: 2014-04-23 | Discharge: 2014-04-23 | Disposition: A | Discharge status: Home / Self Care | Payer: Medicare Other | Source: Ambulatory Visit | Attending: Orthopedic Surgery | Admitting: Orthopedic Surgery

## 2014-04-23 DIAGNOSIS — IMO0001 Reserved for inherently not codable concepts without codable children: Secondary | ICD-10-CM | POA: Diagnosis not present

## 2014-04-23 NOTE — Progress Notes (Addendum)
Physical Therapy Treatment Patient Details  Name: Hannah Reynolds MRN: [id number] Date of Birth: 1946-02-07  Today's Date: 04/23/2014 Time: 1300-1345 PT Time Calculation (min): 45 min    Charges: Gait training 1300-1320, TE [id number] Visit#: 8 of 12  Re-eval: 04/26/14 Assessment Diagnosis: Rt knee pain secondary to knee stiffness Next MD Visit: Alvan Dame in October Prior Therapy: no  Authorization: Medicare   Subjective: Symptoms/Limitations Symptoms: Patient states she may have over done it while in the pool yesterday as she lost track of time.  Pain Assessment Currently in Pain?: No/denies  Exercise/Treatments Stretches ITB Stretch: Limitations ITB Stretch Limitations: 10x 3 seconds Piriformis Stretch: 4 reps;20 seconds Gastroc Stretch: 3 reps;30 seconds;Limitations Gastroc Stretch Limitations: slant board Standing Forward Lunges: Both;10 reps Forward Lunges Limitations: 6" step Side Lunges: 5 reps;Limitations;Both Side Lunges Limitations: 8" step Forward Step Up: Right;Hand Hold: 2;Step Height: 4";5 reps Step Down: Step Height: 2";5 sets Functional Squat Limitations: squat walk around 10x Other Standing Knee Exercises: 2D walking hip excursion 59ft each, 2way Big reach walk 27ft each  Physical Therapy Assessment and Plan PT Assessment and Plan Clinical Impression Statement: Patient demsontrated good performance of exercises with no pain except with attempt to perform normal height steps for stair ambulation. Patient performed shorter steps with improved success and decreased pain with pain only 1x when ambulating down stairs. Patient displays improved gait  and balance displaying no difficulty with dynamic gait activities except stairs secondary to weakness.  PT Plan: Continue focus on strengthenign as patient is doing stretches independently at home, decrease step height with forward lunges for increased knee loading. DC big reach walks and continue 2D walking hip excursions  next session to HEP.  Continue lunges to 6" box, add side lunges to 4" box.  Progress to stair training as tolerated appropiaite. Introduce sumo walk next session for  glut strengtheing.     Goals PT Short Term Goals PT Short Term Goal 1: Patient will demosntrate increased ankle dorsiflexion to >15 degrees bilaterally. PT Short Term Goal 1 - Progress: Progressing toward goal PT Short Term Goal 2: Patient will demosntrate increased hamstring trength to 4/5 MMT to increasek nee stability. PT Short Term Goal 2 - Progress: Progressing toward goal PT Short Term Goal 3: Patient will demosntrate increased quadraceps strength to 4+/5 MMT to icnrease ease with sit to stand. PT Short Term Goal 3 - Progress: Progressing toward goal PT Short Term Goal 4: Patient will demonstrate increased hip internal rotation to 35 degrees bilaterally PT Short Term Goal 4 - Progress: Progressing toward goal  Problem List Patient Active Problem List   Diagnosis Date Noted  . Knee pain 03/27/2014  . Muscle weakness (generalized) 03/27/2014  . Meniscal injury 03/27/2014  . SKIN RASH 04/13/2009  . FATIGUE 08/29/2007  . DEPENDENT EDEMA, LEGS 03/27/2007  . SYMPTOM, SWELLING, ABDOMINAL, GENERALIZED 03/27/2007  . HYPERLIPIDEMIA 03/14/2007  . ANXIETY 03/14/2007  . GLAUCOMA NOS 03/14/2007  . HYPERTENSION 03/14/2007  . GERD 03/14/2007  . NEPHROLITHIASIS, HX OF 03/14/2007    PT - End of Session Activity Tolerance: Patient tolerated treatment well General Behavior During Therapy: [facility] for tasks assessed/performed  GP    Shine Scrogham R 04/23/2014, 2:04 PM

## 2014-04-28 ENCOUNTER — Ambulatory Visit (HOSPITAL_COMMUNITY)
Admission: RE | Admit: 2014-04-28 | Discharge: 2014-04-28 | Disposition: A | Discharge status: Home / Self Care | Payer: Medicare Other | Source: Ambulatory Visit | Attending: Orthopedic Surgery | Admitting: Orthopedic Surgery

## 2014-04-28 DIAGNOSIS — IMO0001 Reserved for inherently not codable concepts without codable children: Secondary | ICD-10-CM | POA: Diagnosis not present

## 2014-04-28 NOTE — Evaluation (Signed)
Physical Therapy Re-assessment  Patient Details  Name: Hannah Reynolds MRN: [id number] Date of Birth: 1946-05-21  Today's Date: 04/28/2014 Time: 2229-7989 PT Time Calculation (min): 42 min    Charges: TE [id number]          Visit#: 9 of 12  Re-eval: 05/28/14 Assessment Diagnosis: Rt knee pain secondary to knee stiffness Next MD Visit: Alvan Dame in October Prior Therapy: no  Authorization: Medicare    Authorization Time Period:    Authorization Visit#:   of     Past Medical History:  Past Medical History  Diagnosis Date  . GERD (gastroesophageal reflux disease)   . Hyperlipidemia   . Hypertension   . Unspecified glaucoma   . Anxiety   . CAD (coronary artery disease)   . Nephrolithiasis    Past Surgical History:  Past Surgical History  Procedure Laterality Date  . Hernia repair    . Hemicolection in 1999 during exploratory surgery     . Abdominal exploration surgery    . Ventral hernia repair    . Incisional hernia repair      Subjective Symptoms/Limitations Symptoms: Patient arrives with only her cain and no brace on noting more confidence and less pain; Notes some pain yesterday, but none today. Patient notesno longer using shower chair and being able to go grocery shopping with hands only on the shopping cart.  Pain Assessment Currently in Pain?: No/denies Pain Score: 0-No pain  Sensation/Coordination/Flexibility/Functional Tests Flexibility 90/90: Positive Functional Tests Functional Tests: Pat Patrick: minimally positive Functional Tests: Piriformis: positive on Rt Functional Tests: Positive McMurrays on Rt knee for medial menmiscus, positive joint line tenderness, Positive thessaly test  Assessment RLE AROM (degrees) Right Hip Extension: 0 Right Hip Flexion: 125 Right Knee Extension: 0 Right Knee Flexion: 140 RLE Strength Right Hip Flexion: 4/5 Right Hip Extension: 3+/5 Right Hip External Rotation : 45 Right Hip Internal Rotation : 25 Right Hip ABduction:  3+/5 Right Knee Flexion: 4/5 Right Knee Extension: 4/5 Right Ankle Dorsiflexion:  (4+/5)  Exercise/Treatments Stretches ITB Stretch: Limitations ITB Stretch Limitations: 10x 3 seconds Piriformis Stretch: 4 reps;20 seconds Gastroc Stretch: 3 reps;30 seconds;Limitations Gastroc Stretch Limitations: slant board Standing Functional Squat Limitations: squat walk around 10x Other Standing Knee Exercises: Sumo walk 15 ft each with green tband  Physical Therapy Assessment and Plan PT Assessment and Plan Clinical Impression Statement: Patient is making great progress towards all goals. Patient will benefit from continued phsyical therapy to meet long term goals of walking without a pain and to increase strenght to be bale to walk wittout an assistive device. Patient's gait continues to be limited by limited ankle and hip mobility and limited hip strength/stability. Recommending conitnueing phsyical therapy 4 more weeks for twice a week to reach all goals.  PT Plan: Conitnue phsyicaql therapy 2x a week for 4 more weeks t reach long term goals. Continue focus on strengthenign as patient is doing stretches independently at home, decrease step height with forward lunges for increased knee loading.   Continue lunges to 6" box, add side lunges to 4" box.  Progress to stair training as tolerated appropiaite. Continue sumo walk next session for  glut strengtheing..    Goals PT Short Term Goals Time to Complete Short Term Goals: 4 weeks PT Short Term Goal 1: Patient will demosntrate increased ankle dorsiflexion to >15 degrees bilaterally. PT Short Term Goal 1 - Progress: Progressing toward goal PT Short Term Goal 2: Patient will demosntrate increased hamstring trength to 4/5 MMT to increasek nee  stability. PT Short Term Goal 2 - Progress: Met PT Short Term Goal 3: Patient will demosntrate increased quadraceps strength to 4+/5 MMT to icnrease ease with sit to stand. PT Short Term Goal 3 - Progress: Met PT  Short Term Goal 4: Patient will demonstrate increased hip internal rotation to 35 degrees bilaterally PT Short Term Goal 4 - Progress: Progressing toward goal PT Short Term Goal 5: Patient will be able to walk 30 minutes withtou and assistive device and no pain. PT Short Term Goal 5 - Progress: Progressing toward goal  Problem List Patient Active Problem List   Diagnosis Date Noted  . Knee pain 03/27/2014  . Muscle weakness (generalized) 03/27/2014  . Meniscal injury 03/27/2014  . SKIN RASH 04/13/2009  . FATIGUE 08/29/2007  . DEPENDENT EDEMA, LEGS 03/27/2007  . SYMPTOM, SWELLING, ABDOMINAL, GENERALIZED 03/27/2007  . HYPERLIPIDEMIA 03/14/2007  . ANXIETY 03/14/2007  . GLAUCOMA NOS 03/14/2007  . HYPERTENSION 03/14/2007  . GERD 03/14/2007  . NEPHROLITHIASIS, HX OF 03/14/2007    PT - End of Session Activity Tolerance: Patient tolerated treatment well General Behavior During Therapy: [facility] for tasks assessed/performed  GP Functional Assessment Tool Used: FOTO currently 59% limited was 72% limited Functional Limitation: Mobility: Walking and moving around Mobility: Walking and Moving Around Current Status ([id number]): At least 40 percent but less than 60 percent impaired, limited or restricted Mobility: Walking and Moving Around Goal Status [phone]): At least 20 percent but less than 40 percent impaired, limited or restricted  Leia Alf 04/28/2014, 2:37 PM  Physician Documentation Your signature is required to indicate approval of the treatment plan as stated above.  Please sign and either send electronically or make a copy of this report for your files and return this physician signed original.   Please mark one 1.__approve of plan  2. ___approve of plan with the following conditions.   ______________________________                                                          _____________________ Physician Signature                                                                                                              Date

## 2014-04-30 ENCOUNTER — Ambulatory Visit (HOSPITAL_COMMUNITY)
Admission: RE | Admit: 2014-04-30 | Discharge: 2014-04-30 | Disposition: A | Discharge status: Home / Self Care | Payer: Medicare Other | Source: Ambulatory Visit | Attending: Orthopedic Surgery | Admitting: Orthopedic Surgery

## 2014-04-30 DIAGNOSIS — IMO0001 Reserved for inherently not codable concepts without codable children: Secondary | ICD-10-CM | POA: Diagnosis not present

## 2014-04-30 NOTE — Progress Notes (Signed)
Physical Therapy Treatment Patient Details  Name: Hannah Reynolds MRN: [id number] Date of Birth: 12/16/1945  Today's Date: 04/30/2014 Time: 7416-3845 PT Time Calculation (min): 42 min  Visit#: 10 of 20  Re-eval: 05/28/14 Authorization: Medicare (gcode updated on visit #9)  Authorization Visit#: 10 of 19  Charges:  therex 42  Subjective: Symptoms/Limitations Symptoms: Patient states she is getting better.  Her knee is still hurting with certain movments but currently without pain. Pain Assessment Currently in Pain?: No/denies   Exercise/Treatments Stretches ITB Stretch Limitations: 10x 3 seconds foot crossed over onto 8" step  Gastroc Stretch: 3 reps;30 seconds;Limitations Gastroc Stretch Limitations: slant board Standing Forward Lunges: Both;10 reps Forward Lunges Limitations: 6" step Side Lunges: 10 reps;Both Side Lunges Limitations: 6" step Other Standing Knee Exercises: Sumo walk 15 ft each with green tband     Physical Therapy Assessment and Plan PT Assessment and Plan Clinical Impression Statement: Pt able to complete all exercises; unable to decrease to 4" step for lunges due to increased pain with task; stayed with 6" step.  progressed step ups/downs and lateral step ups with 4" step with therapist assistance for proper form and knee mechanics.  Postural cues needed with all actvities due to being forward bent. PT Plan: Continue to progress twoards LTG.  Progress to 4" step as able with lunges.  Add hurdles with short holds to increase hip flexor and glut strength/stability.     Problem List Patient Active Problem List   Diagnosis Date Noted  . Knee pain 03/27/2014  . Muscle weakness (generalized) 03/27/2014  . Meniscal injury 03/27/2014  . SKIN RASH 04/13/2009  . FATIGUE 08/29/2007  . DEPENDENT EDEMA, LEGS 03/27/2007  . SYMPTOM, SWELLING, ABDOMINAL, GENERALIZED 03/27/2007  . HYPERLIPIDEMIA 03/14/2007  . ANXIETY 03/14/2007  . GLAUCOMA NOS 03/14/2007  .  HYPERTENSION 03/14/2007  . GERD 03/14/2007  . NEPHROLITHIASIS, HX OF 03/14/2007    PT - End of Session Activity Tolerance: Patient tolerated treatment well General Behavior During Therapy: [facility] for tasks assessed/performed  GP Functional Limitation: Mobility: Walking and moving around  Teena Irani, PTA/CLT 04/30/2014, 2:45 PM

## 2014-05-05 ENCOUNTER — Ambulatory Visit (HOSPITAL_COMMUNITY)
Admission: RE | Admit: 2014-05-05 | Discharge: 2014-05-05 | Disposition: A | Discharge status: Home / Self Care | Payer: Medicare Other | Source: Ambulatory Visit | Attending: Orthopedic Surgery | Admitting: Orthopedic Surgery

## 2014-05-05 DIAGNOSIS — IMO0001 Reserved for inherently not codable concepts without codable children: Secondary | ICD-10-CM | POA: Diagnosis not present

## 2014-05-05 NOTE — Progress Notes (Signed)
Physical Therapy Treatment Patient Details  Name: Hannah Reynolds MRN: [id number] Date of Birth: 07-03-1946  Today's Date: 05/05/2014 Time: 1350-1430 PT Time Calculation (min): 40 min   Charges: TE 1350-1430 Visit#: 11 of 20  Re-eval: 05/28/14 Assessment Diagnosis: Rt knee pain secondary to knee stiffness Next MD Visit: Alvan Dame in October Prior Therapy: no  Authorization: Medicare (gcode updated on visit #9)   Authorization Visit#: 11 of 19   Subjective: Symptoms/Limitations Symptoms: Patient states she was in shower and locked her knees and they stiffened up. No pain after getting out of shower Pain Assessment Currently in Pain?: No/denies Pain Score: 0-No pain  Exercise/Treatments Stretches Piriformis Stretch: 4 reps;20 seconds Gastroc Stretch: 3 reps;30 seconds;Limitations Gastroc Stretch Limitations: slant board Standing Forward Lunges: Both;10 reps Forward Lunges Limitations: 4" step Side Lunges: 10 reps;Both Side Lunges Limitations: 4" step Functional Squat Limitations: squat walk around 10x Other Standing Knee Exercises: 2 way flamengo: foward and lateral (to increase ankle inversion strength) 10x each Other Standing Knee Exercises: Sumo and reverse monster walk 15 ft each with green tband  Physical Therapy Assessment and Plan PT Assessment and Plan Clinical Impression Statement: Patient displays improving strength with increased depth of squatting and lunging. Patient noted no pain throughtou exercises thoguh she conitnues to require hand holsd assist with gait and strenghteing exercises due to decreased LE strength and coordination.  PT Plan: Continue progressing towards LTG.  Progress to 2" step as able with lunges.  Add hurdles with short holds to increase hip flexor and glut strength/stability.    Goals PT Short Term Goals PT Short Term Goal 1: Patient will demosntrate increased ankle dorsiflexion to >15 degrees bilaterally. PT Short Term Goal 1 - Progress:  Progressing toward goal PT Short Term Goal 4: Patient will demonstrate increased hip internal rotation to 35 degrees bilaterally PT Short Term Goal 4 - Progress: Progressing toward goal PT Short Term Goal 5: Patient will be able to walk 30 minutes without and assistive device and no pain. PT Short Term Goal 5 - Progress: Partly met  Problem List Patient Active Problem List   Diagnosis Date Noted  . Knee pain 03/27/2014  . Muscle weakness (generalized) 03/27/2014  . Meniscal injury 03/27/2014  . SKIN RASH 04/13/2009  . FATIGUE 08/29/2007  . DEPENDENT EDEMA, LEGS 03/27/2007  . SYMPTOM, SWELLING, ABDOMINAL, GENERALIZED 03/27/2007  . HYPERLIPIDEMIA 03/14/2007  . ANXIETY 03/14/2007  . GLAUCOMA NOS 03/14/2007  . HYPERTENSION 03/14/2007  . GERD 03/14/2007  . NEPHROLITHIASIS, HX OF 03/14/2007    PT - End of Session Activity Tolerance: Patient tolerated treatment well General Behavior During Therapy: [facility] for tasks assessed/performed  GP    Edith Groleau R 05/05/2014, 2:41 PM

## 2014-05-07 ENCOUNTER — Ambulatory Visit (HOSPITAL_COMMUNITY): Admission: RE | Admit: 2014-05-07 | Payer: Medicare Other | Source: Ambulatory Visit | Admitting: Physical Therapy

## 2014-05-12 ENCOUNTER — Ambulatory Visit (HOSPITAL_COMMUNITY)
Admission: RE | Admit: 2014-05-12 | Discharge: 2014-05-12 | Disposition: A | Discharge status: Home / Self Care | Payer: Medicare Other | Source: Ambulatory Visit | Attending: Orthopedic Surgery | Admitting: Orthopedic Surgery

## 2014-05-12 DIAGNOSIS — IMO0001 Reserved for inherently not codable concepts without codable children: Secondary | ICD-10-CM | POA: Diagnosis not present

## 2014-05-12 NOTE — Progress Notes (Signed)
Physical Therapy Treatment Patient Details  Name: Hannah Reynolds MRN: [id number] Date of Birth: January 17, 1946  Today's Date: 05/12/2014 Time: 6734-1937 PT Time Calculation (min): 65 min Visit#: 12 of 20  Re-eval: 05/28/14 Authorization: Medicare (gcode updated on visit #9)  Authorization Visit#: 12 of 19  Charges:  therex 48'  Subjective: Symptoms/Limitations Symptoms: Pt states she is getting stronger.  NO longer using AD or brace. Pain Assessment Currently in Pain?: No/denies   Exercise/Treatments stretches Gastroc Stretch: 3 reps;30 seconds;Limitations Gastroc Stretch Limitations: slant board Aerobic Stationary Bike: nustep at end 60' no charge level 4 hills #4 Standing Forward Lunges: Both;10 reps Forward Lunges Limitations: 4" step Side Lunges: 10 reps;Both Side Lunges Limitations: 4" step Functional Squat Limitations: squat walk around 10x Other Standing Knee Exercises: 2 way flamengo: foward and lateral (to increase ankle inversion strength) 10x each Other Standing Knee Exercises: hurdles 12"/6" 2RT forward, Sumo walk 15 ft each with green tband      Physical Therapy Assessment and Plan PT Assessment and Plan Clinical Impression Statement: Pt continues to require therapist facilitation to complete lunges in correct form.  Added hurdles in forward direction and unable to complete reciprocally without 1 HHA. Pt requires encouragement to deepen squat.   Pt requested to complete session on nustep. PT Plan: Continue progressing towards LTG.  Continue to increase dynamic balance actvities.      Problem List Patient Active Problem List   Diagnosis Date Noted  . Knee pain 03/27/2014  . Muscle weakness (generalized) 03/27/2014  . Meniscal injury 03/27/2014  . SKIN RASH 04/13/2009  . FATIGUE 08/29/2007  . DEPENDENT EDEMA, LEGS 03/27/2007  . SYMPTOM, SWELLING, ABDOMINAL, GENERALIZED 03/27/2007  . HYPERLIPIDEMIA 03/14/2007  . ANXIETY 03/14/2007  . GLAUCOMA NOS 03/14/2007   . HYPERTENSION 03/14/2007  . GERD 03/14/2007  . NEPHROLITHIASIS, HX OF 03/14/2007    PT - End of Session Activity Tolerance: Patient tolerated treatment well General Behavior During Therapy: WFL for tasks assessed/performed   Teena Irani, PTA/CLT 05/12/2014, 3:59 PM

## 2014-05-14 ENCOUNTER — Ambulatory Visit (HOSPITAL_COMMUNITY)
Admission: RE | Admit: 2014-05-14 | Discharge: 2014-05-14 | Disposition: A | Discharge status: Home / Self Care | Payer: Medicare Other | Source: Ambulatory Visit | Attending: Orthopedic Surgery | Admitting: Orthopedic Surgery

## 2014-05-14 ENCOUNTER — Other Ambulatory Visit: Payer: Self-pay | Admitting: Internal Medicine

## 2014-05-14 DIAGNOSIS — M25561 Pain in right knee: Secondary | ICD-10-CM | POA: Diagnosis not present

## 2014-05-14 DIAGNOSIS — Z5189 Encounter for other specified aftercare: Secondary | ICD-10-CM | POA: Diagnosis not present

## 2014-05-14 DIAGNOSIS — R262 Difficulty in walking, not elsewhere classified: Secondary | ICD-10-CM | POA: Insufficient documentation

## 2014-05-14 DIAGNOSIS — M25661 Stiffness of right knee, not elsewhere classified: Secondary | ICD-10-CM | POA: Insufficient documentation

## 2014-05-14 NOTE — Progress Notes (Signed)
Physical Therapy Treatment Patient Details  Name: Hannah Reynolds MRN: [id number] Date of Birth: 1945-12-25  Today's Date: 05/14/2014 Time: 7371-0626 PT Time Calculation (min): 56 min   Charges: TE 9485-4627, Gait training 1510-1525 Visit#: 13 of 20  Re-eval: 05/28/14 Assessment Diagnosis: Rt knee pain secondary to knee stiffness Next MD Visit: Alvan Dame in October Prior Therapy: no  Authorization: Medicare (gcode updated on visit #9)   Subjective: Symptoms/Limitations Symptoms: Pt states she is getting stronger.  NO longer using AD or brace. notes mild knee discomfort on bilateral sides of Rt knee Pain Assessment Currently in Pain?: No/denies Pain Score: 0-No pain  Exercise/Treatments Stretches Active Hamstring Stretch Limitations: standingsame side reach on 14 in step 10x 3"  Hip Flexor Stretch Limitations:  hip flexor stretch with same side on 14in box ITB Stretch Limitations: 10x 3 seconds foot crossed over onto 8" step  Piriformis Stretch: Limitations Piriformis Stretch Limitations: 10x 3" forward and same side rotation Gastroc Stretch: 3 reps;30 seconds;Limitations Gastroc Stretch Limitations: slant board Aerobic Stationary Bike: nustep at end 50' no charge level 4 hills #4 Standing Forward Lunges: Both;10 reps Forward Lunges Limitations: 2" step Side Lunges: 10 reps;Both Side Lunges Limitations: 2" step Forward Step Up: Step Height: 8";10 reps;Limitations Forward Step Up Limitations: with medial heel wedge to prevent arch collapse.  Functional Squat Limitations: squat walk around 10x, squat matrix 5x each  Other Standing Knee Exercises: 2 way flamengo: foward and lateral (to increase ankle inversion strength) 10x each Other Standing Knee Exercises: sumo and Reverse monster walk 15 ft each with green tband Gait training: 172f, focus on heel toe gait without assistive device  Physical Therapy Assessment and Plan PT Assessment and Plan Clinical Impression Statement:  Session conitnued focus on functional mobility and strengtheing exercises to improve gaot by increasing hip, knee and ankle knternal rotation ROM. Introduced squat matrix this session for hip and knee stabilization. Step ups performed on 8" box with patient experiencing pain followign 6th repetition, following introduction of  medial hel wedge patient noted decreased pain as excessive foot arch collaps and subsequent excessive knee valgus moment improved.  PT Plan: Continue progressing towards LTG.  Continue to increase dynamic balance actvities. Conitnue 8" step ups with medial heel wedge.     Goals PT Short Term Goals PT Short Term Goal 1: Patient will demosntrate increased ankle dorsiflexion to >15 degrees bilaterally. PT Short Term Goal 1 - Progress: Progressing toward goal PT Short Term Goal 4: Patient will demonstrate increased hip internal rotation to 35 degrees bilaterally PT Short Term Goal 4 - Progress: Progressing toward goal PT Short Term Goal 5: Patient will be able to walk 30 minutes without and assistive device and no pain. PT Short Term Goal 5 - Progress: Met  Problem List Patient Active Problem List   Diagnosis Date Noted  . Knee pain 03/27/2014  . Muscle weakness (generalized) 03/27/2014  . Meniscal injury 03/27/2014  . SKIN RASH 04/13/2009  . FATIGUE 08/29/2007  . DEPENDENT EDEMA, LEGS 03/27/2007  . SYMPTOM, SWELLING, ABDOMINAL, GENERALIZED 03/27/2007  . HYPERLIPIDEMIA 03/14/2007  . ANXIETY 03/14/2007  . GLAUCOMA NOS 03/14/2007  . HYPERTENSION 03/14/2007  . GERD 03/14/2007  . NEPHROLITHIASIS, HX OF 03/14/2007    PT - End of Session Activity Tolerance: Patient tolerated treatment well General [facility]  GP    Shigeo Baugh R 05/14/2014, 3:24 PM

## 2014-05-19 ENCOUNTER — Ambulatory Visit (HOSPITAL_COMMUNITY)
Admission: RE | Admit: 2014-05-19 | Discharge: 2014-05-19 | Disposition: A | Discharge status: Home / Self Care | Payer: Medicare Other | Source: Ambulatory Visit | Attending: Physical Therapy | Admitting: Physical Therapy

## 2014-05-19 DIAGNOSIS — M25561 Pain in right knee: Secondary | ICD-10-CM | POA: Diagnosis not present

## 2014-05-19 DIAGNOSIS — Z5189 Encounter for other specified aftercare: Secondary | ICD-10-CM | POA: Diagnosis not present

## 2014-05-19 DIAGNOSIS — R262 Difficulty in walking, not elsewhere classified: Secondary | ICD-10-CM | POA: Diagnosis not present

## 2014-05-19 DIAGNOSIS — M25661 Stiffness of right knee, not elsewhere classified: Secondary | ICD-10-CM | POA: Diagnosis not present

## 2014-05-19 NOTE — Progress Notes (Signed)
Physical Therapy Treatment Patient Details  Name: Hannah Reynolds MRN: [id number] Date of Birth: 03/23/1946  Today's Date: 05/19/2014 Time: 4627-0350 PT Time Calculation (min): 48 min Charge TE [id number]  Visit#: 14 of 20  Re-eval: 05/28/14 Assessment Diagnosis: Rt knee pain secondary to knee stiffness Next MD Visit: Alvan Dame October  Prior Therapy: no  Authorization: Medicare (gcode updated on visit #9)  Authorization Time Period:    Authorization Visit#: 14 of 19   Subjective: Symptoms/Limitations Symptoms: Knee is feeling good today, stated she went walking into [employer] today.   How long can you walk comfortably?: Able to walk 20 minutes without AD Pain Assessment Currently in Pain?: No/denies  Objective:   Exercise/Treatments Stretches Active Hamstring Stretch Limitations: standingsame side reach on 14 in step 10x 3"  Gastroc Stretch: 3 reps;30 seconds;Limitations Gastroc Stretch Limitations: slant board Aerobic Stationary Bike: nustep taken, resume next session Standing Forward Lunges: Both;15 reps;Limitations Forward Lunges Limitations: 2" step Side Lunges: Both;15 reps;Limitations Side Lunges Limitations: 2" step Lateral Step Up: Right;15 reps;Hand Hold: 1;Step Height: 6";Other (comment) (with medial heel wedge to prevent arch collapse) Forward Step Up: Right;15 reps;Hand Hold: 1;Step Height: 8";Step Height: 6" Forward Step Up Limitations: with medial heel wedge to prevent arch collapse.  Functional Squat Limitations: squat matrix 5x each (7 directions) Stairs: 5x 7in reciprocal ascending; descending with Lt LE first for eccentric control Other Standing Knee Exercises: 2 way flamengo: foward and lateral (to increase ankle inversion strength) 10x each Other Standing Knee Exercises: hurdles 12" 2RT sidestepping with 10" holds    Physical Therapy Assessment and Plan PT Assessment and Plan Clinical Impression Statement: Stair training and lunges complete wtih  medial arch support to reduce arch collapsing, pt able to complete all exercises with no report of pain with activity.  Stair training complete with reciprocal pattern ascending and descending with Lt LE first to improve eccentric control with 1 HR, noted visible muscle fatigue with activity.  Pt stated getting in and out of car is difficutly, added hurdles sidestepping to improve balance and glut med strengthening to improve functional tasks.   PT Plan: Continue progressing towards LTG.  Continue to increase dynamic balance actvities. Conitnue 8" step ups with medial heel wedge.     Goals PT Short Term Goals PT Short Term Goal 1: Patient will demosntrate increased ankle dorsiflexion to >15 degrees bilaterally. PT Short Term Goal 2: Patient will demosntrate increased hamstring trength to 4/5 MMT to increasek nee stability. PT Short Term Goal 3: Patient will demosntrate increased quadraceps strength to 4+/5 MMT to icnrease ease with sit to stand. PT Short Term Goal 4: Patient will demonstrate increased hip internal rotation to 35 degrees bilaterally PT Short Term Goal 4 - Progress: Progressing toward goal PT Short Term Goal 5: Patient will be able to walk 30 minutes without and assistive device and no pain. PT Short Term Goal 5 - Progress: Progressing toward goal  Problem List Patient Active Problem List   Diagnosis Date Noted  . Knee pain 03/27/2014  . Muscle weakness (generalized) 03/27/2014  . Meniscal injury 03/27/2014  . SKIN RASH 04/13/2009  . FATIGUE 08/29/2007  . DEPENDENT EDEMA, LEGS 03/27/2007  . SYMPTOM, SWELLING, ABDOMINAL, GENERALIZED 03/27/2007  . HYPERLIPIDEMIA 03/14/2007  . ANXIETY 03/14/2007  . GLAUCOMA NOS 03/14/2007  . HYPERTENSION 03/14/2007  . GERD 03/14/2007  . NEPHROLITHIASIS, HX OF 03/14/2007    PT - End of Session Activity Tolerance: Patient tolerated treatment well General Behavior During Therapy: [facility] for tasks assessed/performed  GP    Aldona Lento 05/19/2014, 2:55 PM

## 2014-05-21 ENCOUNTER — Ambulatory Visit (HOSPITAL_COMMUNITY): Payer: Medicare Other

## 2014-05-21 ENCOUNTER — Telehealth (HOSPITAL_COMMUNITY): Payer: Self-pay

## 2014-05-21 NOTE — Telephone Encounter (Signed)
cx - going with husband to his dr appt today

## 2014-05-26 ENCOUNTER — Ambulatory Visit (HOSPITAL_COMMUNITY)
Admission: RE | Admit: 2014-05-26 | Discharge: 2014-05-26 | Disposition: A | Discharge status: Home / Self Care | Payer: Medicare Other | Source: Ambulatory Visit | Attending: Orthopedic Surgery | Admitting: Orthopedic Surgery

## 2014-05-26 DIAGNOSIS — M25661 Stiffness of right knee, not elsewhere classified: Secondary | ICD-10-CM | POA: Diagnosis not present

## 2014-05-26 DIAGNOSIS — M25561 Pain in right knee: Secondary | ICD-10-CM | POA: Diagnosis not present

## 2014-05-26 DIAGNOSIS — Z5189 Encounter for other specified aftercare: Secondary | ICD-10-CM | POA: Diagnosis not present

## 2014-05-26 DIAGNOSIS — R262 Difficulty in walking, not elsewhere classified: Secondary | ICD-10-CM | POA: Diagnosis not present

## 2014-05-26 NOTE — Evaluation (Signed)
Physical Therapy Reassessment  Patient Details  Name: Hannah Reynolds MRN: [id number] Date of Birth: 12/19/1945  Today's Date: 05/26/2014 Time: 1355-1430 PT Time Calculation (min): 35 min     Charges: TE 1355-1425, 1 MMT, 1 ROMM         Visit#: 15 of 23  Re-eval: 06/25/14 Assessment Diagnosis: Rt knee pain secondary to knee stiffness Next MD Visit: Alvan Dame October  Prior Therapy: no  Authorization: Medicare (gcode updated on visit #15)    Authorization Visit#: 44 of 23   Past Medical History:  Past Medical History  Diagnosis Date  . GERD (gastroesophageal reflux disease)   . Hyperlipidemia   . Hypertension   . Unspecified glaucoma   . Anxiety   . CAD (coronary artery disease)   . Nephrolithiasis    Past Surgical History:  Past Surgical History  Procedure Laterality Date  . Hernia repair    . Hemicolection in 1999 during exploratory surgery     . Abdominal exploration surgery    . Ventral hernia repair    . Incisional hernia repair      Subjective  patient notes improved pain, only with twisting and excessive stair ambulation such as this past weekend when she went up 4 flights of stairs  Sensation/Coordination/Flexibility/Functional Tests Functional Tests Functional Tests: Pat Patrick: negative positive Functional Tests: Piriformis: negative now Functional Tests: Positive McMurrays on Rt knee for lateral menmiscus for pain, no tenderness or popping Positive thessaly test  Assessment RLE AROM (degrees) Right Hip Flexion: 125 Right Knee Extension: 0 Right Knee Flexion: 140 Right Ankle Dorsiflexion: 15 RLE Strength Right Hip Flexion: 4/5 Right Hip Extension:  (4-/5) Right Hip External Rotation : 45 Right Hip Internal Rotation : 35 Right Hip ABduction: 3+/5 Right Knee Flexion: 4/5 Right Knee Extension: 4/5 Right Ankle Dorsiflexion:  (4+/5) Palpation Palpation: tenderness in popliteal fossa and hamstrings  Exercise/Treatments Stretches Active Hamstring Stretch  Limitations: standingsame side reach on 14 in step 10x 3"  Gastroc Stretch: 3 reps;30 seconds;Limitations Gastroc Stretch Limitations: slant board Aerobic Stationary Bike: nustep taken, resume next session Standing Forward Step Up: Step Height: 8";10 reps;Both Forward Step Up Limitations: also 12" with   Other Standing Knee Exercises: 2 way flamengo: foward and lateral (to increase ankle inversion strength) 10x each  Physical Therapy Assessment and Plan PT Assessment and Plan Clinical Impression Statement: Reassessment completed with patient makign great strides towards all goals. Therapy to conitnue focusing on patient's LE strength and activity tolerance training so patient can walk greater than 23mnutes without an A/D. Patient's gait conitnues to displays excessive pronation, limited ankle inversion and limitd tibial internal rotation resulting in excessive strain on knee during gait.  PT Plan: Continue to increase dynamic balance actvities and LE strengtheing and endurance training. Conitnue PT 2x a week for 4 more weeks to reach walking goals.     Goals PT Short Term Goals PT Short Term Goal 1: Patient will demosntrate increased ankle dorsiflexion to >15 degrees bilaterally. PT Short Term Goal 1 - Progress: Met PT Short Term Goal 2: Patient will demosntrate increased hamstring trength to 4/5 MMT to increasek nee stability. PT Short Term Goal 2 - Progress: Met PT Short Term Goal 3: Patient will demosntrate increased quadraceps strength to 4+/5 MMT to icnrease ease with sit to stand. PT Short Term Goal 3 - Progress: Met PT Short Term Goal 4: Patient will demonstrate increased hip internal rotation to 35 degrees bilaterally PT Short Term Goal 4 - Progress: Met PT Short Term Goal  5: Patient will be able to walk 30 minutes without and assistive device and no pain. PT Short Term Goal 5 - Progress: Progressing toward goal  Problem List Patient Active Problem List   Diagnosis Date Noted   . Knee pain 03/27/2014  . Muscle weakness (generalized) 03/27/2014  . Meniscal injury 03/27/2014  . SKIN RASH 04/13/2009  . FATIGUE 08/29/2007  . DEPENDENT EDEMA, LEGS 03/27/2007  . SYMPTOM, SWELLING, ABDOMINAL, GENERALIZED 03/27/2007  . HYPERLIPIDEMIA 03/14/2007  . ANXIETY 03/14/2007  . GLAUCOMA NOS 03/14/2007  . HYPERTENSION 03/14/2007  . GERD 03/14/2007  . NEPHROLITHIASIS, HX OF 03/14/2007    PT - End of Session Activity Tolerance: Patient tolerated treatment well General Behavior During Therapy: WFL for tasks assessed/performed  GP Functional Assessment Tool Used: FOTO currently 49% limited was 72% limited Functional Limitation: Mobility: Walking and moving around Mobility: Walking and Moving Around Current Status ([id number]): At least 40 percent but less than 60 percent impaired, limited or restricted Mobility: Walking and Moving Around Goal Status [phone]): At least 20 percent but less than 40 percent impaired, limited or restricted  Leia Alf 05/26/2014, 2:51 PM  Physician Documentation Your signature is required to indicate approval of the treatment plan as stated above.  Please sign and either send electronically or make a copy of this report for your files and return this physician signed original.   Please mark one 1.__approve of plan  2. ___approve of plan with the following conditions.   ______________________________                                                          _____________________ Physician Signature                                                                                                             Date

## 2014-05-28 ENCOUNTER — Ambulatory Visit (HOSPITAL_COMMUNITY)
Admission: RE | Admit: 2014-05-28 | Discharge: 2014-05-28 | Disposition: A | Discharge status: Home / Self Care | Payer: Medicare Other | Source: Ambulatory Visit | Attending: Orthopedic Surgery | Admitting: Orthopedic Surgery

## 2014-05-28 DIAGNOSIS — Z5189 Encounter for other specified aftercare: Secondary | ICD-10-CM | POA: Diagnosis not present

## 2014-05-28 DIAGNOSIS — M25561 Pain in right knee: Secondary | ICD-10-CM | POA: Diagnosis not present

## 2014-05-28 DIAGNOSIS — R262 Difficulty in walking, not elsewhere classified: Secondary | ICD-10-CM | POA: Diagnosis not present

## 2014-05-28 DIAGNOSIS — M25661 Stiffness of right knee, not elsewhere classified: Secondary | ICD-10-CM | POA: Diagnosis not present

## 2014-05-28 NOTE — Progress Notes (Signed)
Physical Therapy Treatment Patient Details  Name: Hannah Reynolds MRN: [id number] Date of Birth: 1946-05-11  Today's Date: 05/28/2014 Time: 4098-1191 PT Time Calculation (min): 33 min Charge: TE [id number], Manual 1308-6578  Visit#: 16 of 25  Re-eval: 06/25/14    Authorization: Medicare (gcode updated on visit #15)  Authorization Time Period: the3rapy authorized through 06/26/14  Authorization Visit#: 16 of 23   Subjective: Symptoms/Limitations Symptoms: Pt stated new buttock pain and lateral aspect of Rt LE, pain scale 8/10.  Knee pain scale 5/10 today Pain Assessment Currently in Pain?: Yes Pain Score: 5  Pain Location: Knee Pain Orientation: Right;Anterior  Objective:    Exercise/Treatments Stretches Active Hamstring Stretch: 3 reps;30 seconds;Limitations Active Hamstring Stretch Limitations: 3 directions 14in step Hip Flexor Stretch: 3 reps;30 seconds Hip Flexor Stretch Limitations:  hip flexor stretch with same side on 14in box ITB Stretch Limitations: 3x 30" 6in box Piriformis Stretch: 3 reps;30 seconds;Limitations Piriformis Stretch Limitations: seated and supine 3x 30" and manual stretches Gastroc Stretch: 3 reps;30 seconds;Limitations Gastroc Stretch Limitations: slant board  Manual Therapy Manual Therapy: Other (comment) Other Manual Therapy: Piriformis manual stretches, STM with compression on piriformis, manual IR 3x 60" for pain relief  Physical Therapy Assessment and Plan PT Assessment and Plan Clinical Impression Statement: Pt limited by piriformis pain stating since last session.  Session focus on reducing pain with stretches and manual techniques to reduce pain.  Held strengthening exercises due to high pain scale today.  Gait training to improve gait mechanics to reduce knee and back pain.  Pt encouraged to apply ice at home for pain and edema control.   PT Plan: Continue to increase dynamic balance actvities and LE strengtheing and endurance training.     Goals PT Short Term Goals PT Short Term Goal 1: Patient will demosntrate increased ankle dorsiflexion to >15 degrees bilaterally. PT Short Term Goal 2: Patient will demosntrate increased hamstring trength to 4/5 MMT to increasek nee stability. PT Short Term Goal 3: Patient will demosntrate increased quadraceps strength to 4+/5 MMT to icnrease ease with sit to stand. PT Short Term Goal 4: Patient will demonstrate increased hip internal rotation to 35 degrees bilaterally PT Short Term Goal 5: Patient will be able to walk 30 minutes without and assistive device and no pain. PT Short Term Goal 5 - Progress: Progressing toward goal  Problem List Patient Active Problem List   Diagnosis Date Noted  . Knee pain 03/27/2014  . Muscle weakness (generalized) 03/27/2014  . Meniscal injury 03/27/2014  . SKIN RASH 04/13/2009  . FATIGUE 08/29/2007  . DEPENDENT EDEMA, LEGS 03/27/2007  . SYMPTOM, SWELLING, ABDOMINAL, GENERALIZED 03/27/2007  . HYPERLIPIDEMIA 03/14/2007  . ANXIETY 03/14/2007  . GLAUCOMA NOS 03/14/2007  . HYPERTENSION 03/14/2007  . GERD 03/14/2007  . NEPHROLITHIASIS, HX OF 03/14/2007    PT - End of Session Activity Tolerance: Patient limited by pain General Behavior During Therapy: [facility] for tasks assessed/performed  GP    Aldona Lento 05/28/2014, 2:48 PM

## 2014-06-02 ENCOUNTER — Ambulatory Visit (HOSPITAL_COMMUNITY)
Admission: RE | Admit: 2014-06-02 | Discharge: 2014-06-02 | Disposition: A | Discharge status: Home / Self Care | Payer: Medicare Other | Source: Ambulatory Visit | Attending: Orthopedic Surgery | Admitting: Orthopedic Surgery

## 2014-06-02 DIAGNOSIS — M25561 Pain in right knee: Secondary | ICD-10-CM | POA: Diagnosis not present

## 2014-06-02 DIAGNOSIS — M25661 Stiffness of right knee, not elsewhere classified: Secondary | ICD-10-CM | POA: Diagnosis not present

## 2014-06-02 DIAGNOSIS — Z5189 Encounter for other specified aftercare: Secondary | ICD-10-CM | POA: Diagnosis not present

## 2014-06-02 DIAGNOSIS — R262 Difficulty in walking, not elsewhere classified: Secondary | ICD-10-CM | POA: Diagnosis not present

## 2014-06-02 NOTE — Progress Notes (Signed)
Physical Therapy Treatment Patient Details  Name: Hannah Reynolds MRN: [id number] Date of Birth: September 26, 1945  Today's Date: 06/02/2014 Time: 1300-1345 PT Time Calculation (min): 45 min Charge: TE 1300-1315, Manual [id number], Ice massage [id number]  Visit#: 17 of 25  Re-eval: 06/25/14 Assessment Diagnosis: Rt knee pain secondary to knee stiffness Next MD Visit: Alvan Dame unscheduled Prior Therapy: no  Authorization: Medicare (gcode updated on visit #15)  Authorization Time Period: therapy authorized through 06/26/14  Authorization Visit#: 17 of 23   Subjective: Symptoms/Limitations Symptoms: Pt continues to be limited by piriformis and hamstring pain scale 8/10.  She is now ambulating with decreased stance phase on Rt Pain Assessment Currently in Pain?: Yes Pain Score: 8  Pain Location: Knee Pain Orientation: Right;Posterior  Objective:   Exercise/Treatments Stretches Active Hamstring Stretch: 3 reps;30 seconds;Limitations Active Hamstring Stretch Limitations: supine with rope ITB Stretch: Limitations ITB Stretch Limitations: 3x 30" supine with rope Piriformis Stretch: 3 reps;30 seconds;Limitations Piriformis Stretch Limitations: supine 4 way BIl Gastroc Stretch: 3 reps;30 seconds;Limitations Gastroc Stretch Limitations: slant board  Modalities Modalities: Cryotherapy Manual Therapy Manual Therapy: Myofascial release Myofascial Release: MFR to Bil hamstrings, ITB and piriformis f/b ice massage  Cryotherapy Number Minutes Cryotherapy: 8 Minutes Cryotherapy Location: Hip;Knee Type of Cryotherapy: Ice massage  Physical Therapy Assessment and Plan PT Assessment and Plan Clinical Impression Statement: Pt continued to be limited by pain in piriformis and hamstirngs.  DPT stated he believed she strained hamstrings.  Session focus on techniques to reduce pain wthi stretches and manual techniques to reduce pain;  Held strengthening exercises today.  Ended session wtih ice massage  to piriformis and instretion point of medial hamstrings.  Noted edema in quad and medial hamstrings.  Pt encouraged to apply ice with elevation for pain and edema control.   PT Plan: Focus on pain control next session is still high.  Complete retro massage to assist with edema and MFR to hamstrings.  IF pain is within contol continue to increase dynamic balance actvities and LE strengtheing and endurance training.    Goals PT Short Term Goals PT Short Term Goal 1: Patient will demosntrate increased ankle dorsiflexion to >15 degrees bilaterally. PT Short Term Goal 2: Patient will demosntrate increased hamstring trength to 4/5 MMT to increasek nee stability. PT Short Term Goal 3: Patient will demosntrate increased quadraceps strength to 4+/5 MMT to icnrease ease with sit to stand. PT Short Term Goal 5: Patient will be able to walk 30 minutes without and assistive device and no pain. PT Short Term Goal 5 - Progress: Progressing toward goal  Problem List Patient Active Problem List   Diagnosis Date Noted  . Knee pain 03/27/2014  . Muscle weakness (generalized) 03/27/2014  . Meniscal injury 03/27/2014  . SKIN RASH 04/13/2009  . FATIGUE 08/29/2007  . DEPENDENT EDEMA, LEGS 03/27/2007  . SYMPTOM, SWELLING, ABDOMINAL, GENERALIZED 03/27/2007  . HYPERLIPIDEMIA 03/14/2007  . ANXIETY 03/14/2007  . GLAUCOMA NOS 03/14/2007  . HYPERTENSION 03/14/2007  . GERD 03/14/2007  . NEPHROLITHIASIS, HX OF 03/14/2007    PT - End of Session Activity Tolerance: Patient limited by pain General Behavior During Therapy: [facility] for tasks assessed/performed  GP    Aldona Lento 06/02/2014, 4:45 PM

## 2014-06-04 ENCOUNTER — Ambulatory Visit (HOSPITAL_COMMUNITY): Payer: Medicare Other | Admitting: Physical Therapy

## 2014-06-05 ENCOUNTER — Ambulatory Visit (HOSPITAL_COMMUNITY)
Admission: RE | Admit: 2014-06-05 | Discharge: 2014-06-05 | Disposition: A | Discharge status: Home / Self Care | Payer: Medicare Other | Source: Ambulatory Visit | Attending: Orthopedic Surgery | Admitting: Orthopedic Surgery

## 2014-06-05 DIAGNOSIS — M25561 Pain in right knee: Secondary | ICD-10-CM | POA: Diagnosis not present

## 2014-06-05 DIAGNOSIS — M25661 Stiffness of right knee, not elsewhere classified: Secondary | ICD-10-CM | POA: Diagnosis not present

## 2014-06-05 DIAGNOSIS — Z5189 Encounter for other specified aftercare: Secondary | ICD-10-CM | POA: Diagnosis not present

## 2014-06-05 DIAGNOSIS — R262 Difficulty in walking, not elsewhere classified: Secondary | ICD-10-CM | POA: Diagnosis not present

## 2014-06-05 NOTE — Progress Notes (Addendum)
Physical Therapy Treatment Patient Details  Name: Hannah Reynolds MRN: [id number] Date of Birth: 1945/11/20  Today's Date: 06/05/2014 Time: 1941-7408 PT Time Calculation (min): 40 min    Charges: TE 1448-1856, Manual [id number] Visit#: 18 of 25  Re-eval: 06/25/14 Assessment Diagnosis: Rt knee pain secondary to knee stiffness Next MD Visit: Alvan Dame unscheduled Prior Therapy: no  Authorization: Medicare (gcode updated on visit #15)  Authorization Time Period:    Authorization Visit#: 18 of 23   Subjective: Symptoms/Limitations Symptoms: Pt continues to be limited by piriformis, ITBand, and hamstring pain scale 8/10.  She is now ambulating with decreased stance phase on Rt Pain Assessment Currently in Pain?: Yes Pain Score: 7  Pain Location: Knee Pain Orientation: Posterior;Lateral;Proximal;Distal Pain Type: Chronic pain  Exercise/Treatments Stretches Active Hamstring Stretch: 3 reps;30 seconds;Limitations Active Hamstring Stretch Limitations: supine with rope ITB Stretch: Limitations ITB Stretch Limitations: 3x 30" supine with rope Piriformis Stretch: 3 reps;30 seconds;Limitations Piriformis Stretch Limitations: supine 4 way BIl Gastroc Stretch: 3 reps;30 seconds;Limitations Gastroc Stretch Limitations: slant board Aerobic Stationary Bike: nustep lvl 1 44min Supine Bridges: 15 reps;AROM  Manual Therapy Myofascial Release: MFR to Bil hamstrings, ITB and piriformis  Other Manual Therapy: Piriformis manual stretches, STM with compression on piriformis, manual IR 3x 60" for pain relief. intolerant to strain counter technique. Muscle energy technique to improve hip alignment  Physical Therapy Assessment and Plan PT Assessment and Plan Clinical Impression Statement: Pt continued to be limited by pain in piriformis and hamstring predominantly though she notes pain in entire Rt LE  with pain to moderate palpation. Patient may have strained hamstrings but overall appears hyper  sensitive to touch on entire Rt LE. Session focus on techniques to reduce pain with stretches and manual techniques to reduce pain; Held strengthening exercises today. Patient's hip alignment assessed with noted excessive Rt anterior pelvic tilt compared to Left. Following correction patient noted minor improvement in pain, though pain was still severe.  PT Plan: Focus on pain control next session is still high.  Complete retro massage to assist with edema and MFR to hamstrings.  IF pain is within contol continue to increase dynamic balance actvities and LE strengtheing and endurance training.    Problem List Patient Active Problem List   Diagnosis Date Noted  . Knee pain 03/27/2014  . Muscle weakness (generalized) 03/27/2014  . Meniscal injury 03/27/2014  . SKIN RASH 04/13/2009  . FATIGUE 08/29/2007  . DEPENDENT EDEMA, LEGS 03/27/2007  . SYMPTOM, SWELLING, ABDOMINAL, GENERALIZED 03/27/2007  . HYPERLIPIDEMIA 03/14/2007  . ANXIETY 03/14/2007  . GLAUCOMA NOS 03/14/2007  . HYPERTENSION 03/14/2007  . GERD 03/14/2007  . NEPHROLITHIASIS, HX OF 03/14/2007    PT - End of Session Activity Tolerance: Patient limited by pain General Behavior During Therapy: [facility] for tasks assessed/performed  GP    Kalika Smay R 06/05/2014, 3:51 PM

## 2014-06-08 DIAGNOSIS — Z23 Encounter for immunization: Secondary | ICD-10-CM | POA: Diagnosis not present

## 2014-06-09 ENCOUNTER — Telehealth (HOSPITAL_COMMUNITY): Payer: Self-pay | Admitting: Physical Therapy

## 2014-06-09 NOTE — Telephone Encounter (Signed)
Cash put me on the 12" box I have been going down hill eversince. I have not met my deductible and I can not afford the treatment. NF

## 2014-06-10 ENCOUNTER — Ambulatory Visit (HOSPITAL_COMMUNITY)
Admission: RE | Admit: 2014-06-10 | Payer: Medicare Other | Source: Ambulatory Visit | Attending: Orthopedic Surgery | Admitting: Orthopedic Surgery

## 2014-06-12 ENCOUNTER — Ambulatory Visit (HOSPITAL_COMMUNITY): Payer: Medicare Other | Admitting: Physical Therapy

## 2014-06-16 ENCOUNTER — Ambulatory Visit (HOSPITAL_COMMUNITY): Payer: Medicare Other | Admitting: Physical Therapy

## 2014-06-18 ENCOUNTER — Ambulatory Visit (HOSPITAL_COMMUNITY): Payer: Medicare Other | Admitting: Physical Therapy

## 2014-06-23 ENCOUNTER — Encounter: Payer: Self-pay | Admitting: Family Medicine

## 2014-06-23 ENCOUNTER — Ambulatory Visit (INDEPENDENT_AMBULATORY_CARE_PROVIDER_SITE_OTHER): Payer: Medicare Other | Admitting: Family Medicine

## 2014-06-23 ENCOUNTER — Ambulatory Visit (HOSPITAL_COMMUNITY): Payer: Medicare Other | Admitting: Physical Therapy

## 2014-06-23 VITALS — BP 104/64 | Temp 98.2°F | Wt 196.0 lb

## 2014-06-23 DIAGNOSIS — I10 Essential (primary) hypertension: Secondary | ICD-10-CM

## 2014-06-23 DIAGNOSIS — F411 Generalized anxiety disorder: Secondary | ICD-10-CM | POA: Diagnosis not present

## 2014-06-23 DIAGNOSIS — G43909 Migraine, unspecified, not intractable, without status migrainosus: Secondary | ICD-10-CM | POA: Diagnosis not present

## 2014-06-23 DIAGNOSIS — E785 Hyperlipidemia, unspecified: Secondary | ICD-10-CM | POA: Diagnosis not present

## 2014-06-23 DIAGNOSIS — M1990 Unspecified osteoarthritis, unspecified site: Secondary | ICD-10-CM | POA: Insufficient documentation

## 2014-06-23 DIAGNOSIS — Z1231 Encounter for screening mammogram for malignant neoplasm of breast: Secondary | ICD-10-CM | POA: Diagnosis not present

## 2014-06-23 MED ORDER — BUTALBITAL-APAP-CAFFEINE 50-300-40 MG PO CAPS: 1.0000 | ORAL_CAPSULE | Freq: Two times a day (BID) | ORAL | Status: DC | PRN

## 2014-06-23 MED ORDER — ALPRAZOLAM 0.5 MG PO TABS: ORAL_TABLET | ORAL | Status: DC

## 2014-06-23 NOTE — Assessment & Plan Note (Signed)
Excellent control on simvastatin. Continue current medication.

## 2014-06-23 NOTE — Patient Instructions (Addendum)
Let's trial 1 xanax a day instead of twice a day. See me in a month or so to discuss starting antidepressant to help symptoms.   Health Maintenance Due  Topic Date Due  . MAMMOGRAM - they will call 02/19/1998  . ZOSTAVAX -already had 02/20/2008  . INFLUENZA VACCINE -already had 03/14/2014

## 2014-06-23 NOTE — Assessment & Plan Note (Signed)
Had a long discussion with patient about benefits/risks or xanax. This is not current medical community recs for any form of anxiety and we discussed starting SSRI. Patient would like to try to wean to once a day Xanax and then consider SSRI addition. i gave her a refill and asked her to attempt wean and see me within 1-2 months to start SSRI and for anxiety specific evaluation (determine if GAD/depression/bipolar/etc
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
## 2014-06-23 NOTE — Assessment & Plan Note (Signed)
Slightly atypical regimen but well controlled. Continue Imdur 30mg , lisinopril 20mg , triamterine-hctz 37.5-25, verapamil 180mg  CR. Discussed risks of hyperkalemia on triamterene and lisinopril so need to monitor.

## 2014-06-25 ENCOUNTER — Ambulatory Visit (HOSPITAL_COMMUNITY): Payer: Medicare Other | Admitting: Physical Therapy

## 2014-06-29 ENCOUNTER — Ambulatory Visit (HOSPITAL_COMMUNITY): Payer: Medicare Other | Admitting: Physical Therapy

## 2014-07-02 ENCOUNTER — Ambulatory Visit (HOSPITAL_COMMUNITY): Payer: Medicare Other | Admitting: Physical Therapy

## 2014-07-06 ENCOUNTER — Ambulatory Visit (HOSPITAL_COMMUNITY): Payer: Medicare Other | Admitting: Physical Therapy

## 2014-07-07 ENCOUNTER — Telehealth: Payer: Self-pay | Admitting: Family Medicine

## 2014-07-07 NOTE — Telephone Encounter (Signed)
[facility] - [place], [place] - [address] [phone] is requesting re-fill on simvastatin (ZOCOR) 20 MG tablet

## 2014-07-08 ENCOUNTER — Ambulatory Visit (HOSPITAL_COMMUNITY): Payer: Medicare Other | Admitting: Physical Therapy

## 2014-07-08 MED ORDER — SIMVASTATIN 20 MG PO TABS: 20.0000 mg | ORAL_TABLET | Freq: Every day | ORAL | Status: AC

## 2014-07-08 NOTE — Telephone Encounter (Signed)
Medication refilled

## 2014-07-14 ENCOUNTER — Encounter (HOSPITAL_COMMUNITY): Payer: Self-pay | Admitting: Physical Therapy

## 2014-07-14 NOTE — Therapy (Signed)
  Patient Details  Name: Hannah Reynolds MRN: [id number] Date of Birth: Nov 08, 1945  Encounter Date: 07/14/2014  PHYSICAL THERAPY DISCHARGE SUMMARY  Visits from Start of Care: 18  Current functional level related to goals / functional outcomes:  PT Goal 1: Patient will demosntrate increased ankle dorsiflexion to >15 degrees bilaterally. PT  Goal 1 - Progress: Met PT Goal 2: Patient will demosntrate increased hamstring trength to 4/5 MMT to increasek nee stability. PT   Goal 2 - Progress: Met PT Goal 3: Patient will demosntrate increased quadraceps strength to 4+/5 MMT to icnrease ease with sit to stand. PT  Goal 3 - Progress: Met PT Goal 4: Patient will demonstrate increased hip internal rotation to 35 degrees bilaterally PT Goal 4 - Progress: Met   Remaining deficits:  PT Goal 5: Patient will be able to walk 30 minutes without and assistive device and no pain. PT Goal 5 - Progress: Progressing toward goal   Education / Equipment: HEP given  Plan: Patient agrees to discharge.  Patient goals were not met. Patient is being discharged due to not returning since the last visit.  ?????        Devona Konig PT DPT [phone]

## 2014-07-21 ENCOUNTER — Telehealth: Payer: Self-pay

## 2014-07-21 ENCOUNTER — Other Ambulatory Visit: Payer: Self-pay | Admitting: Family Medicine

## 2014-07-21 NOTE — Telephone Encounter (Signed)
Rx request for Isosorbide 30 mg tab- TAke 1 tablet by mouth once daily  Pharm:  [facility]

## 2014-07-21 NOTE — Telephone Encounter (Signed)
Pt request refill of the following: isosorbide mononitrate (IMDUR) 30 MG 24 hr tablet   Phamacy: [facility]

## 2014-07-21 NOTE — Telephone Encounter (Signed)
Rx request for Butalbital/APAP/Caffeine- Take 1 tablet by mouth twice a day as needed for migraine headache #30.  Pharm:  [facility]

## 2014-07-22 MED ORDER — ISOSORBIDE MONONITRATE ER 30 MG PO TB24: 30.0000 mg | ORAL_TABLET | Freq: Every day | ORAL | Status: AC

## 2014-07-22 MED ORDER — ISOSORBIDE MONONITRATE ER 30 MG PO TB24: 30.0000 mg | ORAL_TABLET | Freq: Every day | ORAL | Status: DC

## 2014-07-22 MED ORDER — BUTALBITAL-APAP-CAFFEINE 50-300-40 MG PO CAPS: 1.0000 | ORAL_CAPSULE | Freq: Two times a day (BID) | ORAL | Status: DC | PRN

## 2014-07-22 NOTE — Telephone Encounter (Signed)
Rx sent to pharmacy   

## 2014-07-22 NOTE — Telephone Encounter (Signed)
Both medications refilled

## 2014-07-24 DIAGNOSIS — H4011X1 Primary open-angle glaucoma, mild stage: Secondary | ICD-10-CM | POA: Diagnosis not present

## 2014-07-24 DIAGNOSIS — H524 Presbyopia: Secondary | ICD-10-CM | POA: Diagnosis not present

## 2014-07-24 DIAGNOSIS — H5213 Myopia, bilateral: Secondary | ICD-10-CM | POA: Diagnosis not present

## 2014-07-24 DIAGNOSIS — H52223 Regular astigmatism, bilateral: Secondary | ICD-10-CM | POA: Diagnosis not present

## 2014-07-27 ENCOUNTER — Ambulatory Visit (INDEPENDENT_AMBULATORY_CARE_PROVIDER_SITE_OTHER): Payer: Medicare Other | Admitting: Family Medicine

## 2014-07-27 ENCOUNTER — Encounter: Payer: Self-pay | Admitting: Family Medicine

## 2014-07-27 VITALS — BP 100/74 | Temp 97.8°F | Wt 196.0 lb

## 2014-07-27 DIAGNOSIS — F411 Generalized anxiety disorder: Secondary | ICD-10-CM | POA: Diagnosis not present

## 2014-07-27 MED ORDER — ALPRAZOLAM 0.5 MG PO TABS: ORAL_TABLET | ORAL | Status: AC
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
## 2014-07-27 NOTE — Assessment & Plan Note (Addendum)
Poor control on xanax 0.5mg  BID with GAD7 of 13. Patient admits to never trying discussed plan of reducing xanax to once daily dosing. Discussed SSRI therapy and patient refuses stating she has tried several and feels like "a zombie". Refuses psychiatry referral. Interested in CBT and willing to look into options-gave her options per AVS. Advised regular exercise-patient going to try to increase walking in pool at [facility].   Discussed would wean off xanax within 6 months. Could consider options such as nortriptyline or Buspar as not clear she has tried these.

## 2014-07-27 NOTE — Patient Instructions (Addendum)
List of psychotherapy providers available through [email or web address] or [email or web address] or check through [employer] website for psychologists/behavioral health specialists that provide cognitive behavioral therapy.   We discussed that I do not prescribe xanax on a chronic basis for anxiety. You have had strong side effects from antidepressants of feeling like a "zombie" and do not want to take these. Even on xanax your symptoms are not well controlled. I would recommend therapy to see if we can control your symptoms better and reassess in 2 months. We will not continue xanax beyond 6 months so we need to work to get off in that time frame.

## 2014-09-08 ENCOUNTER — Other Ambulatory Visit: Payer: Self-pay | Admitting: *Deleted

## 2014-09-08 MED ORDER — TRIAMTERENE-HCTZ 37.5-25 MG PO TABS: ORAL_TABLET | ORAL | Status: DC

## 2014-09-21 DIAGNOSIS — Z6831 Body mass index (BMI) 31.0-31.9, adult: Secondary | ICD-10-CM | POA: Diagnosis not present

## 2014-09-21 DIAGNOSIS — F411 Generalized anxiety disorder: Secondary | ICD-10-CM | POA: Diagnosis not present

## 2014-09-21 DIAGNOSIS — I10 Essential (primary) hypertension: Secondary | ICD-10-CM | POA: Diagnosis not present

## 2014-09-21 DIAGNOSIS — E782 Mixed hyperlipidemia: Secondary | ICD-10-CM | POA: Diagnosis not present

## 2014-09-22 DIAGNOSIS — R7301 Impaired fasting glucose: Secondary | ICD-10-CM | POA: Diagnosis not present

## 2014-09-22 DIAGNOSIS — E785 Hyperlipidemia, unspecified: Secondary | ICD-10-CM | POA: Diagnosis not present

## 2014-09-22 DIAGNOSIS — F411 Generalized anxiety disorder: Secondary | ICD-10-CM | POA: Diagnosis not present

## 2014-09-22 DIAGNOSIS — I10 Essential (primary) hypertension: Secondary | ICD-10-CM | POA: Diagnosis not present

## 2014-09-30 ENCOUNTER — Other Ambulatory Visit: Payer: Self-pay

## 2014-09-30 MED ORDER — IBUPROFEN 800 MG PO TABS: ORAL_TABLET | ORAL | Status: DC

## 2014-09-30 NOTE — Telephone Encounter (Signed)
Rx request for Ibuprofen 800 mg tablet-Take 1 tablet by mouth 3 times daily #90  Pharm:  [facility].

## 2014-10-06 DIAGNOSIS — Z1211 Encounter for screening for malignant neoplasm of colon: Secondary | ICD-10-CM | POA: Diagnosis not present

## 2014-10-19 ENCOUNTER — Encounter (INDEPENDENT_AMBULATORY_CARE_PROVIDER_SITE_OTHER): Payer: Self-pay | Admitting: *Deleted

## 2014-10-28 DIAGNOSIS — S20469A Insect bite (nonvenomous) of unspecified back wall of thorax, initial encounter: Secondary | ICD-10-CM | POA: Diagnosis not present

## 2014-10-28 DIAGNOSIS — Z6831 Body mass index (BMI) 31.0-31.9, adult: Secondary | ICD-10-CM | POA: Diagnosis not present

## 2014-10-29 ENCOUNTER — Ambulatory Visit (INDEPENDENT_AMBULATORY_CARE_PROVIDER_SITE_OTHER): Payer: Medicare Other | Admitting: Internal Medicine

## 2014-11-16 ENCOUNTER — Other Ambulatory Visit (INDEPENDENT_AMBULATORY_CARE_PROVIDER_SITE_OTHER): Payer: Self-pay | Admitting: *Deleted

## 2014-11-16 ENCOUNTER — Telehealth (INDEPENDENT_AMBULATORY_CARE_PROVIDER_SITE_OTHER): Payer: Self-pay | Admitting: *Deleted

## 2014-11-16 ENCOUNTER — Ambulatory Visit (INDEPENDENT_AMBULATORY_CARE_PROVIDER_SITE_OTHER): Payer: Medicare Other | Admitting: Internal Medicine

## 2014-11-16 ENCOUNTER — Encounter (INDEPENDENT_AMBULATORY_CARE_PROVIDER_SITE_OTHER): Payer: Self-pay | Admitting: Internal Medicine

## 2014-11-16 VITALS — BP 130/70 | HR 72 | Temp 98.3°F | Ht 66.0 in | Wt 193.1 lb

## 2014-11-16 DIAGNOSIS — K625 Hemorrhage of anus and rectum: Secondary | ICD-10-CM

## 2014-11-16 DIAGNOSIS — R195 Other fecal abnormalities: Secondary | ICD-10-CM

## 2014-11-16 DIAGNOSIS — Z1211 Encounter for screening for malignant neoplasm of colon: Secondary | ICD-10-CM

## 2014-11-16 NOTE — Telephone Encounter (Signed)
Patient needs movi prep 

## 2014-11-16 NOTE — Progress Notes (Signed)
   Subjective:    Patient ID: Hannah Reynolds, female    DOB: 08/17/1945, 69 y.o.   MRN: [id number]  HPI Referred to our office by Dr. Wende Neighbors for positive stool card.  1 out of 3 was positive.  She denies seeing any blood. There has been no weight loss.  Appetite is good. No melena. No abdominal pain. No family hx of colon cancer. She has never had undergone a colonscopy in the past. She tells me she has had 4 hernia repairs with mesh.  Daily ASA and Naproxen.   Last weight  193 at Dr. Nevada Crane office 09/21/2014    Review of Systems Past Medical History  Diagnosis Date  . GERD (gastroesophageal reflux disease)   . Hyperlipidemia   . Hypertension   . Unspecified glaucoma   . Anxiety   . CAD (coronary artery disease)   . Nephrolithiasis     Past Surgical History  Procedure Laterality Date  . Hernia repair    . Hemicolection in 1999 during exploratory surgery     . Abdominal exploration surgery      ileus  . Ventral hernia repair    . Incisional hernia repair      No Known Allergies  Current Outpatient Prescriptions on File Prior to Visit  Medication Sig Dispense Refill  . ALPRAZolam (XANAX) 0.5 MG tablet TAKE (1) TABLET BY MOUTH TWICE DAILY AS NEEDED FOR ANXIETY. 60 tablet 1  . aspirin EC 81 MG tablet Take 81 mg by mouth daily.    . Butalbital-APAP-Caffeine 50-300-40 MG CAPS Take 1 capsule by mouth 2 (two) times daily as needed (migraine headache). 30 capsule 2  . ibuprofen (ADVIL,MOTRIN) 800 MG tablet TAKE (1) TABLET BY MOUTH (3) TIMES DAILY. (Patient taking differently: as needed. TAKE (1) TABLET BY MOUTH (3) TIMES DAILY.) 90 tablet 1  . isosorbide mononitrate (IMDUR) 30 MG 24 hr tablet Take 1 tablet (30 mg total) by mouth daily. 30 tablet 2  . lisinopril (PRINIVIL,ZESTRIL) 20 MG tablet TAKE ONE TABLET BY MOUTH ONCE DAILY. 30 tablet 11  . simvastatin (ZOCOR) 20 MG tablet Take 1 tablet (20 mg total) by mouth daily at 6 PM. 90 tablet 1  . timolol (BETIMOL) 0.5 % ophthalmic  solution Place 1 drop into both eyes 2 (two) times daily.     Marland Kitchen triamterene-hydrochlorothiazide (MAXZIDE-25) 37.5-25 MG per tablet TAKE ONE TABLET DAILY AS DIRECTED. 30 tablet 6  . verapamil (CALAN-SR) 180 MG CR tablet Take 180 mg by mouth at bedtime.     No current facility-administered medications on file prior to visit.          Married. Two children in good health.  Objective:   Physical Exam Filed Vitals:   11/16/14 0900  Height: 5\' 6"  (1.676 m)  Weight: 193 lb 1.6 oz (87.59 kg)   Alert and oriented. Skin warm and dry. Oral mucosa is moist.   . Sclera anicteric, conjunctivae is pink. Thyroid not enlarged. No cervical lymphadenopathy. Lungs clear. Heart regular rate and rhythm.  Abdomen is soft. Bowel sounds are positive. No hepatomegaly. No abdominal masses felt. No tenderness.  No edema to lower extremities. Stool brown and guaiac positive.    Developer: [id number], Exp 9-17 Card: Lot 02-14, Exp 07.18      Assessment & Plan:  Heme positive stool. Colonic neoplasm needs to be ruled out. AVM, polyp, ulcer in the differential.

## 2014-11-16 NOTE — Patient Instructions (Signed)
The risks and benefits such as perforation, bleeding, and infection were reviewed with the patient and is agreeable. 

## 2014-11-16 NOTE — Progress Notes (Addendum)
Subjective:    Patient ID: Hannah Reynolds, female    DOB: 1946/04/01, 69 y.o.   MRN: [id number]  HPI Referred to our office by Dr. Wende Neighbors for positive stool card.  1 out of 3 was positive.  She denies seeing any blood. There has been no weight loss.  Appetite is good. No melena. No abdominal pain. No family hx of colon cancer. She has never had undergone a colonscopy in the past. She tells me she has had 4 hernia repairs with mesh.  Daily ASA and Naproxen.   Last weight  193 at Dr. [place] Crane office 09/21/2014  09/23/2014 H and H 12.9 and 38.3, MCV 91. Platelet ct 310  Review of Systems Past Medical History  Diagnosis Date  . GERD (gastroesophageal reflux disease)   . Hyperlipidemia   . Hypertension   . Unspecified glaucoma   . Anxiety   . CAD (coronary artery disease)   . Nephrolithiasis     Past Surgical History  Procedure Laterality Date  . Hernia repair    . Hemicolection in 1999 during exploratory surgery     . Abdominal exploration surgery      ileus  . Ventral hernia repair    . Incisional hernia repair      No Known Allergies  Current Outpatient Prescriptions on File Prior to Visit  Medication Sig Dispense Refill  . ALPRAZolam (XANAX) 0.5 MG tablet TAKE (1) TABLET BY MOUTH TWICE DAILY AS NEEDED FOR ANXIETY. 60 tablet 1  . aspirin EC 81 MG tablet Take 81 mg by mouth daily.    . Butalbital-APAP-Caffeine 50-300-40 MG CAPS Take 1 capsule by mouth 2 (two) times daily as needed (migraine headache). 30 capsule 2  . ibuprofen (ADVIL,MOTRIN) 800 MG tablet TAKE (1) TABLET BY MOUTH (3) TIMES DAILY. (Patient taking differently: as needed. TAKE (1) TABLET BY MOUTH (3) TIMES DAILY.) 90 tablet 1  . isosorbide mononitrate (IMDUR) 30 MG 24 hr tablet Take 1 tablet (30 mg total) by mouth daily. 30 tablet 2  . lisinopril (PRINIVIL,ZESTRIL) 20 MG tablet TAKE ONE TABLET BY MOUTH ONCE DAILY. 30 tablet 11  . simvastatin (ZOCOR) 20 MG tablet Take 1 tablet (20 mg total) by mouth daily at 6  PM. 90 tablet 1  . timolol (BETIMOL) 0.5 % ophthalmic solution Place 1 drop into both eyes 2 (two) times daily.     Marland Kitchen triamterene-hydrochlorothiazide (MAXZIDE-25) 37.5-25 MG per tablet TAKE ONE TABLET DAILY AS DIRECTED. 30 tablet 6  . verapamil (CALAN-SR) 180 MG CR tablet Take 180 mg by mouth at bedtime.     No current facility-administered medications on file prior to visit.          Married. Two children in good health.  Objective:   Physical Exam Filed Vitals:   11/16/14 0900  Height: 5\' 6"  (1.676 m)  Weight: 193 lb 1.6 oz (87.59 kg)   Alert and oriented. Skin warm and dry. Oral mucosa is moist.   . Sclera anicteric, conjunctivae is pink. Thyroid not enlarged. No cervical lymphadenopathy. Lungs clear. Heart regular rate and rhythm.  Abdomen is soft. Bowel sounds are positive. No hepatomegaly. No abdominal masses felt. No tenderness.  No edema to lower extremities. Stool brown and guaiac positive.  Dr. Laural Golden in with patient to discuss need to have colonoscopy.   Developer: [id number], Exp 9-17 Card: Lot 02-14, Exp 07.18      Assessment & Plan:  Heme positive stool. Colonic neoplasm needs to be ruled out. AVM, polyp,  ulcer in the differential.

## 2014-11-17 MED ORDER — PEG-KCL-NACL-NASULF-NA ASC-C 100 G PO SOLR: 1.0000 | Freq: Once | ORAL | Status: DC

## 2014-11-19 ENCOUNTER — Encounter (HOSPITAL_COMMUNITY): Payer: Self-pay | Admitting: *Deleted

## 2014-11-20 ENCOUNTER — Ambulatory Visit (HOSPITAL_COMMUNITY)
Admission: RE | Admit: 2014-11-20 | Discharge: 2014-11-20 | Disposition: A | Discharge status: Home / Self Care | Payer: Medicare Other | Source: Ambulatory Visit | Attending: Internal Medicine | Admitting: Internal Medicine

## 2014-11-20 ENCOUNTER — Encounter (HOSPITAL_COMMUNITY): Payer: Self-pay | Admitting: *Deleted

## 2014-11-20 ENCOUNTER — Encounter (HOSPITAL_COMMUNITY)
Admission: RE | Disposition: A | Discharge status: Home / Self Care | Payer: Self-pay | Source: Ambulatory Visit | Attending: Internal Medicine

## 2014-11-20 DIAGNOSIS — K219 Gastro-esophageal reflux disease without esophagitis: Secondary | ICD-10-CM | POA: Insufficient documentation

## 2014-11-20 DIAGNOSIS — E785 Hyperlipidemia, unspecified: Secondary | ICD-10-CM | POA: Insufficient documentation

## 2014-11-20 DIAGNOSIS — K5730 Diverticulosis of large intestine without perforation or abscess without bleeding: Secondary | ICD-10-CM | POA: Insufficient documentation

## 2014-11-20 DIAGNOSIS — F419 Anxiety disorder, unspecified: Secondary | ICD-10-CM | POA: Diagnosis not present

## 2014-11-20 DIAGNOSIS — K648 Other hemorrhoids: Secondary | ICD-10-CM | POA: Diagnosis not present

## 2014-11-20 DIAGNOSIS — K644 Residual hemorrhoidal skin tags: Secondary | ICD-10-CM | POA: Diagnosis not present

## 2014-11-20 DIAGNOSIS — K625 Hemorrhage of anus and rectum: Secondary | ICD-10-CM

## 2014-11-20 DIAGNOSIS — Z7982 Long term (current) use of aspirin: Secondary | ICD-10-CM | POA: Insufficient documentation

## 2014-11-20 DIAGNOSIS — H409 Unspecified glaucoma: Secondary | ICD-10-CM | POA: Diagnosis not present

## 2014-11-20 DIAGNOSIS — I10 Essential (primary) hypertension: Secondary | ICD-10-CM | POA: Insufficient documentation

## 2014-11-20 DIAGNOSIS — R195 Other fecal abnormalities: Secondary | ICD-10-CM | POA: Diagnosis not present

## 2014-11-20 DIAGNOSIS — I2510 Atherosclerotic heart disease of native coronary artery without angina pectoris: Secondary | ICD-10-CM | POA: Diagnosis not present

## 2014-11-20 HISTORY — PX: COLONOSCOPY: SHX5424

## 2014-11-20 HISTORY — DX: Personal history of other diseases of the digestive system: Z87.19

## 2014-11-20 SURGERY — COLONOSCOPY
Anesthesia: Moderate Sedation

## 2014-11-20 MED ORDER — SODIUM CHLORIDE 0.9 % IJ SOLN
INTRAMUSCULAR | Status: AC
  Filled 2014-11-20: qty 3

## 2014-11-20 MED ORDER — MIDAZOLAM HCL 5 MG/5ML IJ SOLN
INTRAMUSCULAR | Status: AC
  Filled 2014-11-20: qty 10

## 2014-11-20 MED ORDER — PROMETHAZINE HCL 25 MG/ML IJ SOLN
INTRAMUSCULAR | Status: DC | PRN
  Administered 2014-11-20: 12.5 mg via INTRAVENOUS

## 2014-11-20 MED ORDER — MEPERIDINE HCL 50 MG/ML IJ SOLN
INTRAMUSCULAR | Status: AC
  Filled 2014-11-20: qty 1

## 2014-11-20 MED ORDER — MIDAZOLAM HCL 5 MG/5ML IJ SOLN
INTRAMUSCULAR | Status: DC | PRN
  Administered 2014-11-20: 2 mg via INTRAVENOUS
  Administered 2014-11-20: 3 mg via INTRAVENOUS
  Administered 2014-11-20 (×3): 2 mg via INTRAVENOUS
  Administered 2014-11-20: 3 mg via INTRAVENOUS
  Administered 2014-11-20: 1 mg via INTRAVENOUS

## 2014-11-20 MED ORDER — MEPERIDINE HCL 50 MG/ML IJ SOLN
INTRAMUSCULAR | Status: DC | PRN
  Administered 2014-11-20 (×2): 25 mg

## 2014-11-20 MED ORDER — MIDAZOLAM HCL 5 MG/5ML IJ SOLN
INTRAMUSCULAR | Status: AC
  Filled 2014-11-20: qty 5

## 2014-11-20 MED ORDER — SIMETHICONE 40 MG/0.6ML PO SUSP
ORAL | Status: DC | PRN
  Administered 2014-11-20: 11:00:00

## 2014-11-20 MED ORDER — PROMETHAZINE HCL 25 MG/ML IJ SOLN
INTRAMUSCULAR | Status: AC
  Filled 2014-11-20: qty 1

## 2014-11-20 MED ORDER — SODIUM CHLORIDE 0.9 % IV SOLN
INTRAVENOUS | Status: DC
  Administered 2014-11-20: 08:00:00 via INTRAVENOUS

## 2014-11-20 NOTE — Progress Notes (Signed)
Patient status post right hemicolectomy no cecum,  Withdrawal from anastamosis was

## 2014-11-20 NOTE — Discharge Instructions (Signed)
Discontinue Naprosyn while you're taking ibuprofen. Decrease ibuprofen dose to 400 mg up to 3 times a day as needed and always take it with food or snack. High fiber diet. No driving for 24 hours. Have Dr. Nevada Crane check hemoglobin in 3-4 months  Colonoscopy, Care After Refer to this sheet in the next few weeks. These instructions provide you with information on caring for yourself after your procedure. Your health care provider may also give you more specific instructions. Your treatment has been planned according to current medical practices, but problems sometimes occur. Call your health care provider if you have any problems or questions after your procedure. WHAT TO EXPECT AFTER THE PROCEDURE  After your procedure, it is typical to have the following:  A small amount of blood in your stool.  Moderate amounts of gas and mild abdominal cramping or bloating. HOME CARE INSTRUCTIONS  Do not drive, operate machinery, or sign important documents for 24 hours.  You may shower and resume your regular physical activities, but move at a slower pace for the first 24 hours.  Take frequent rest periods for the first 24 hours.  Walk around or put a warm pack on your abdomen to help reduce abdominal cramping and bloating.  Drink enough fluids to keep your urine clear or pale yellow.  You may resume your normal diet as instructed by your health care provider. Avoid heavy or fried foods that are hard to digest.  Avoid drinking alcohol for 24 hours or as instructed by your health care provider.  Only take over-the-counter or prescription medicines as directed by your health care provider.  If a tissue sample (biopsy) was taken during your procedure:  Do not take aspirin or blood thinners for 7 days, or as instructed by your health care provider.  Do not drink alcohol for 7 days, or as instructed by your health care provider.  Eat soft foods for the first 24 hours. SEEK MEDICAL CARE IF: You have  persistent spotting of blood in your stool 2-3 days after the procedure. SEEK IMMEDIATE MEDICAL CARE IF:  You have more than a small spotting of blood in your stool.  You pass large blood clots in your stool.  Your abdomen is swollen (distended).  You have nausea or vomiting.  You have a fever.  You have increasing abdominal pain that is not relieved with medicine. Diverticulosis Diverticulosis is the condition that develops when small pouches (diverticula) form in the wall of your colon. Your colon, or large intestine, is where water is absorbed and stool is formed. The pouches form when the inside layer of your colon pushes through weak spots in the outer layers of your colon. CAUSES  No one knows exactly what causes diverticulosis. RISK FACTORS  Being older than 81. Your risk for this condition increases with age. Diverticulosis is rare in people younger than 40 years. By age 97, almost everyone has it.  Eating a low-fiber diet.  Being frequently constipated.  Being overweight.  Not getting enough exercise.  Smoking.  Taking over-the-counter pain medicines, like aspirin and ibuprofen. SYMPTOMS  Most people with diverticulosis do not have symptoms. DIAGNOSIS  Because diverticulosis often has no symptoms, health care providers often discover the condition during an exam for other colon problems. In many cases, a health care provider will diagnose diverticulosis while using a flexible scope to examine the colon (colonoscopy). TREATMENT  If you have never developed an infection related to diverticulosis, you may not need treatment. If you have  had an infection before, treatment may include:  Eating more fruits, vegetables, and grains.  Taking a fiber supplement.  Taking a live bacteria supplement (probiotic).  Taking medicine to relax your colon. HOME CARE INSTRUCTIONS   Drink at least 6-8 glasses of water each day to prevent constipation.  Try not to strain when you  have a bowel movement.  Keep all follow-up appointments. If you have had an infection before:  Increase the fiber in your diet as directed by your health care provider or dietitian.  Take a dietary fiber supplement if your health care provider approves.  Only take medicines as directed by your health care provider. SEEK MEDICAL CARE IF:   You have abdominal pain.  You have bloating.  You have cramps.  You have not gone to the bathroom in 3 days. SEEK IMMEDIATE MEDICAL CARE IF:   Your pain gets worse.  Yourbloating becomes very bad.  You have a fever or chills, and your symptoms suddenly get worse.  You begin vomiting.  You have bowel movements that are bloody or black. MAKE SURE YOU:  Understand these instructions.  Will watch your condition.  Will get help right away if you are not doing well or get worse. Hemorrhoids Hemorrhoids are swollen veins around the rectum or anus. There are two types of hemorrhoids:   Internal hemorrhoids. These occur in the veins just inside the rectum. They may poke through to the outside and become irritated and painful.  External hemorrhoids. These occur in the veins outside the anus and can be felt as a painful swelling or hard lump near the anus. CAUSES  Pregnancy.   Obesity.   Constipation or diarrhea.   Straining to have a bowel movement.   Sitting for long periods on the toilet.  Heavy lifting or other activity that caused you to strain.  Anal intercourse. SYMPTOMS   Pain.   Anal itching or irritation.   Rectal bleeding.   Fecal leakage.   Anal swelling.   One or more lumps around the anus.  DIAGNOSIS  Your caregiver may be able to diagnose hemorrhoids by visual examination. Other examinations or tests that may be performed include:   Examination of the rectal area with a gloved hand (digital rectal exam).   Examination of anal canal using a small tube (scope).   A blood test if you have  lost a significant amount of blood.  A test to look inside the colon (sigmoidoscopy or colonoscopy). TREATMENT Most hemorrhoids can be treated at home. However, if symptoms do not seem to be getting better or if you have a lot of rectal bleeding, your caregiver may perform a procedure to help make the hemorrhoids get smaller or remove them completely. Possible treatments include:   Placing a rubber band at the base of the hemorrhoid to cut off the circulation (rubber band ligation).   Injecting a chemical to shrink the hemorrhoid (sclerotherapy).   Using a tool to burn the hemorrhoid (infrared light therapy).   Surgically removing the hemorrhoid (hemorrhoidectomy).   Stapling the hemorrhoid to block blood flow to the tissue (hemorrhoid stapling).  HOME CARE INSTRUCTIONS   Eat foods with fiber, such as whole grains, beans, nuts, fruits, and vegetables. Ask your doctor about taking products with added fiber in them (fibersupplements).  Increase fluid intake. Drink enough water and fluids to keep your urine clear or pale yellow.   Exercise regularly.   Go to the bathroom when you have the urge to have  a bowel movement. Do not wait.   Avoid straining to have bowel movements.   Keep the anal area dry and clean. Use wet toilet paper or moist towelettes after a bowel movement.   Medicated creams and suppositories may be used or applied as directed.   Only take over-the-counter or prescription medicines as directed by your caregiver.   Take warm sitz baths for 15-20 minutes, 3-4 times a day to ease pain and discomfort.   Place ice packs on the hemorrhoids if they are tender and swollen. Using ice packs between sitz baths may be helpful.   Put ice in a plastic bag.   Place a towel between your skin and the bag.   Leave the ice on for 15-20 minutes, 3-4 times a day.   Do not use a donut-shaped pillow or sit on the toilet for long periods. This increases blood  pooling and pain.  SEEK MEDICAL CARE IF:  You have increasing pain and swelling that is not controlled by treatment or medicine.  You have uncontrolled bleeding.  You have difficulty or you are unable to have a bowel movement.  You have pain or inflammation outside the area of the hemorrhoids. MAKE SURE YOU:  Understand these instructions.  Will watch your condition.  Will get help right away if you are not doing well or get worse. High-Fiber Diet Fiber is found in fruits, vegetables, and grains. A high-fiber diet encourages the addition of more whole grains, legumes, fruits, and vegetables in your diet. The recommended amount of fiber for adult males is 38 g per day. For adult females, it is 25 g per day. Pregnant and lactating women should get 28 g of fiber per day. If you have a digestive or bowel problem, ask your caregiver for advice before adding high-fiber foods to your diet. Eat a variety of high-fiber foods instead of only a select few type of foods.  PURPOSE  To increase stool bulk.  To make bowel movements more regular to prevent constipation.  To lower cholesterol.  To prevent overeating. WHEN IS THIS DIET USED?  It may be used if you have constipation and hemorrhoids.  It may be used if you have uncomplicated diverticulosis (intestine condition) and irritable bowel syndrome.  It may be used if you need help with weight management.  It may be used if you want to add it to your diet as a protective measure against atherosclerosis, diabetes, and cancer. SOURCES OF FIBER  Whole-grain breads and cereals.  Fruits, such as apples, oranges, bananas, berries, prunes, and pears.  Vegetables, such as green peas, carrots, sweet potatoes, beets, broccoli, cabbage, spinach, and artichokes.  Legumes, such split peas, soy, lentils.  Almonds. FIBER CONTENT IN FOODS Starches and Grains / Dietary Fiber (g)  Cheerios, 1 cup / 3 g  Corn Flakes cereal, 1 cup / 0.7  g  Rice crispy treat cereal, 1 cup / 0.3 g  Instant oatmeal (cooked),  cup / 2 g  Frosted wheat cereal, 1 cup / 5.1 g  Brown, long-grain rice (cooked), 1 cup / 3.5 g  White, long-grain rice (cooked), 1 cup / 0.6 g  Enriched macaroni (cooked), 1 cup / 2.5 g Legumes / Dietary Fiber (g)  Baked beans (canned, plain, or vegetarian),  cup / 5.2 g  Kidney beans (canned),  cup / 6.8 g  Pinto beans (cooked),  cup / 5.5 g Breads and Crackers / Dietary Fiber (g)  Plain or honey graham crackers, 2 squares /  0.7 g  Saltine crackers, 3 squares / 0.3 g  Plain, salted pretzels, 10 pieces / 1.8 g  Whole-wheat bread, 1 slice / 1.9 g  White bread, 1 slice / 0.7 g  Raisin bread, 1 slice / 1.2 g  Plain bagel, 3 oz / 2 g  Flour tortilla, 1 oz / 0.9 g  Corn tortilla, 1 small / 1.5 g  Hamburger or hotdog bun, 1 small / 0.9 g Fruits / Dietary Fiber (g)  Apple with skin, 1 medium / 4.4 g  Sweetened applesauce,  cup / 1.5 g  Banana,  medium / 1.5 g  Grapes, 10 grapes / 0.4 g  Orange, 1 small / 2.3 g  Raisin, 1.5 oz / 1.6 g  Melon, 1 cup / 1.4 g Vegetables / Dietary Fiber (g)  Green beans (canned),  cup / 1.3 g  Carrots (cooked),  cup / 2.3 g  Broccoli (cooked),  cup / 2.8 g  Peas (cooked),  cup / 4.4 g  Mashed potatoes,  cup / 1.6 g  Lettuce, 1 cup / 0.5 g  Corn (canned),  cup / 1.6 g  Tomato,  cup / 1.1 g Document Released: 07/31/2005 Document Revised: 01/30/2012 Document Reviewed: 11/02/2011 Exit[employer] Patient Information 2015 [place], [place]. This information is not intended to replace advice given to you by your health care provider. Make sure you discuss any questions you have with your health care provider.

## 2014-11-20 NOTE — Op Note (Signed)
COLONOSCOPY PROCEDURE REPORT  PATIENT:  Hannah Reynolds  MR#:  [id number] Birthdate:  08-10-1946, 69 y.o., female Endoscopist:  Dr. Rogene Houston, MD Referred By:  Dr. Delphina Cahill, MD Procedure Date: 11/20/2014  Procedure:   Colonoscopy  Indications:  Patient is 69 year old Caucasian female who was found to have heme-positive stool and is undergoing diagnostic colonoscopy. She has declined to undergo screening colonoscopy in the past. History significant for right hemicolectomy in 1999 traumatic injury. Patient is on NSAID therapy.  Informed Consent:  The procedure and risks were reviewed with the patient and informed consent was obtained.  Medications:  Demerol 50 mg IV Versed 15 mg IV Meclizine 12.5 mg IV and diluted form  Description of procedure:  After a digital rectal exam was performed, that colonoscope was advanced from the anus through the rectum and colon to the area of hepatic flexure where ileocolonic anastomosis was identified. From this point scope was  slowly and cautiously withdrawn. The mucosal surfaces were carefully surveyed utilizing scope tip to flexion to facilitate fold flattening as needed. The scope was pulled down into the rectum where a thorough exam including retroflexion was performed.  Findings:   Prep excellent. Prep satisfactory. Small diverticulum involving distal small bowel to left anastomosis. Few scattered diverticula at sigmoid colon. Normal rectal mucosa. Small hemorrhoids below the dentate line.   Therapeutic/Diagnostic Maneuvers Performed:   None  Complications:  None  Colonhdrawal Time:  9 minutes  Impression:  Examination performed to ileocolonic anastomosis located in the region of hepatic flexure. Single small bowel diverticulum proximal to anastomosis. Mild sigmoid colon diverticulosis. Small external hemorrhoids.  Comment; Heme positive stool possibly related to chronic NSAID use. Since H&H is normal and patient has no symptoms no  further workup at this time.   Recommendations:  Standard instructions given. High-fiber diet. Issue advised to discontinue Naprosyn and use ibuprofen at a low-dose on as-needed basis. She will have H&H checked by Dr. Delphina Cahill in 3-4 months  Kalyb Pemble U  11/20/2014 11:30 AM  CC: Dr. Delphina Cahill, MD & Dr. Rayne Du ref. provider found

## 2014-11-20 NOTE — H&P (Signed)
Hannah Reynolds is an 69 y.o. female.   Chief Complaint: Patient is here for colonoscopy. HPI: Patient is 69 year old Caucasian female who was noted to have heme-positive stool and therefore undergoing diagnostic colonoscopy. She denies abdominal pain change in bowel habits or rectal bleeding. Patient has never been screened for [facility] for Recent H&H was normal. Family history is negative for CRC.  Past Medical History  Diagnosis Date  . GERD (gastroesophageal reflux disease)   . Hyperlipidemia   . Hypertension   . Unspecified glaucoma   . Anxiety   . CAD (coronary artery disease)   . Nephrolithiasis   . H/O: GI bleed     Past Surgical History  Procedure Laterality Date  . Hernia repair    .  Right hemicolectomy in 1999 during exploratory surgery     . Abdominal exploration surgery      ileus  . Ventral hernia repair    . Incisional hernia repair      Family History  Problem Relation Age of Onset  . Heart disease Mother     MI 55s, nonsmoker  . Heart disease Father     MI 41, nonsmoker  . Heart disease Brother     MI 51, nonsmoker   Social History:  reports that she has never smoked. She does not have any smokeless tobacco history on file. She reports that she does not drink alcohol or use illicit drugs.  Allergies: No Known Allergies  Medications Prior to Admission  Medication Sig Dispense Refill  . ALPRAZolam (XANAX) 0.5 MG tablet TAKE (1) TABLET BY MOUTH TWICE DAILY AS NEEDED FOR ANXIETY. 60 tablet 1  . aspirin EC 81 MG tablet Take 81 mg by mouth daily.    Marland Kitchen docusate sodium (COLACE) 100 MG capsule Take 100 mg by mouth 2 (two) times daily.    Marland Kitchen ibuprofen (ADVIL,MOTRIN) 800 MG tablet TAKE (1) TABLET BY MOUTH (3) TIMES DAILY. (Patient taking differently: Take 800 mg by mouth every 8 (eight) hours as needed for moderate pain. TAKE (1) TABLET BY MOUTH (3) TIMES DAILY.) 90 tablet 1  . isosorbide mononitrate (IMDUR) 30 MG 24 hr tablet Take 1 tablet (30 mg total) by mouth  daily. 30 tablet 2  . lisinopril (PRINIVIL,ZESTRIL) 20 MG tablet TAKE ONE TABLET BY MOUTH ONCE DAILY. 30 tablet 11  . naproxen (NAPROSYN) 500 MG tablet Take 500 mg by mouth 2 (two) times daily with a meal.    . peg 3350 powder (MOVIPREP) 100 G SOLR Take 1 kit (200 g total) by mouth once. 1 kit 0  . simvastatin (ZOCOR) 20 MG tablet Take 1 tablet (20 mg total) by mouth daily at 6 PM. 90 tablet 1  . timolol (BETIMOL) 0.5 % ophthalmic solution Place 1 drop into both eyes 2 (two) times daily.     Marland Kitchen triamterene-hydrochlorothiazide (MAXZIDE-25) 37.5-25 MG per tablet TAKE ONE TABLET DAILY AS DIRECTED. 30 tablet 6  . verapamil (CALAN-SR) 180 MG CR tablet Take 180 mg by mouth at bedtime.    . Butalbital-APAP-Caffeine 50-300-40 MG CAPS Take 1 capsule by mouth 2 (two) times daily as needed (migraine headache). 30 capsule 2    No results found for this or any previous visit (from the past 48 hour(s)). No results found.  ROS  Blood pressure 135/67, pulse 78, temperature 97.8 F (36.6 C), temperature source Oral, resp. rate 22, height _0  (1.676 m), weight 193 lb (87.544 kg), SpO2 93 %. Physical Exam  Constitutional: She appears well-developed and  well-nourished.  HENT:  Mouth/Throat: Oropharynx is clear and moist.  Eyes: Conjunctivae are normal. No scleral icterus.  Neck: No thyromegaly present.  Cardiovascular: Normal rate, regular rhythm and normal heart sounds.   No murmur heard. Respiratory: Effort normal and breath sounds normal.  GI: Soft. She exhibits no distension and no mass. There is no tenderness.  Midline scar  Musculoskeletal: She exhibits no edema.  Lymphadenopathy:    She has no cervical adenopathy.  Neurological: She is alert.  Skin: Skin is warm and dry.     Assessment/Plan Heme-positive stool. Diagnostic colonoscopy  Hannah Reynolds U 11/20/2014, 10:42 AM

## 2014-11-24 ENCOUNTER — Encounter (HOSPITAL_COMMUNITY): Payer: Self-pay | Admitting: Internal Medicine

## 2014-11-26 ENCOUNTER — Encounter (INDEPENDENT_AMBULATORY_CARE_PROVIDER_SITE_OTHER): Payer: Self-pay

## 2014-12-09 DIAGNOSIS — S20469A Insect bite (nonvenomous) of unspecified back wall of thorax, initial encounter: Secondary | ICD-10-CM | POA: Diagnosis not present

## 2014-12-12 DIAGNOSIS — L237 Allergic contact dermatitis due to plants, except food: Secondary | ICD-10-CM | POA: Diagnosis not present

## 2014-12-16 DIAGNOSIS — L237 Allergic contact dermatitis due to plants, except food: Secondary | ICD-10-CM | POA: Diagnosis not present

## 2015-02-12 ENCOUNTER — Other Ambulatory Visit (HOSPITAL_COMMUNITY): Payer: Self-pay | Admitting: Internal Medicine

## 2015-02-12 DIAGNOSIS — M8580 Other specified disorders of bone density and structure, unspecified site: Secondary | ICD-10-CM

## 2015-02-16 DIAGNOSIS — E782 Mixed hyperlipidemia: Secondary | ICD-10-CM | POA: Diagnosis not present

## 2015-02-16 DIAGNOSIS — I10 Essential (primary) hypertension: Secondary | ICD-10-CM | POA: Diagnosis not present

## 2015-02-19 ENCOUNTER — Ambulatory Visit (HOSPITAL_COMMUNITY)
Admission: RE | Admit: 2015-02-19 | Discharge: 2015-02-19 | Disposition: A | Discharge status: Home / Self Care | Payer: Medicare Other | Source: Ambulatory Visit | Attending: Internal Medicine | Admitting: Internal Medicine

## 2015-02-19 DIAGNOSIS — M810 Age-related osteoporosis without current pathological fracture: Secondary | ICD-10-CM | POA: Diagnosis not present

## 2015-02-19 DIAGNOSIS — M8580 Other specified disorders of bone density and structure, unspecified site: Secondary | ICD-10-CM | POA: Insufficient documentation

## 2015-02-19 DIAGNOSIS — R29890 Loss of height: Secondary | ICD-10-CM | POA: Diagnosis not present

## 2015-02-19 DIAGNOSIS — Z780 Asymptomatic menopausal state: Secondary | ICD-10-CM | POA: Diagnosis not present

## 2015-03-05 DIAGNOSIS — M810 Age-related osteoporosis without current pathological fracture: Secondary | ICD-10-CM | POA: Diagnosis not present

## 2015-03-05 DIAGNOSIS — E782 Mixed hyperlipidemia: Secondary | ICD-10-CM | POA: Diagnosis not present

## 2015-03-05 DIAGNOSIS — I10 Essential (primary) hypertension: Secondary | ICD-10-CM | POA: Diagnosis not present

## 2015-05-25 DIAGNOSIS — Z23 Encounter for immunization: Secondary | ICD-10-CM | POA: Diagnosis not present

## 2015-06-18 DIAGNOSIS — Z23 Encounter for immunization: Secondary | ICD-10-CM | POA: Diagnosis not present

## 2015-06-30 DIAGNOSIS — L237 Allergic contact dermatitis due to plants, except food: Secondary | ICD-10-CM | POA: Diagnosis not present

## 2015-09-02 DIAGNOSIS — E782 Mixed hyperlipidemia: Secondary | ICD-10-CM | POA: Diagnosis not present

## 2015-09-02 DIAGNOSIS — I10 Essential (primary) hypertension: Secondary | ICD-10-CM | POA: Diagnosis not present

## 2015-09-06 DIAGNOSIS — E782 Mixed hyperlipidemia: Secondary | ICD-10-CM | POA: Diagnosis not present

## 2015-09-06 DIAGNOSIS — I10 Essential (primary) hypertension: Secondary | ICD-10-CM | POA: Diagnosis not present

## 2015-09-06 DIAGNOSIS — F411 Generalized anxiety disorder: Secondary | ICD-10-CM | POA: Diagnosis not present

## 2015-09-07 ENCOUNTER — Other Ambulatory Visit: Payer: Self-pay | Admitting: Family Medicine

## 2015-12-06 ENCOUNTER — Other Ambulatory Visit: Payer: Self-pay | Admitting: Family Medicine

## 2015-12-06 NOTE — Telephone Encounter (Signed)
Refill ok? 

## 2015-12-06 NOTE — Telephone Encounter (Signed)
Pt hasn't been seen since 2015

## 2015-12-06 NOTE — Telephone Encounter (Signed)
Spoke with pt and pt no loner sees you. Refill request handled by Dr. Nevada Crane.

## 2015-12-06 NOTE — Telephone Encounter (Signed)
Yes thanks, may give #5 only but needs visit scheduled within a month

## 2016-01-06 DIAGNOSIS — K047 Periapical abscess without sinus: Secondary | ICD-10-CM | POA: Diagnosis not present

## 2016-01-06 DIAGNOSIS — J040 Acute laryngitis: Secondary | ICD-10-CM | POA: Diagnosis not present

## 2016-01-11 DIAGNOSIS — J040 Acute laryngitis: Secondary | ICD-10-CM | POA: Diagnosis not present

## 2016-02-28 DIAGNOSIS — E782 Mixed hyperlipidemia: Secondary | ICD-10-CM | POA: Diagnosis not present

## 2016-03-01 DIAGNOSIS — E782 Mixed hyperlipidemia: Secondary | ICD-10-CM | POA: Diagnosis not present

## 2016-03-01 DIAGNOSIS — I10 Essential (primary) hypertension: Secondary | ICD-10-CM | POA: Diagnosis not present

## 2016-03-01 DIAGNOSIS — F411 Generalized anxiety disorder: Secondary | ICD-10-CM | POA: Diagnosis not present

## 2016-03-06 DIAGNOSIS — H524 Presbyopia: Secondary | ICD-10-CM | POA: Diagnosis not present

## 2016-03-06 DIAGNOSIS — H401331 Pigmentary glaucoma, bilateral, mild stage: Secondary | ICD-10-CM | POA: Diagnosis not present

## 2016-03-06 DIAGNOSIS — H5211 Myopia, right eye: Secondary | ICD-10-CM | POA: Diagnosis not present

## 2016-03-06 DIAGNOSIS — H52223 Regular astigmatism, bilateral: Secondary | ICD-10-CM | POA: Diagnosis not present

## 2016-04-07 ENCOUNTER — Telehealth: Payer: Self-pay | Admitting: Orthopaedic Surgery

## 2016-05-25 DIAGNOSIS — Z23 Encounter for immunization: Secondary | ICD-10-CM | POA: Diagnosis not present

## 2016-06-19 DIAGNOSIS — L918 Other hypertrophic disorders of the skin: Secondary | ICD-10-CM | POA: Diagnosis not present

## 2016-06-19 DIAGNOSIS — L821 Other seborrheic keratosis: Secondary | ICD-10-CM | POA: Diagnosis not present

## 2016-06-19 DIAGNOSIS — L820 Inflamed seborrheic keratosis: Secondary | ICD-10-CM | POA: Diagnosis not present

## 2016-07-18 ENCOUNTER — Other Ambulatory Visit: Payer: Self-pay | Admitting: Family Medicine

## 2016-08-29 DIAGNOSIS — I10 Essential (primary) hypertension: Secondary | ICD-10-CM | POA: Diagnosis not present

## 2016-08-29 DIAGNOSIS — E782 Mixed hyperlipidemia: Secondary | ICD-10-CM | POA: Diagnosis not present

## 2016-09-05 DIAGNOSIS — I10 Essential (primary) hypertension: Secondary | ICD-10-CM | POA: Diagnosis not present

## 2016-09-05 DIAGNOSIS — Z6832 Body mass index (BMI) 32.0-32.9, adult: Secondary | ICD-10-CM | POA: Diagnosis not present

## 2016-09-05 DIAGNOSIS — E782 Mixed hyperlipidemia: Secondary | ICD-10-CM | POA: Diagnosis not present

## 2016-09-05 DIAGNOSIS — F411 Generalized anxiety disorder: Secondary | ICD-10-CM | POA: Diagnosis not present

## 2016-09-19 ENCOUNTER — Other Ambulatory Visit: Payer: Self-pay | Admitting: Family Medicine

## 2016-09-19 NOTE — Telephone Encounter (Signed)
Spoke to patient  In regards to a prescription request for TRIAMTERENE- HCTZ 37.5-25 Mg, which was prescribed by Dr Arnoldo Morale who is no longer with the practice. Patient stated that she will have her primary Dr Nevada Crane fill the prescription for her.

## 2016-11-03 DIAGNOSIS — Z6833 Body mass index (BMI) 33.0-33.9, adult: Secondary | ICD-10-CM | POA: Diagnosis not present

## 2016-11-03 DIAGNOSIS — R21 Rash and other nonspecific skin eruption: Secondary | ICD-10-CM | POA: Diagnosis not present

## 2016-11-07 DIAGNOSIS — R21 Rash and other nonspecific skin eruption: Secondary | ICD-10-CM | POA: Diagnosis not present

## 2016-11-07 DIAGNOSIS — Z6833 Body mass index (BMI) 33.0-33.9, adult: Secondary | ICD-10-CM | POA: Diagnosis not present

## 2016-12-18 ENCOUNTER — Telehealth: Payer: Self-pay | Admitting: Orthopaedic Surgery

## 2017-01-03 DIAGNOSIS — Z6832 Body mass index (BMI) 32.0-32.9, adult: Secondary | ICD-10-CM | POA: Diagnosis not present

## 2017-01-03 DIAGNOSIS — S00469A Insect bite (nonvenomous) of unspecified ear, initial encounter: Secondary | ICD-10-CM | POA: Diagnosis not present

## 2017-02-27 DIAGNOSIS — I10 Essential (primary) hypertension: Secondary | ICD-10-CM | POA: Diagnosis not present

## 2017-02-27 DIAGNOSIS — Z1159 Encounter for screening for other viral diseases: Secondary | ICD-10-CM | POA: Diagnosis not present

## 2017-03-05 ENCOUNTER — Other Ambulatory Visit: Payer: Self-pay

## 2017-03-17 DIAGNOSIS — I10 Essential (primary) hypertension: Secondary | ICD-10-CM | POA: Diagnosis not present

## 2017-03-17 DIAGNOSIS — E782 Mixed hyperlipidemia: Secondary | ICD-10-CM | POA: Diagnosis not present

## 2017-03-17 DIAGNOSIS — R5383 Other fatigue: Secondary | ICD-10-CM | POA: Diagnosis not present

## 2017-03-17 DIAGNOSIS — F411 Generalized anxiety disorder: Secondary | ICD-10-CM | POA: Diagnosis not present

## 2017-03-20 DIAGNOSIS — E039 Hypothyroidism, unspecified: Secondary | ICD-10-CM | POA: Diagnosis not present

## 2017-04-10 DIAGNOSIS — Z6833 Body mass index (BMI) 33.0-33.9, adult: Secondary | ICD-10-CM | POA: Diagnosis not present

## 2017-04-10 DIAGNOSIS — L237 Allergic contact dermatitis due to plants, except food: Secondary | ICD-10-CM | POA: Diagnosis not present

## 2017-04-24 DIAGNOSIS — Z6832 Body mass index (BMI) 32.0-32.9, adult: Secondary | ICD-10-CM | POA: Diagnosis not present

## 2017-04-24 DIAGNOSIS — M79606 Pain in leg, unspecified: Secondary | ICD-10-CM | POA: Diagnosis not present

## 2017-05-14 DIAGNOSIS — Z23 Encounter for immunization: Secondary | ICD-10-CM | POA: Diagnosis not present

## 2017-06-25 DIAGNOSIS — I10 Essential (primary) hypertension: Secondary | ICD-10-CM | POA: Diagnosis not present

## 2017-06-27 DIAGNOSIS — I10 Essential (primary) hypertension: Secondary | ICD-10-CM | POA: Diagnosis not present

## 2017-06-27 DIAGNOSIS — F411 Generalized anxiety disorder: Secondary | ICD-10-CM | POA: Diagnosis not present

## 2017-06-27 DIAGNOSIS — E782 Mixed hyperlipidemia: Secondary | ICD-10-CM | POA: Diagnosis not present

## 2017-06-27 DIAGNOSIS — Z6833 Body mass index (BMI) 33.0-33.9, adult: Secondary | ICD-10-CM | POA: Diagnosis not present

## 2017-06-27 DIAGNOSIS — Z0000 Encounter for general adult medical examination without abnormal findings: Secondary | ICD-10-CM | POA: Diagnosis not present

## 2017-07-02 DIAGNOSIS — H5211 Myopia, right eye: Secondary | ICD-10-CM | POA: Diagnosis not present

## 2017-07-02 DIAGNOSIS — H401331 Pigmentary glaucoma, bilateral, mild stage: Secondary | ICD-10-CM | POA: Diagnosis not present

## 2017-07-02 DIAGNOSIS — H52223 Regular astigmatism, bilateral: Secondary | ICD-10-CM | POA: Diagnosis not present

## 2017-07-02 DIAGNOSIS — H524 Presbyopia: Secondary | ICD-10-CM | POA: Diagnosis not present

## 2017-08-01 DIAGNOSIS — H401331 Pigmentary glaucoma, bilateral, mild stage: Secondary | ICD-10-CM | POA: Diagnosis not present

## 2017-08-01 DIAGNOSIS — H401131 Primary open-angle glaucoma, bilateral, mild stage: Secondary | ICD-10-CM | POA: Diagnosis not present

## 2017-08-29 ENCOUNTER — Other Ambulatory Visit (HOSPITAL_COMMUNITY): Payer: Self-pay | Admitting: Internal Medicine

## 2017-08-29 DIAGNOSIS — Z780 Asymptomatic menopausal state: Secondary | ICD-10-CM

## 2017-10-30 ENCOUNTER — Other Ambulatory Visit (HOSPITAL_COMMUNITY): Payer: Self-pay | Admitting: Internal Medicine

## 2017-10-30 DIAGNOSIS — Z1231 Encounter for screening mammogram for malignant neoplasm of breast: Secondary | ICD-10-CM

## 2017-11-01 ENCOUNTER — Ambulatory Visit (HOSPITAL_COMMUNITY)
Admission: RE | Admit: 2017-11-01 | Discharge: 2017-11-01 | Disposition: A | Discharge status: Home / Self Care | Payer: Medicare Other | Source: Ambulatory Visit | Attending: Internal Medicine | Admitting: Internal Medicine

## 2017-11-01 DIAGNOSIS — Z1231 Encounter for screening mammogram for malignant neoplasm of breast: Secondary | ICD-10-CM | POA: Insufficient documentation

## 2017-11-05 ENCOUNTER — Other Ambulatory Visit (HOSPITAL_COMMUNITY): Payer: Self-pay | Admitting: Internal Medicine

## 2017-11-05 DIAGNOSIS — R928 Other abnormal and inconclusive findings on diagnostic imaging of breast: Secondary | ICD-10-CM

## 2017-11-13 ENCOUNTER — Ambulatory Visit (HOSPITAL_COMMUNITY)
Admission: RE | Admit: 2017-11-13 | Discharge: 2017-11-13 | Disposition: A | Discharge status: Home / Self Care | Payer: Medicare Other | Source: Ambulatory Visit | Attending: Internal Medicine | Admitting: Internal Medicine

## 2017-11-13 ENCOUNTER — Encounter (HOSPITAL_COMMUNITY): Payer: Self-pay

## 2017-11-13 ENCOUNTER — Other Ambulatory Visit (HOSPITAL_COMMUNITY): Payer: Self-pay | Admitting: Internal Medicine

## 2017-11-13 DIAGNOSIS — D241 Benign neoplasm of right breast: Secondary | ICD-10-CM | POA: Diagnosis not present

## 2017-11-13 DIAGNOSIS — D242 Benign neoplasm of left breast: Secondary | ICD-10-CM | POA: Diagnosis not present

## 2017-11-13 DIAGNOSIS — N6002 Solitary cyst of left breast: Secondary | ICD-10-CM | POA: Diagnosis not present

## 2017-11-13 DIAGNOSIS — R928 Other abnormal and inconclusive findings on diagnostic imaging of breast: Secondary | ICD-10-CM

## 2017-12-17 DIAGNOSIS — E782 Mixed hyperlipidemia: Secondary | ICD-10-CM | POA: Diagnosis not present

## 2017-12-17 DIAGNOSIS — L237 Allergic contact dermatitis due to plants, except food: Secondary | ICD-10-CM | POA: Diagnosis not present

## 2017-12-17 DIAGNOSIS — Z6833 Body mass index (BMI) 33.0-33.9, adult: Secondary | ICD-10-CM | POA: Diagnosis not present

## 2017-12-17 DIAGNOSIS — B370 Candidal stomatitis: Secondary | ICD-10-CM | POA: Diagnosis not present

## 2017-12-17 DIAGNOSIS — F411 Generalized anxiety disorder: Secondary | ICD-10-CM | POA: Diagnosis not present

## 2017-12-17 DIAGNOSIS — I10 Essential (primary) hypertension: Secondary | ICD-10-CM | POA: Diagnosis not present

## 2017-12-17 DIAGNOSIS — Z0000 Encounter for general adult medical examination without abnormal findings: Secondary | ICD-10-CM | POA: Diagnosis not present

## 2017-12-28 DIAGNOSIS — I10 Essential (primary) hypertension: Secondary | ICD-10-CM | POA: Diagnosis not present

## 2017-12-28 DIAGNOSIS — E782 Mixed hyperlipidemia: Secondary | ICD-10-CM | POA: Diagnosis not present

## 2018-01-01 DIAGNOSIS — R0602 Shortness of breath: Secondary | ICD-10-CM | POA: Diagnosis not present

## 2018-01-01 DIAGNOSIS — E782 Mixed hyperlipidemia: Secondary | ICD-10-CM | POA: Diagnosis not present

## 2018-01-01 DIAGNOSIS — I10 Essential (primary) hypertension: Secondary | ICD-10-CM | POA: Diagnosis not present

## 2018-01-01 DIAGNOSIS — Z6832 Body mass index (BMI) 32.0-32.9, adult: Secondary | ICD-10-CM | POA: Diagnosis not present

## 2018-01-01 DIAGNOSIS — F411 Generalized anxiety disorder: Secondary | ICD-10-CM | POA: Diagnosis not present

## 2018-02-04 ENCOUNTER — Emergency Department (HOSPITAL_COMMUNITY): Payer: Medicare Other

## 2018-02-04 ENCOUNTER — Encounter (HOSPITAL_COMMUNITY): Payer: Self-pay

## 2018-02-04 ENCOUNTER — Emergency Department (HOSPITAL_COMMUNITY)
Admission: EM | Admit: 2018-02-04 | Discharge: 2018-02-04 | Disposition: A | Discharge status: Home / Self Care | Payer: Medicare Other | Attending: Emergency Medicine | Admitting: Emergency Medicine

## 2018-02-04 DIAGNOSIS — R748 Abnormal levels of other serum enzymes: Secondary | ICD-10-CM | POA: Diagnosis not present

## 2018-02-04 DIAGNOSIS — I2510 Atherosclerotic heart disease of native coronary artery without angina pectoris: Secondary | ICD-10-CM | POA: Diagnosis not present

## 2018-02-04 DIAGNOSIS — R7989 Other specified abnormal findings of blood chemistry: Secondary | ICD-10-CM | POA: Insufficient documentation

## 2018-02-04 DIAGNOSIS — Z7982 Long term (current) use of aspirin: Secondary | ICD-10-CM | POA: Insufficient documentation

## 2018-02-04 DIAGNOSIS — Z79899 Other long term (current) drug therapy: Secondary | ICD-10-CM | POA: Diagnosis not present

## 2018-02-04 DIAGNOSIS — I10 Essential (primary) hypertension: Secondary | ICD-10-CM | POA: Insufficient documentation

## 2018-02-04 DIAGNOSIS — R0602 Shortness of breath: Secondary | ICD-10-CM | POA: Diagnosis not present

## 2018-02-04 DIAGNOSIS — R0600 Dyspnea, unspecified: Secondary | ICD-10-CM | POA: Insufficient documentation

## 2018-02-04 DIAGNOSIS — Z7722 Contact with and (suspected) exposure to environmental tobacco smoke (acute) (chronic): Secondary | ICD-10-CM | POA: Diagnosis not present

## 2018-02-04 LAB — CBC WITH DIFFERENTIAL/PLATELET
BASOS ABS: 0 10*3/uL (ref 0.0–0.1)
Basophils Relative: 0 %
Eosinophils Absolute: 0.5 10*3/uL (ref 0.0–0.7)
Eosinophils Relative: 6 %
HCT: 37.5 % (ref 36.0–46.0)
Hemoglobin: 12.1 g/dL (ref 12.0–15.0)
LYMPHS PCT: 16 %
Lymphs Abs: 1.4 10*3/uL (ref 0.7–4.0)
MCH: 31 pg (ref 26.0–34.0)
MCHC: 32.3 g/dL (ref 30.0–36.0)
MCV: 96.2 fL (ref 78.0–100.0)
Monocytes Absolute: 0.9 10*3/uL (ref 0.1–1.0)
Monocytes Relative: 11 %
Neutro Abs: 5.7 10*3/uL (ref 1.7–7.7)
Neutrophils Relative %: 67 %
Platelets: 254 10*3/uL (ref 150–400)
RBC: 3.9 MIL/uL (ref 3.87–5.11)
RDW: 14.5 % (ref 11.5–15.5)
WBC: 8.6 10*3/uL (ref 4.0–10.5)

## 2018-02-04 LAB — COMPREHENSIVE METABOLIC PANEL
ALBUMIN: 3.9 g/dL (ref 3.5–5.0)
ALT: 19 U/L (ref 14–54)
AST: 18 U/L (ref 15–41)
Alkaline Phosphatase: 86 U/L (ref 38–126)
Anion gap: 11 (ref 5–15)
BILIRUBIN TOTAL: 0.8 mg/dL (ref 0.3–1.2)
BUN: 18 mg/dL (ref 6–20)
CHLORIDE: 100 mmol/L — AB (ref 101–111)
CO2: 26 mmol/L (ref 22–32)
CREATININE: 1.45 mg/dL — AB (ref 0.44–1.00)
Calcium: 9.5 mg/dL (ref 8.9–10.3)
GFR calc Af Amer: 42 mL/min — ABNORMAL LOW (ref >60)
GFR, EST NON AFRICAN AMERICAN: 36 mL/min — AB (ref >60)
GLUCOSE: 115 mg/dL — AB (ref 65–99)
Potassium: 3.4 mmol/L — ABNORMAL LOW (ref 3.5–5.1)
Sodium: 137 mmol/L (ref 135–145)
TOTAL PROTEIN: 7.3 g/dL (ref 6.5–8.1)

## 2018-02-04 LAB — D-DIMER, QUANTITATIVE: D-Dimer, Quant: 0.45 ug/mL-FEU (ref 0.00–0.50)

## 2018-02-04 LAB — TROPONIN I

## 2018-02-04 MED ORDER — IOPAMIDOL (ISOVUE-370) INJECTION 76%
100.0000 mL | Freq: Once | INTRAVENOUS | Status: AC | PRN
  Administered 2018-02-04: 100 mL via INTRAVENOUS

## 2018-02-04 NOTE — ED Provider Notes (Signed)
[facility] EMERGENCY DEPARTMENT Provider Note   CSN: [id number] Arrival date & time: 02/04/18  1300     History   Chief Complaint Chief Complaint  Patient presents with  . Shortness of Breath    HPI Hannah Reynolds is a 72 y.o. female.  Patient reports dyspnea since Thursday.  She blames her symptoms on exposure to poison oak.  She has been on prednisone and Keflex for this event along with Mucinex and inhaler..  Review of systems positive for cough, but no substernal chest pain, nausea, diaphoresis.  Symptoms are worse with exertion.  Severity is moderate.  No prolonged travel, recent surgery, immobilization.     Past Medical History:  Diagnosis Date  . Anxiety   . CAD (coronary artery disease)   . GERD (gastroesophageal reflux disease)   . H/O: GI bleed   . Hyperlipidemia   . Hypertension   . Nephrolithiasis   . Unspecified glaucoma(365.9)     Patient Active Problem List   Diagnosis Date Noted  . Osteoarthritis 06/23/2014  . Migraine 06/23/2014  . Knee pain 03/27/2014  . Muscle weakness (generalized) 03/27/2014  . Meniscal injury 03/27/2014  . Contact dermatitis 04/13/2009  . DEPENDENT EDEMA, LEGS 03/27/2007  . SYMPTOM, SWELLING, ABDOMINAL, GENERALIZED 03/27/2007  . Hyperlipidemia 03/14/2007  . Generalized anxiety disorder 03/14/2007  . Glaucoma 03/14/2007  . Essential hypertension 03/14/2007  . NEPHROLITHIASIS, HX OF 03/14/2007    Past Surgical History:  Procedure Laterality Date  . ABDOMINAL EXPLORATION SURGERY     ileus  . COLONOSCOPY N/A 11/20/2014   Procedure: COLONOSCOPY;  Surgeon: Rogene Houston, MD;  Location: [facility];  Service: Endoscopy;  Laterality: N/A;  730 with pediatric scope  . hemicolection in 1999 during exploratory surgery     . HERNIA REPAIR    . INCISIONAL HERNIA REPAIR    . VENTRAL HERNIA REPAIR       OB History   None      Home Medications    Prior to Admission medications   Medication Sig Start Date End Date  Taking? Authorizing Provider  ALPRAZolam (XANAX) 0.5 MG tablet TAKE (1) TABLET BY MOUTH TWICE DAILY AS NEEDED FOR ANXIETY. 07/27/14  Yes Marin Olp, MD  aspirin EC 81 MG tablet Take 81 mg by mouth daily.   Yes [provider]  Butalbital-APAP-Caffeine 50-300-40 MG CAPS Take 1 capsule by mouth 2 (two) times daily as needed (migraine headache). 07/22/14  Yes Marin Olp, MD  isosorbide mononitrate (IMDUR) 30 MG 24 hr tablet Take 1 tablet (30 mg total) by mouth daily. 07/22/14  Yes Marin Olp, MD  lisinopril (PRINIVIL,ZESTRIL) 20 MG tablet TAKE ONE TABLET BY MOUTH ONCE DAILY. 09/06/13  Yes Ricard Dillon, MD  simvastatin (ZOCOR) 20 MG tablet Take 1 tablet (20 mg total) by mouth daily at 6 PM. 07/08/14  Yes Marin Olp, MD  timolol (BETIMOL) 0.5 % ophthalmic solution Place 1 drop into both eyes daily.    Yes [provider]  triamterene-hydrochlorothiazide (MAXZIDE-25) 37.5-25 MG tablet TAKE ONE TABLET DAILY AS DIRECTED. Patient taking differently: TAKE ONE TABLET DAILY 09/07/15  Yes Marin Olp, MD  VENTOLIN HFA 108 [phone] Base) MCG/ACT inhaler Inhale 1-2 puffs into the lungs every 6 (six) hours as needed for wheezing or shortness of breath.  01/01/18  Yes [provider]  verapamil (CALAN-SR) 180 MG CR tablet Take 180 mg by mouth at bedtime.   Yes [provider]    Family  History Family History  Problem Relation Age of Onset  . Heart disease Mother        MI 73s, nonsmoker  . Heart disease Father        MI 44, nonsmoker  . Heart disease Brother        MI 46, nonsmoker    Social History Social History   Tobacco Use  . Smoking status: Never Smoker  . Tobacco comment: second hand smoke exposure  Substance Use Topics  . Alcohol use: No    Alcohol/week: 0.0 oz  . Drug use: No     Allergies   Patient has no known allergies.   Review of Systems Review of Systems  All other systems reviewed and are  negative.    Physical Exam Updated Vital Signs BP 127/62 (BP Location: Left Arm)   Pulse 84   Temp 98.1 F (36.7 C) (Oral)   Resp (!) 24   Ht 5\' 6"  (1.676 m)   Wt 88.5 kg (195 lb)   SpO2 96%   BMI 31.47 kg/m   Physical Exam  Constitutional: She is oriented to person, place, and time. She appears well-developed and well-nourished.  HENT:  Head: Normocephalic and atraumatic.  Eyes: Conjunctivae are normal.  Neck: Neck supple.  Cardiovascular: Normal rate and regular rhythm.  Pulmonary/Chest: Effort normal and breath sounds normal.  Abdominal: Soft. Bowel sounds are normal.  Musculoskeletal: Normal range of motion.  Neurological: She is alert and oriented to person, place, and time.  Skin: Skin is warm and dry.  Psychiatric: She has a normal mood and affect. Her behavior is normal.  Nursing note and vitals reviewed.    ED Treatments / Results  Labs (all labs ordered are listed, but only abnormal results are displayed) Labs Reviewed  COMPREHENSIVE METABOLIC PANEL - Abnormal; Notable for the following components:      Result Value   Potassium 3.4 (*)    Chloride 100 (*)    Glucose, Bld 115 (*)    Creatinine, Ser 1.45 (*)    GFR calc non Af Amer 36 (*)    GFR calc Af Amer 42 (*)    All other components within normal limits  CBC WITH DIFFERENTIAL/PLATELET  TROPONIN I  D-DIMER, QUANTITATIVE (NOT AT [facility])    EKG EKG Interpretation  Date/Time:  Monday February 04 2018 13:22:39 EDT Ventricular Rate:  89 PR Interval:    QRS Duration: 86 QT Interval:  366 QTC Calculation: 446 R Axis:   54 Text Interpretation:  Sinus rhythm Low voltage, precordial leads Abnormal R-wave progression, early transition Confirmed by Nat Christen [phone]) on 02/04/2018 1:48:07 PM   Radiology Dg Chest 2 View  Result Date: 02/04/2018 CLINICAL DATA:  Shortness of breath since 01/31/2018. EXAM: CHEST - 2 VIEW COMPARISON:  08/23/2005 FINDINGS: The heart size and pulmonary vascularity are normal  and the lungs are clear. Chronic slight elevation of the left hemidiaphragm. Chronic accentuation of the thoracic kyphosis. IMPRESSION: No acute abnormalities. Electronically Signed   By: Lorriane Shire M.D.   On: 02/04/2018 13:50   Ct Angio Chest Pe W And/or Wo Contrast  Result Date: 02/04/2018 CLINICAL DATA:  Cough, shortness of breath. EXAM: CT ANGIOGRAPHY CHEST WITH CONTRAST TECHNIQUE: Multidetector CT imaging of the chest was performed using the standard protocol during bolus administration of intravenous contrast. Multiplanar CT image reconstructions and MIPs were obtained to evaluate the vascular anatomy. CONTRAST:  1103mL ISOVUE-370 IOPAMIDOL (ISOVUE-370) INJECTION 76% COMPARISON:  Radiographs of same day.  CT scan  of August 23, 2005. FINDINGS: Cardiovascular: Satisfactory opacification of the pulmonary arteries to the segmental level. No evidence of pulmonary embolism. Normal heart size. No pericardial effusion. Mediastinum/Nodes: No enlarged mediastinal, hilar, or axillary lymph nodes. Thyroid gland, trachea, and esophagus demonstrate no significant findings. Lungs/Pleura: No pneumothorax or pleural effusion is noted. Minimal posterior basilar subsegmental atelectasis is noted. Upper Abdomen: No acute abnormality. Musculoskeletal: No chest wall abnormality. No acute or significant osseous findings. Review of the MIP images confirms the above findings. IMPRESSION: No definite evidence of pulmonary embolus. Minimal posterior basilar subsegmental atelectasis. Electronically Signed   By: Marijo Conception, M.D.   On: 02/04/2018 15:58    Procedures Procedures (including critical care time)  Medications Ordered in ED Medications  iopamidol (ISOVUE-370) 76 % injection 100 mL (100 mLs Intravenous Contrast Given 02/04/18 1528)     Initial Impression / Assessment and Plan / ED Course  I have reviewed the triage vital signs and the nursing notes.  Pertinent labs & imaging results that were available  during my care of the patient were reviewed by me and considered in my medical decision making (see chart for details).     Patient presents with dyspnea for several days.  She is in no acute distress.  My respiratory exam revealed no significant anomalies.  Work-up including EKG, chest x-ray, troponin, CT angiogram of chest showed no acute pathology.  I suggested this could be cardiac in origin.  She will follow-up with cardiology.  Elevated creatinine discussed  Final Clinical Impressions(s) / ED Diagnoses   Final diagnoses:  Dyspnea, unspecified type  Elevated serum creatinine    ED Discharge Orders    None       Nat Christen, MD 02/04/18 1819

## 2018-02-04 NOTE — ED Triage Notes (Addendum)
Pt reports that shortness of breath started last Thursday and has a hacking cough. Pt has been using inhaler and mucinex. But cont's to have difficulty taking deep breath Denies pain

## 2018-02-04 NOTE — Progress Notes (Signed)
The patient complained of pain in the right forearm at the site of injection after the completion of the exam. There is slight swelling and tenderness at that site, but the skin is well perfused and there is no significant pain with movement of the muscles at that site. The tissue is soft. The bolus of contrast for the exam entered without difficulty as indicated by good opacification of the vessels on the CT scan.  An ice compress was applied and detailed instructions given to the patient regarding what to look out for. We will contact the patient tomorrow for followup. She was given contact information to use if she develops any concerns or problems prior to that call.

## 2018-02-04 NOTE — Discharge Instructions (Addendum)
Tests showed no life-threatening condition.  Specifically, no pneumonia or blood clot in your lungs were noted.  Use your inhaler.  Recommend cardiology visit. Phone number given.

## 2018-02-05 NOTE — Progress Notes (Signed)
This patient has received 10 ml's of IV isovue (type of contrast) contrast extravasation and 20 mls of normal saline into RT upper forearm (part of body) during a CT Angio Chest exam.  The exam was performed on 6.24.2019  Site / affected area assessed by Dr Zigmund Daniel

## 2018-04-05 ENCOUNTER — Other Ambulatory Visit: Payer: Self-pay | Admitting: Orthopaedic Surgery

## 2018-04-05 NOTE — Telephone Encounter (Signed)
Will you refill Naprosyn for Dr Luna Glasgow patient ?

## 2018-05-24 ENCOUNTER — Other Ambulatory Visit: Payer: Self-pay | Admitting: Orthopedic Surgery

## 2018-06-05 DIAGNOSIS — Z23 Encounter for immunization: Secondary | ICD-10-CM | POA: Diagnosis not present

## 2018-06-28 DIAGNOSIS — I10 Essential (primary) hypertension: Secondary | ICD-10-CM | POA: Diagnosis not present

## 2018-06-28 DIAGNOSIS — E782 Mixed hyperlipidemia: Secondary | ICD-10-CM | POA: Diagnosis not present

## 2018-07-01 ENCOUNTER — Other Ambulatory Visit: Payer: Self-pay

## 2018-07-02 DIAGNOSIS — F419 Anxiety disorder, unspecified: Secondary | ICD-10-CM | POA: Diagnosis not present

## 2018-07-02 DIAGNOSIS — I10 Essential (primary) hypertension: Secondary | ICD-10-CM | POA: Diagnosis not present

## 2018-07-02 DIAGNOSIS — R0602 Shortness of breath: Secondary | ICD-10-CM | POA: Diagnosis not present

## 2018-07-02 DIAGNOSIS — Z0000 Encounter for general adult medical examination without abnormal findings: Secondary | ICD-10-CM | POA: Diagnosis not present

## 2018-07-02 DIAGNOSIS — E782 Mixed hyperlipidemia: Secondary | ICD-10-CM | POA: Diagnosis not present

## 2018-07-04 DIAGNOSIS — D225 Melanocytic nevi of trunk: Secondary | ICD-10-CM | POA: Diagnosis not present

## 2018-07-04 DIAGNOSIS — L820 Inflamed seborrheic keratosis: Secondary | ICD-10-CM | POA: Diagnosis not present

## 2018-09-26 DIAGNOSIS — L255 Unspecified contact dermatitis due to plants, except food: Secondary | ICD-10-CM | POA: Diagnosis not present

## 2018-10-15 ENCOUNTER — Inpatient Hospital Stay (HOSPITAL_COMMUNITY)
Admission: EM | Admit: 2018-10-15 | Discharge: 2018-10-17 | DRG: 392 | Disposition: A | Discharge status: Home / Self Care | Payer: Medicare Other | Attending: Internal Medicine | Admitting: Internal Medicine

## 2018-10-15 ENCOUNTER — Encounter (HOSPITAL_COMMUNITY): Payer: Self-pay | Admitting: Emergency Medicine

## 2018-10-15 ENCOUNTER — Emergency Department (HOSPITAL_COMMUNITY): Payer: Medicare Other

## 2018-10-15 ENCOUNTER — Other Ambulatory Visit: Payer: Self-pay

## 2018-10-15 DIAGNOSIS — E785 Hyperlipidemia, unspecified: Secondary | ICD-10-CM | POA: Diagnosis present

## 2018-10-15 DIAGNOSIS — R197 Diarrhea, unspecified: Secondary | ICD-10-CM

## 2018-10-15 DIAGNOSIS — Z7982 Long term (current) use of aspirin: Secondary | ICD-10-CM | POA: Diagnosis not present

## 2018-10-15 DIAGNOSIS — Z8249 Family history of ischemic heart disease and other diseases of the circulatory system: Secondary | ICD-10-CM

## 2018-10-15 DIAGNOSIS — E782 Mixed hyperlipidemia: Secondary | ICD-10-CM | POA: Diagnosis not present

## 2018-10-15 DIAGNOSIS — K921 Melena: Secondary | ICD-10-CM | POA: Diagnosis present

## 2018-10-15 DIAGNOSIS — K5730 Diverticulosis of large intestine without perforation or abscess without bleeding: Secondary | ICD-10-CM | POA: Diagnosis present

## 2018-10-15 DIAGNOSIS — K8020 Calculus of gallbladder without cholecystitis without obstruction: Secondary | ICD-10-CM | POA: Diagnosis not present

## 2018-10-15 DIAGNOSIS — K602 Anal fissure, unspecified: Secondary | ICD-10-CM

## 2018-10-15 DIAGNOSIS — I2510 Atherosclerotic heart disease of native coronary artery without angina pectoris: Secondary | ICD-10-CM | POA: Diagnosis present

## 2018-10-15 DIAGNOSIS — E66811 Other obesity due to excess calories: Secondary | ICD-10-CM

## 2018-10-15 DIAGNOSIS — I10 Essential (primary) hypertension: Secondary | ICD-10-CM | POA: Diagnosis present

## 2018-10-15 DIAGNOSIS — Z9049 Acquired absence of other specified parts of digestive tract: Secondary | ICD-10-CM | POA: Diagnosis not present

## 2018-10-15 DIAGNOSIS — E6609 Other obesity due to excess calories: Secondary | ICD-10-CM | POA: Diagnosis present

## 2018-10-15 DIAGNOSIS — F419 Anxiety disorder, unspecified: Secondary | ICD-10-CM | POA: Diagnosis present

## 2018-10-15 DIAGNOSIS — R112 Nausea with vomiting, unspecified: Secondary | ICD-10-CM | POA: Diagnosis not present

## 2018-10-15 DIAGNOSIS — Z87442 Personal history of urinary calculi: Secondary | ICD-10-CM | POA: Diagnosis not present

## 2018-10-15 DIAGNOSIS — R8281 Pyuria: Secondary | ICD-10-CM | POA: Diagnosis present

## 2018-10-15 DIAGNOSIS — Z6832 Body mass index (BMI) 32.0-32.9, adult: Secondary | ICD-10-CM

## 2018-10-15 DIAGNOSIS — R9389 Abnormal findings on diagnostic imaging of other specified body structures: Secondary | ICD-10-CM | POA: Diagnosis present

## 2018-10-15 DIAGNOSIS — K529 Noninfective gastroenteritis and colitis, unspecified: Secondary | ICD-10-CM | POA: Diagnosis present

## 2018-10-15 HISTORY — DX: Diverticulosis of intestine, part unspecified, without perforation or abscess without bleeding: K57.90

## 2018-10-15 HISTORY — DX: Unspecified hemorrhoids: K64.9

## 2018-10-15 LAB — URINALYSIS, ROUTINE W REFLEX MICROSCOPIC
BILIRUBIN URINE: NEGATIVE
Glucose, UA: NEGATIVE mg/dL
Ketones, ur: NEGATIVE mg/dL
Nitrite: NEGATIVE
PH: 6 (ref 5.0–8.0)
Protein, ur: NEGATIVE mg/dL
SPECIFIC GRAVITY, URINE: 1.019 (ref 1.005–1.030)

## 2018-10-15 LAB — COMPREHENSIVE METABOLIC PANEL
ALT: 15 U/L (ref 0–44)
AST: 19 U/L (ref 15–41)
Albumin: 4 g/dL (ref 3.5–5.0)
Alkaline Phosphatase: 85 U/L (ref 38–126)
Anion gap: 10 (ref 5–15)
BUN: 15 mg/dL (ref 8–23)
CHLORIDE: 103 mmol/L (ref 98–111)
CO2: 23 mmol/L (ref 22–32)
Calcium: 8.9 mg/dL (ref 8.9–10.3)
Creatinine, Ser: 1.03 mg/dL — ABNORMAL HIGH (ref 0.44–1.00)
GFR calc Af Amer: >60 mL/min (ref >60)
GFR calc non Af Amer: 55 mL/min — ABNORMAL LOW (ref >60)
Glucose, Bld: 108 mg/dL — ABNORMAL HIGH (ref 70–99)
Potassium: 3.5 mmol/L (ref 3.5–5.1)
SODIUM: 136 mmol/L (ref 135–145)
Total Bilirubin: 0.5 mg/dL (ref 0.3–1.2)
Total Protein: 7 g/dL (ref 6.5–8.1)

## 2018-10-15 LAB — LIPASE, BLOOD: LIPASE: 18 U/L (ref 11–51)

## 2018-10-15 LAB — CBC WITH DIFFERENTIAL/PLATELET
ABS IMMATURE GRANULOCYTES: 0.04 10*3/uL (ref 0.00–0.07)
Basophils Absolute: 0 10*3/uL (ref 0.0–0.1)
Basophils Relative: 0 %
Eosinophils Absolute: 0.1 10*3/uL (ref 0.0–0.5)
Eosinophils Relative: 0 %
HCT: 38.7 % (ref 36.0–46.0)
HEMOGLOBIN: 12.7 g/dL (ref 12.0–15.0)
Immature Granulocytes: 0 %
LYMPHS PCT: 8 %
Lymphs Abs: 1.2 10*3/uL (ref 0.7–4.0)
MCH: 30.6 pg (ref 26.0–34.0)
MCHC: 32.8 g/dL (ref 30.0–36.0)
MCV: 93.3 fL (ref 80.0–100.0)
MONO ABS: 1 10*3/uL (ref 0.1–1.0)
MONOS PCT: 7 %
NEUTROS ABS: 12.5 10*3/uL — AB (ref 1.7–7.7)
Neutrophils Relative %: 85 %
Platelets: 268 10*3/uL (ref 150–400)
RBC: 4.15 MIL/uL (ref 3.87–5.11)
RDW: 13.2 % (ref 11.5–15.5)
WBC: 14.7 10*3/uL — AB (ref 4.0–10.5)
nRBC: 0 % (ref 0.0–0.2)

## 2018-10-15 LAB — POC OCCULT BLOOD, ED: Fecal Occult Bld: POSITIVE — AB

## 2018-10-15 LAB — PROTIME-INR
INR: 1.1 (ref 0.8–1.2)
Prothrombin Time: 13.7 seconds (ref 11.4–15.2)

## 2018-10-15 LAB — TROPONIN I

## 2018-10-15 MED ORDER — ACETAMINOPHEN 325 MG PO TABS
650.0000 mg | ORAL_TABLET | Freq: Four times a day (QID) | ORAL | Status: DC | PRN
  Administered 2018-10-16: 650 mg via ORAL
  Filled 2018-10-15: qty 2

## 2018-10-15 MED ORDER — TIMOLOL MALEATE 0.5 % OP SOLN
1.0000 [drp] | Freq: Every day | OPHTHALMIC | Status: DC
  Administered 2018-10-15 – 2018-10-17 (×3): 1 [drp] via OPHTHALMIC
  Filled 2018-10-15: qty 5

## 2018-10-15 MED ORDER — ALPRAZOLAM 0.5 MG PO TABS
0.5000 mg | ORAL_TABLET | Freq: Two times a day (BID) | ORAL | Status: DC | PRN
  Administered 2018-10-16 – 2018-10-17 (×3): 0.5 mg via ORAL
  Filled 2018-10-15 (×3): qty 1

## 2018-10-15 MED ORDER — METRONIDAZOLE IN NACL 5-0.79 MG/ML-% IV SOLN
500.0000 mg | Freq: Once | INTRAVENOUS | Status: AC
  Administered 2018-10-15: 500 mg via INTRAVENOUS
  Filled 2018-10-15: qty 100

## 2018-10-15 MED ORDER — FAMOTIDINE IN NACL 20-0.9 MG/50ML-% IV SOLN
20.0000 mg | Freq: Once | INTRAVENOUS | Status: AC
  Administered 2018-10-15: 20 mg via INTRAVENOUS
  Filled 2018-10-15: qty 50

## 2018-10-15 MED ORDER — BUTALBITAL-APAP-CAFFEINE 50-325-40 MG PO TABS: 1.0000 | ORAL_TABLET | Freq: Two times a day (BID) | ORAL | Status: DC | PRN

## 2018-10-15 MED ORDER — SIMVASTATIN 20 MG PO TABS
20.0000 mg | ORAL_TABLET | Freq: Every day | ORAL | Status: DC
  Administered 2018-10-15 – 2018-10-16 (×2): 20 mg via ORAL
  Filled 2018-10-15 (×2): qty 1

## 2018-10-15 MED ORDER — POTASSIUM CHLORIDE IN NACL 20-0.9 MEQ/L-% IV SOLN
INTRAVENOUS | Status: DC
  Administered 2018-10-16 – 2018-10-17 (×2): via INTRAVENOUS

## 2018-10-15 MED ORDER — METRONIDAZOLE IN NACL 5-0.79 MG/ML-% IV SOLN
500.0000 mg | Freq: Three times a day (TID) | INTRAVENOUS | Status: DC
  Administered 2018-10-15 – 2018-10-16 (×3): 500 mg via INTRAVENOUS
  Filled 2018-10-15 (×3): qty 100

## 2018-10-15 MED ORDER — ACETAMINOPHEN 650 MG RE SUPP: 650.0000 mg | Freq: Four times a day (QID) | RECTAL | Status: DC | PRN

## 2018-10-15 MED ORDER — VERAPAMIL HCL ER 180 MG PO TBCR
180.0000 mg | EXTENDED_RELEASE_TABLET | Freq: Every day | ORAL | Status: DC
  Administered 2018-10-16: 180 mg via ORAL
  Filled 2018-10-15 (×2): qty 1

## 2018-10-15 MED ORDER — ONDANSETRON HCL 4 MG/2ML IJ SOLN
4.0000 mg | Freq: Four times a day (QID) | INTRAMUSCULAR | Status: DC | PRN
  Administered 2018-10-15: 4 mg via INTRAVENOUS
  Filled 2018-10-15: qty 2

## 2018-10-15 MED ORDER — ALBUTEROL SULFATE (2.5 MG/3ML) 0.083% IN NEBU: 3.0000 mL | INHALATION_SOLUTION | Freq: Four times a day (QID) | RESPIRATORY_TRACT | Status: DC | PRN

## 2018-10-15 MED ORDER — ONDANSETRON HCL 4 MG/2ML IJ SOLN
4.0000 mg | INTRAMUSCULAR | Status: AC | PRN
  Administered 2018-10-15 – 2018-10-16 (×2): 4 mg via INTRAVENOUS
  Filled 2018-10-15 (×2): qty 2

## 2018-10-15 MED ORDER — ISOSORBIDE MONONITRATE ER 60 MG PO TB24
30.0000 mg | ORAL_TABLET | Freq: Every day | ORAL | Status: DC
  Administered 2018-10-15 – 2018-10-17 (×3): 30 mg via ORAL
  Filled 2018-10-15 (×3): qty 1

## 2018-10-15 MED ORDER — ONDANSETRON HCL 4 MG PO TABS: 4.0000 mg | ORAL_TABLET | Freq: Four times a day (QID) | ORAL | Status: DC | PRN

## 2018-10-15 MED ORDER — CIPROFLOXACIN IN D5W 400 MG/200ML IV SOLN
400.0000 mg | Freq: Two times a day (BID) | INTRAVENOUS | Status: DC
  Administered 2018-10-15 – 2018-10-16 (×3): 400 mg via INTRAVENOUS
  Filled 2018-10-15 (×3): qty 200

## 2018-10-15 MED ORDER — POTASSIUM CHLORIDE IN NACL 20-0.9 MEQ/L-% IV SOLN
INTRAVENOUS | Status: AC
  Administered 2018-10-15 – 2018-10-16 (×2): via INTRAVENOUS

## 2018-10-15 MED ORDER — IOHEXOL 300 MG/ML  SOLN
100.0000 mL | Freq: Once | INTRAMUSCULAR | Status: AC | PRN
  Administered 2018-10-15: 100 mL via INTRAVENOUS

## 2018-10-15 MED ORDER — CIPROFLOXACIN IN D5W 400 MG/200ML IV SOLN
400.0000 mg | Freq: Once | INTRAVENOUS | Status: AC
  Administered 2018-10-15: 400 mg via INTRAVENOUS
  Filled 2018-10-15: qty 200

## 2018-10-15 NOTE — H&P (Signed)
History and Physical  Hannah Reynolds VXB:[id number] DOB: [birth year] DOA: 10/15/2018   PCP: Celene Squibb, MD   Patient coming from: Home  Chief Complaint: diarrhea, abd pain  HPI:  Hannah Reynolds is a 73 y.o. female with medical history of coronary artery disease, diverticulosis, hypertension, hyperlipidemia, anxiety presenting with 1 day history of diarrhea and abdominal pain.  The patient was in her usual state of health until 10/14/2018 when she began having watery bowel movements.  Subsequently, she noted a pinkish color in her stool in the evening of 3-20.  She denies any recent travels or eating any unusual or undercooked foods.  She denies any sick contacts.  She has had abdominal cramping with the diarrhea but denies any rashes, dysuria, chest pain, shortness breath, coughing, hemoptysis.  She has not been started on any new medications except for short course of prednisone for "poison oak" approximately 1 month prior to this admission.  She has not been on any antibiotics recently.  She has not had any vomiting but has some nausea.  Notably, the patient stated that she had a colonoscopy approximately 2 to 3 years prior to this admission for GI bleeding.  The patient was found to have diverticulosis at that time.  It was noted that her bleeding at that time was likely due to NSAIDs. In the emergency department, the patient was afebrile hemodynamically stable saturating 98% on room air.  BMP and LFTs were unremarkable.  Lipase was 18.  WBC was 14.7.  CT of the abdomen and pelvis showed mural colonic wall thickening most severe at the splenic flexure and descending colon with inflammatory changes with mucosal hyperenhancement.  There was sigmoid diverticulosis without any focal inflammation.  There was a thickened endometrium with enhancing soft tissue in the endometrial cavity.  The patient was started on Cipro and Flagyl.  Assessment/Plan: Acute colitis -Stool for C. Difficile -Stool pathogen  panel -Continue Cipro and Flagyl -IV fluids  Hematochezia -Likely due to the patient's colitis as well as recent manipulation of the patient's anal fissure on her rectal examination -Hemoglobin stable -Monitor CBC  Pyuria -Obtain urine culture  Essential hypertension -Holding lisinopril and Maxide due to soft blood pressure -Continue verapamil  Coronary artery disease -No chest pain presently -Holding aspirin temporarily -Continue Imdur and Zocor -EKG without concerning ischemic changes  Hyperlipidemia -Continue statin  Anxiety -Continue home dose alprazolam  Endometrial wall thickening -Incidental finding on CT abdomen and pelvis -No vaginal bleeding -Outpatient GYN follow-up       Past Medical History:  Diagnosis Date  . Anxiety   . CAD (coronary artery disease)   . Diverticulosis   . GERD (gastroesophageal reflux disease)   . H/O: GI bleed   . Hemorrhoids   . Hyperlipidemia   . Hypertension   . Nephrolithiasis   . Unspecified glaucoma(365.9)    Past Surgical History:  Procedure Laterality Date  . ABDOMINAL EXPLORATION SURGERY     ileus  . COLONOSCOPY N/A 11/20/2014   Procedure: COLONOSCOPY;  Surgeon: Rogene Houston, MD;  Location: [facility];  Service: Endoscopy;  Laterality: N/A;  730 with pediatric scope  . hemicolection in 1999 during exploratory surgery     . HERNIA REPAIR    . INCISIONAL HERNIA REPAIR    . VENTRAL HERNIA REPAIR     Social History:  reports that she has never smoked. She has never used smokeless tobacco. She reports that she does not drink alcohol or  use drugs.   Family History  Problem Relation Age of Onset  . Heart disease Mother        MI 12s, nonsmoker  . Heart disease Father        MI 83, nonsmoker  . Heart disease Brother        MI 27, nonsmoker     No Known Allergies   Prior to Admission medications   Medication Sig Start Date End Date Taking? Authorizing Provider  ALPRAZolam (XANAX) 0.5 MG tablet TAKE  (1) TABLET BY MOUTH TWICE DAILY AS NEEDED FOR ANXIETY. 07/27/14   Marin Olp, MD  aspirin EC 81 MG tablet Take 81 mg by mouth daily.    [provider]  Butalbital-APAP-Caffeine 50-300-40 MG CAPS Take 1 capsule by mouth 2 (two) times daily as needed (migraine headache). 07/22/14   Marin Olp, MD  isosorbide mononitrate (IMDUR) 30 MG 24 hr tablet Take 1 tablet (30 mg total) by mouth daily. 07/22/14   Marin Olp, MD  lisinopril (PRINIVIL,ZESTRIL) 20 MG tablet TAKE ONE TABLET BY MOUTH ONCE DAILY. 09/06/13   Ricard Dillon, MD  naproxen (NAPROSYN) 500 MG tablet TAKE (1) TABLET BY MOUTH TWICE A DAY AFTER MEALS. (BREAKFAST AND SUPPER). 05/24/18   Carole Civil, MD  simvastatin (ZOCOR) 20 MG tablet Take 1 tablet (20 mg total) by mouth daily at 6 PM. 07/08/14   Marin Olp, MD  timolol (BETIMOL) 0.5 % ophthalmic solution Place 1 drop into both eyes daily.     [provider]  triamterene-hydrochlorothiazide (MAXZIDE-25) 37.5-25 MG tablet TAKE ONE TABLET DAILY AS DIRECTED. Patient taking differently: TAKE ONE TABLET DAILY 09/07/15   Marin Olp, MD  VENTOLIN HFA 108 [phone] Base) MCG/ACT inhaler Inhale 1-2 puffs into the lungs every 6 (six) hours as needed for wheezing or shortness of breath.  01/01/18   [provider]  verapamil (CALAN-SR) 180 MG CR tablet Take 180 mg by mouth at bedtime.    [provider]    Review of Systems:  Constitutional:  No weight loss, night sweats, Fevers, chills, fatigue.  Head&Eyes: No headache.  No vision loss.  No eye pain or scotoma ENT:  No Difficulty swallowing,Tooth/dental problems,Sore throat,  No ear ache, post nasal drip,  Cardio-vascular:  No chest pain, Orthopnea, PND, swelling in lower extremities,  dizziness, palpitations  GI:  No vomiting melena, heartburn, indigestion, Resp:  No shortness of breath with exertion or at rest. No cough. No coughing up of blood .No wheezing.No chest wall  deformity  Skin:  no rash or lesions.  GU:  no dysuria, change in color of urine, no urgency or frequency. No flank pain.  Musculoskeletal:  No joint pain or swelling. No decreased range of motion. No back pain.  Psych:  No change in mood or affect. No depression or anxiety. Neurologic: No headache, no dysesthesia, no focal weakness, no vision loss. No syncope  Physical Exam: Vitals:   10/15/18 0827 10/15/18 1100  BP: (!) 132/59 102/76  Pulse: 96 86  Resp: 18 17  Temp: 98.2 F (36.8 C)   TempSrc: Oral   SpO2: 98% 94%  Weight: 89.8 kg   Height: 5\' 6"  (1.676 m)    General:  A&O x 3, NAD, nontoxic, pleasant/cooperative Head/Eye: No conjunctival hemorrhage, no icterus, [place]/AT, No nystagmus ENT:  No icterus,  No thrush, good dentition, no pharyngeal exudate Neck:  No masses, no lymphadenpathy, no bruits CV:  RRR, no rub, no gallop, no  S3 Lung:  CTAB, good air movement, no wheeze, no rhonchi Abdomen: soft/peribumbilical, +BS, nondistended, no peritoneal signs Ext: No cyanosis, No rashes, No petechiae, No lymphangitis, No edema Neuro: CNII-XII intact, strength 4/5 in bilateral upper and lower extremities, no dysmetria  Labs on Admission:  Basic Metabolic Panel: Recent Labs  Lab 10/15/18 0932  NA 136  K 3.5  CL 103  CO2 23  GLUCOSE 108*  BUN 15  CREATININE 1.03*  CALCIUM 8.9   Liver Function Tests: Recent Labs  Lab 10/15/18 0932  AST 19  ALT 15  ALKPHOS 85  BILITOT 0.5  PROT 7.0  ALBUMIN 4.0   Recent Labs  Lab 10/15/18 0927  LIPASE 18   No results for input(s): AMMONIA in the last 168 hours. CBC: Recent Labs  Lab 10/15/18 0927  WBC 14.7*  NEUTROABS 12.5*  HGB 12.7  HCT 38.7  MCV 93.3  PLT 268   Coagulation Profile: Recent Labs  Lab 10/15/18 0927  INR 1.1   Cardiac Enzymes: Recent Labs  Lab 10/15/18 0927  TROPONINI <0.03   BNP: Invalid input(s): POCBNP CBG: No results for input(s): GLUCAP in the last 168 hours. Urine analysis:      Component Value Date/Time   COLORURINE YELLOW 10/15/2018 0927   APPEARANCEUR CLOUDY (A) 10/15/2018 0927   LABSPEC 1.019 10/15/2018 0927   PHURINE 6.0 10/15/2018 0927   GLUCOSEU NEGATIVE 10/15/2018 0927   HGBUR SMALL (A) 10/15/2018 0927   BILIRUBINUR NEGATIVE 10/15/2018 0927   KETONESUR NEGATIVE 10/15/2018 0927   PROTEINUR NEGATIVE 10/15/2018 0927   NITRITE NEGATIVE 10/15/2018 0927   LEUKOCYTESUR SMALL (A) 10/15/2018 0927   Sepsis Labs: @LABRCNTIP (procalcitonin:4,lacticidven:4) )No results found for this or any previous visit (from the past 240 hour(s)).   Radiological Exams on Admission: Dg Chest 2 View  Result Date: 10/15/2018 CLINICAL DATA:  Nausea and vomiting. EXAM: CHEST - 2 VIEW COMPARISON:  Chest x-ray dated February 04, 2018. FINDINGS: The heart size and mediastinal contours are within normal limits. Both lungs are clear. The visualized skeletal structures are unremarkable. IMPRESSION: No active cardiopulmonary disease. Electronically Signed   By: Titus Dubin M.D.   On: 10/15/2018 10:50   Ct Abdomen Pelvis W Contrast  Result Date: 10/15/2018 CLINICAL DATA:  73 year old female with history of diarrhea since yesterday evening with small amount of blood in stool. Three episodes of vomiting. EXAM: CT ABDOMEN AND PELVIS WITH CONTRAST TECHNIQUE: Multidetector CT imaging of the abdomen and pelvis was performed using the standard protocol following bolus administration of intravenous contrast. CONTRAST:  162mL OMNIPAQUE IOHEXOL 300 MG/ML  SOLN COMPARISON:  CT the abdomen and pelvis 05/19/2012. FINDINGS: Lower chest: Scattered areas of mild scarring are again noted throughout the lung bases bilaterally, similar to the prior study. Atherosclerotic calcifications in the descending thoracic aorta. Hepatobiliary: No suspicious cystic or solid hepatic lesions. No intra or extrahepatic biliary ductal dilatation. Small calcified gallstones lying dependently in the gallbladder. No findings to suggest  an acute cholecystitis at this time. Pancreas: In the proximal body of the pancreas there is a 1.7 x 1.4 cm low-attenuation lesion (axial image 31 of series 2) which has increased slightly in size compared to remote prior study from 05/19/2012 (previously 12 x 14 mm), considered stable in size per current imaging guidelines. No other new suspicious appearing pancreatic mass. No pancreatic ductal dilatation. No peripancreatic fluid collections or inflammatory changes. Spleen: Unremarkable. Adrenals/Urinary Tract: Bilateral kidneys and bilateral adrenal glands are normal in appearance. No hydroureteronephrosis. Urinary bladder is normal in  appearance. Stomach/Bowel: Normal appearance of the stomach. No pathologic dilatation of small bowel or colon. Extensive mural thickening in the colon, most severe in the region of the splenic flexure and descending colon where there are surrounding inflammatory changes and there is some mucosal hyperenhancement. In addition, there are several colonic diverticulae are noted, particularly in the sigmoid colon, without definite focal surrounding inflammatory changes. Status post right hemicolectomy. Vascular/Lymphatic: Aortic atherosclerosis, without evidence of aneurysm or dissection in the abdominal or pelvic vasculature. Circumaortic left renal vein (normal anatomical variant) incidentally noted. No lymphadenopathy identified in the abdomen or pelvis. Reproductive: Endometrium appears thickened measuring up to 21 mm, and there is irregularity and apparent enhancing soft tissue in the endometrial cavity. Ovaries are atrophic. Other: No significant volume of ascites.  No pneumoperitoneum. Musculoskeletal: There are no aggressive appearing lytic or blastic lesions noted in the visualized portions of the skeleton. IMPRESSION: 1. Profound mural thickening, mucosal hyperenhancement and surrounding inflammatory changes in the colon, most evident in the splenic flexure and descending  colon, likely to reflect an acute colitis. 2. There are several colonic diverticulae, most evident in the sigmoid colon, without definite focal surrounding inflammatory changes to clearly indicate an acute diverticulitis. 3. Stable cystic lesion in the proximal body of the pancreas when compared to prior study from 2013. This is strongly favored to be benign. At this time, 6 and a half years of stability has been established. Repeat pancreatic protocol CT or abdominal MRI with and without IV gadolinium with MRCP is recommended in 2 years to ensure continued stability. This recommendation follows ACR consensus guidelines: Management of Incidental Pancreatic Cysts: A White Paper of the ACR Incidental Findings Committee. [place] 3159;[id number]. 4. Cholelithiasis without evidence of acute cholecystitis at this time. 5. Aortic atherosclerosis. Electronically Signed   By: Vinnie Langton M.D.   On: 10/15/2018 10:44    EKG: Independently reviewed. Sinus, no STT changes    Time spent:60 minutes Code Status:   FULL Family Communication:  Spouse updated at bedside Disposition Plan: expect 2-3 day hospitalization Consults called: none DVT Prophylaxis:    SCDs  Orson Eva, DO  [facility] Pager [phone]  If 7PM-7AM, please contact night-coverage [email or web address] Password [facility] 10/15/2018, 12:22 PM

## 2018-10-15 NOTE — ED Provider Notes (Signed)
[facility] EMERGENCY DEPARTMENT Provider Note   CSN: [id number] Arrival date & time: 10/15/18  9326    History   Chief Complaint Chief Complaint  Patient presents with  . GI Problem    HPI ARLEN LEGENDRE is a 73 y.o. female.     HPI  Pt was seen at 0910. Per pt, c/o gradual onset and persistence of multiple intermittent episodes of N/V/D for the past 2 days. Pt states she was passing "very very large BM's." Pt states now she sees "pink blood" on the stool and on toilet paper today after BM. Has been associated with generalized abd "pains." Pt states she was on prednisone 1 month ago and is concerned that is the cause of her symptoms. Denies CP/palpitations, no SOB/cough, no back pain, no black or blood in emesis, no black stools, no fevers, no rash.    Past Medical History:  Diagnosis Date  . Anxiety   . CAD (coronary artery disease)   . Diverticulosis   . GERD (gastroesophageal reflux disease)   . H/O: GI bleed   . Hemorrhoids   . Hyperlipidemia   . Hypertension   . Nephrolithiasis   . Unspecified glaucoma(365.9)     Patient Active Problem List   Diagnosis Date Noted  . Osteoarthritis 06/23/2014  . Migraine 06/23/2014  . Knee pain 03/27/2014  . Muscle weakness (generalized) 03/27/2014  . Meniscal injury 03/27/2014  . Contact dermatitis 04/13/2009  . DEPENDENT EDEMA, LEGS 03/27/2007  . SYMPTOM, SWELLING, ABDOMINAL, GENERALIZED 03/27/2007  . Hyperlipidemia 03/14/2007  . Generalized anxiety disorder 03/14/2007  . Glaucoma 03/14/2007  . Essential hypertension 03/14/2007  . NEPHROLITHIASIS, HX OF 03/14/2007    Past Surgical History:  Procedure Laterality Date  . ABDOMINAL EXPLORATION SURGERY     ileus  . COLONOSCOPY N/A 11/20/2014   Procedure: COLONOSCOPY;  Surgeon: Rogene Houston, MD;  Location: [facility];  Service: Endoscopy;  Laterality: N/A;  730 with pediatric scope  . hemicolection in 1999 during exploratory surgery     . HERNIA REPAIR    .  INCISIONAL HERNIA REPAIR    . VENTRAL HERNIA REPAIR       OB History   No obstetric history on file.      Home Medications    Prior to Admission medications   Medication Sig Start Date End Date Taking? Authorizing Provider  ALPRAZolam (XANAX) 0.5 MG tablet TAKE (1) TABLET BY MOUTH TWICE DAILY AS NEEDED FOR ANXIETY. 07/27/14   Marin Olp, MD  aspirin EC 81 MG tablet Take 81 mg by mouth daily.    [provider]  Butalbital-APAP-Caffeine 50-300-40 MG CAPS Take 1 capsule by mouth 2 (two) times daily as needed (migraine headache). 07/22/14   Marin Olp, MD  isosorbide mononitrate (IMDUR) 30 MG 24 hr tablet Take 1 tablet (30 mg total) by mouth daily. 07/22/14   Marin Olp, MD  lisinopril (PRINIVIL,ZESTRIL) 20 MG tablet TAKE ONE TABLET BY MOUTH ONCE DAILY. 09/06/13   Ricard Dillon, MD  naproxen (NAPROSYN) 500 MG tablet TAKE (1) TABLET BY MOUTH TWICE A DAY AFTER MEALS. (BREAKFAST AND SUPPER). 05/24/18   Carole Civil, MD  simvastatin (ZOCOR) 20 MG tablet Take 1 tablet (20 mg total) by mouth daily at 6 PM. 07/08/14   Marin Olp, MD  timolol (BETIMOL) 0.5 % ophthalmic solution Place 1 drop into both eyes daily.     [provider]  triamterene-hydrochlorothiazide (MAXZIDE-25) 37.5-25 MG tablet TAKE ONE TABLET DAILY  AS DIRECTED. Patient taking differently: TAKE ONE TABLET DAILY 09/07/15   Marin Olp, MD  VENTOLIN HFA [phone] Base) MCG/ACT inhaler Inhale 1-2 puffs into the lungs every 6 (six) hours as needed for wheezing or shortness of breath.  01/01/18   [provider]  verapamil (CALAN-SR) 180 MG CR tablet Take 180 mg by mouth at bedtime.    [provider]    Family History Family History  Problem Relation Age of Onset  . Heart disease Mother        MI 52s, nonsmoker  . Heart disease Father        MI 71, nonsmoker  . Heart disease Brother        MI 3, nonsmoker    Social History Social History   Tobacco Use   . Smoking status: Never Smoker  . Smokeless tobacco: Never Used  . Tobacco comment: second hand smoke exposure  Substance Use Topics  . Alcohol use: No    Alcohol/week: 0.0 standard drinks  . Drug use: No     Allergies   Patient has no known allergies.   Review of Systems Review of Systems ROS: Statement: All systems negative except as marked or noted in the HPI; Constitutional: Negative for fever and chills. ; ; Eyes: Negative for eye pain, redness and discharge. ; ; ENMT: Negative for ear pain, hoarseness, nasal congestion, sinus pressure and sore throat. ; ; Cardiovascular: Negative for chest pain, palpitations, diaphoresis, dyspnea and peripheral edema. ; ; Respiratory: Negative for cough, wheezing and stridor. ; ; Gastrointestinal: Negative for hematemesis, jaundice and +N/V/D, abd pain, rectal bleeding. . ; ; Genitourinary: Negative for dysuria, flank pain and hematuria. ; ; Musculoskeletal: Negative for back pain and neck pain. Negative for swelling and trauma.; ; Skin: Negative for pruritus, rash, abrasions, blisters, bruising and skin lesion.; ; Neuro: Negative for headache, lightheadedness and neck stiffness. Negative for weakness, altered level of consciousness, altered mental status, extremity weakness, paresthesias, involuntary movement, seizure and syncope.       Physical Exam Updated Vital Signs BP (!) 132/59 (BP Location: Left Arm)   Pulse 96   Temp 98.2 F (36.8 C) (Oral)   Resp 18   Ht 5\' 6"  (1.676 m)   Wt 89.8 kg   SpO2 98%   BMI 31.96 kg/m     Patient Vitals for the past 24 hrs:  BP Temp Temp src Pulse Resp SpO2 Height Weight  10/15/18 1100 102/76 - - 86 17 94 % - -  10/15/18 0827 (!) 132/59 98.2 F (36.8 C) Oral 96 18 98 % 5\' 6"  (1.676 m) 89.8 kg     Physical Exam 0915; Physical examination:  Nursing notes reviewed; Vital signs and O2 SAT reviewed;  Constitutional: Well developed, Well nourished, Well hydrated, In no acute distress; Head:   Normocephalic, atraumatic; Eyes: EOMI, PERRL, No scleral icterus; ENMT: Mouth and pharynx normal, Mucous membranes moist; Neck: Supple, Full range of motion, No lymphadenopathy; Cardiovascular: Regular rate and rhythm, No gallop; Respiratory: Breath sounds clear & equal bilaterally, No wheezes.  Speaking full sentences with ease, Normal respiratory effort/excursion; Chest: Nontender, Movement normal; Abdomen: Soft, +mild diffuse tenderness to palp. No rebound or guarding. Nondistended, Normal bowel sounds. Rectal exam performed w/permission of pt and ED RN chaperone present.  Anal tone normal.  +rectal fissure. Non-tender, pink mucus in rectal vault, heme positive. +external hemorrhoids without thrombosis or bleeding, no palp masses.;;; Genitourinary: No CVA tenderness; Extremities: Peripheral pulses normal, No tenderness, No  edema, No calf edema or asymmetry.; Neuro: AA&Ox3, Major CN grossly intact.  Speech clear. No gross focal motor or sensory deficits in extremities.; Skin: Color normal, Warm, Dry.; Psych:  Anxious.    ED Treatments / Results  Labs (all labs ordered are listed, but only abnormal results are displayed)   EKG EKG Interpretation  Date/Time:  Tuesday October 15 2018 09:47:48 EST Ventricular Rate:  93 PR Interval:    QRS Duration: 92 QT Interval:  360 QTC Calculation: 448 R Axis:   67 Text Interpretation:  Sinus rhythm Low voltage, precordial leads Abnormal R-wave progression, early transition When compared with ECG of 02/04/2018 No significant change was found Confirmed by Francine Graven [phone]) on 10/15/2018 9:51:45 AM   Radiology   Procedures Procedures (including critical care time)  Medications Ordered in ED Medications  ondansetron (ZOFRAN) injection 4 mg (has no administration in time range)  famotidine (PEPCID) IVPB 20 mg premix (has no administration in time range)     Initial Impression / Assessment and Plan / ED Course  I have reviewed the triage vital  signs and the nursing notes.  Pertinent labs & imaging results that were available during my care of the patient were reviewed by me and considered in my medical decision making (see chart for details).     MDM Reviewed: previous chart, nursing note and vitals Reviewed previous: labs and ECG Interpretation: labs, ECG, x-ray and CT scan    Results for orders placed or performed during the hospital encounter of 10/15/18  Comprehensive metabolic panel  Result Value Ref Range   Sodium 136 135 - 145 mmol/L   Potassium 3.5 3.5 - 5.1 mmol/L   Chloride 103 98 - 111 mmol/L   CO2 23 22 - 32 mmol/L   Glucose, Bld 108 (H) 70 - 99 mg/dL   BUN 15 8 - 23 mg/dL   Creatinine, Ser 1.03 (H) 0.44 - 1.00 mg/dL   Calcium 8.9 8.9 - 10.3 mg/dL   Total Protein 7.0 6.5 - 8.1 g/dL   Albumin 4.0 3.5 - 5.0 g/dL   AST 19 15 - 41 U/L   ALT 15 0 - 44 U/L   Alkaline Phosphatase 85 38 - 126 U/L   Total Bilirubin 0.5 0.3 - 1.2 mg/dL   GFR calc non Af Amer 55 (L) >60 mL/min   GFR calc Af Amer >60 >60 mL/min   Anion gap 10 5 - 15  CBC with Differential  Result Value Ref Range   WBC 14.7 (H) 4.0 - 10.5 K/uL   RBC 4.15 3.87 - 5.11 MIL/uL   Hemoglobin 12.7 12.0 - 15.0 g/dL   HCT 38.7 36.0 - 46.0 %   MCV 93.3 80.0 - 100.0 fL   MCH 30.6 26.0 - 34.0 pg   MCHC 32.8 30.0 - 36.0 g/dL   RDW 13.2 11.5 - 15.5 %   Platelets 268 150 - 400 K/uL   nRBC 0.0 0.0 - 0.2 %   Neutrophils Relative % 85 %   Neutro Abs 12.5 (H) 1.7 - 7.7 K/uL   Lymphocytes Relative 8 %   Lymphs Abs 1.2 0.7 - 4.0 K/uL   Monocytes Relative 7 %   Monocytes Absolute 1.0 0.1 - 1.0 K/uL   Eosinophils Relative 0 %   Eosinophils Absolute 0.1 0.0 - 0.5 K/uL   Basophils Relative 0 %   Basophils Absolute 0.0 0.0 - 0.1 K/uL   Immature Granulocytes 0 %   Abs Immature Granulocytes 0.04 0.00 - 0.07 K/uL  Protime-INR  Result Value Ref Range   Prothrombin Time 13.7 11.4 - 15.2 seconds   INR 1.1 0.8 - 1.2  Lipase, blood  Result Value Ref Range    Lipase 18 11 - 51 U/L  Troponin I - Once  Result Value Ref Range   Troponin I <0.03 <0.03 ng/mL  Urinalysis, Routine w reflex microscopic  Result Value Ref Range   Color, Urine YELLOW YELLOW   APPearance CLOUDY (A) CLEAR   Specific Gravity, Urine 1.019 1.005 - 1.030   pH 6.0 5.0 - 8.0   Glucose, UA NEGATIVE NEGATIVE mg/dL   Hgb urine dipstick SMALL (A) NEGATIVE   Bilirubin Urine NEGATIVE NEGATIVE   Ketones, ur NEGATIVE NEGATIVE mg/dL   Protein, ur NEGATIVE NEGATIVE mg/dL   Nitrite NEGATIVE NEGATIVE   Leukocytes,Ua SMALL (A) NEGATIVE   RBC / HPF 6-10 0 - 5 RBC/hpf   WBC, UA 11-20 0 - 5 WBC/hpf   Bacteria, UA RARE (A) NONE SEEN   Squamous Epithelial / LPF 21-50 0 - 5   Mucus PRESENT    Hyaline Casts, UA PRESENT   POC occult blood, ED  Result Value Ref Range   Fecal Occult Bld POSITIVE (A) NEGATIVE   Dg Chest 2 View Result Date: 10/15/2018 CLINICAL DATA:  Nausea and vomiting. EXAM: CHEST - 2 VIEW COMPARISON:  Chest x-ray dated February 04, 2018. FINDINGS: The heart size and mediastinal contours are within normal limits. Both lungs are clear. The visualized skeletal structures are unremarkable. IMPRESSION: No active cardiopulmonary disease. Electronically Signed   By: Titus Dubin M.D.   On: 10/15/2018 10:50   Ct Abdomen Pelvis W Contrast Result Date: 10/15/2018 CLINICAL DATA:  73 year old female with history of diarrhea since yesterday evening with small amount of blood in stool. Three episodes of vomiting. EXAM: CT ABDOMEN AND PELVIS WITH CONTRAST TECHNIQUE: Multidetector CT imaging of the abdomen and pelvis was performed using the standard protocol following bolus administration of intravenous contrast. CONTRAST:  153mL OMNIPAQUE IOHEXOL 300 MG/ML  SOLN COMPARISON:  CT the abdomen and pelvis 05/19/2012. FINDINGS: Lower chest: Scattered areas of mild scarring are again noted throughout the lung bases bilaterally, similar to the prior study. Atherosclerotic calcifications in the descending  thoracic aorta. Hepatobiliary: No suspicious cystic or solid hepatic lesions. No intra or extrahepatic biliary ductal dilatation. Small calcified gallstones lying dependently in the gallbladder. No findings to suggest an acute cholecystitis at this time. Pancreas: In the proximal body of the pancreas there is a 1.7 x 1.4 cm low-attenuation lesion (axial image 31 of series 2) which has increased slightly in size compared to remote prior study from 05/19/2012 (previously 12 x 14 mm), considered stable in size per current imaging guidelines. No other new suspicious appearing pancreatic mass. No pancreatic ductal dilatation. No peripancreatic fluid collections or inflammatory changes. Spleen: Unremarkable. Adrenals/Urinary Tract: Bilateral kidneys and bilateral adrenal glands are normal in appearance. No hydroureteronephrosis. Urinary bladder is normal in appearance. Stomach/Bowel: Normal appearance of the stomach. No pathologic dilatation of small bowel or colon. Extensive mural thickening in the colon, most severe in the region of the splenic flexure and descending colon where there are surrounding inflammatory changes and there is some mucosal hyperenhancement. In addition, there are several colonic diverticulae are noted, particularly in the sigmoid colon, without definite focal surrounding inflammatory changes. Status post right hemicolectomy. Vascular/Lymphatic: Aortic atherosclerosis, without evidence of aneurysm or dissection in the abdominal or pelvic vasculature. Circumaortic left renal vein (normal anatomical variant) incidentally noted. No lymphadenopathy identified  in the abdomen or pelvis. Reproductive: Endometrium appears thickened measuring up to 21 mm, and there is irregularity and apparent enhancing soft tissue in the endometrial cavity. Ovaries are atrophic. Other: No significant volume of ascites.  No pneumoperitoneum. Musculoskeletal: There are no aggressive appearing lytic or blastic lesions noted  in the visualized portions of the skeleton. IMPRESSION: 1. Profound mural thickening, mucosal hyperenhancement and surrounding inflammatory changes in the colon, most evident in the splenic flexure and descending colon, likely to reflect an acute colitis. 2. There are several colonic diverticulae, most evident in the sigmoid colon, without definite focal surrounding inflammatory changes to clearly indicate an acute diverticulitis. 3. Stable cystic lesion in the proximal body of the pancreas when compared to prior study from 2013. This is strongly favored to be benign. At this time, 6 and a half years of stability has been established. Repeat pancreatic protocol CT or abdominal MRI with and without IV gadolinium with MRCP is recommended in 2 years to ensure continued stability. This recommendation follows ACR consensus guidelines: Management of Incidental Pancreatic Cysts: A White Paper of the ACR Incidental Findings Committee. Portal 0017;49:449-675. 4. Cholelithiasis without evidence of acute cholecystitis at this time. 5. Aortic atherosclerosis. Electronically Signed   By: Vinnie Langton M.D.   On: 10/15/2018 10:44    1155:  CT as above; IV abx started. +anal fissure on exam. Dx and testing d/w pt and family.  Questions answered.  Verb understanding, agreeable to admit.  T/C returned from [facility] Dr. Carles Collet, case discussed, including:  HPI, pertinent PM/SHx, VS/PE, dx testing, ED course and treatment:  Agreeable to admit.     Final Clinical Impressions(s) / ED Diagnoses   Final diagnoses:  None    ED Discharge Orders    None       Francine Graven, DO 10/19/18 1747

## 2018-10-15 NOTE — ED Triage Notes (Signed)
Was put on prednisone approx 1 month ago.  Reports she vomited x3 last night and diarrhea. Seen some pink in stool this morning.

## 2018-10-16 DIAGNOSIS — R112 Nausea with vomiting, unspecified: Secondary | ICD-10-CM

## 2018-10-16 DIAGNOSIS — R197 Diarrhea, unspecified: Secondary | ICD-10-CM

## 2018-10-16 LAB — URINE CULTURE

## 2018-10-16 LAB — BASIC METABOLIC PANEL
ANION GAP: 7 (ref 5–15)
BUN: 9 mg/dL (ref 8–23)
CO2: 23 mmol/L (ref 22–32)
Calcium: 8 mg/dL — ABNORMAL LOW (ref 8.9–10.3)
Chloride: 107 mmol/L (ref 98–111)
Creatinine, Ser: 0.94 mg/dL (ref 0.44–1.00)
GFR calc Af Amer: >60 mL/min (ref >60)
GFR calc non Af Amer: >60 mL/min (ref >60)
Glucose, Bld: 101 mg/dL — ABNORMAL HIGH (ref 70–99)
Potassium: 3.3 mmol/L — ABNORMAL LOW (ref 3.5–5.1)
Sodium: 137 mmol/L (ref 135–145)

## 2018-10-16 LAB — CBC
HCT: 32.2 % — ABNORMAL LOW (ref 36.0–46.0)
HEMOGLOBIN: 10.1 g/dL — AB (ref 12.0–15.0)
MCH: 29.7 pg (ref 26.0–34.0)
MCHC: 31.4 g/dL (ref 30.0–36.0)
MCV: 94.7 fL (ref 80.0–100.0)
Platelets: 206 10*3/uL (ref 150–400)
RBC: 3.4 MIL/uL — AB (ref 3.87–5.11)
RDW: 13.4 % (ref 11.5–15.5)
WBC: 10.4 10*3/uL (ref 4.0–10.5)
nRBC: 0 % (ref 0.0–0.2)

## 2018-10-16 LAB — HIV ANTIBODY (ROUTINE TESTING W REFLEX): HIV Screen 4th Generation wRfx: NONREACTIVE

## 2018-10-16 MED ORDER — METRONIDAZOLE 500 MG PO TABS
500.0000 mg | ORAL_TABLET | Freq: Three times a day (TID) | ORAL | Status: DC
  Administered 2018-10-16 – 2018-10-17 (×3): 500 mg via ORAL
  Filled 2018-10-16 (×3): qty 1

## 2018-10-16 MED ORDER — CIPROFLOXACIN HCL 250 MG PO TABS
500.0000 mg | ORAL_TABLET | Freq: Two times a day (BID) | ORAL | Status: DC
  Administered 2018-10-16 – 2018-10-17 (×3): 500 mg via ORAL
  Filled 2018-10-16 (×3): qty 2

## 2018-10-16 NOTE — Progress Notes (Signed)
PROGRESS NOTE    Hannah Reynolds  BZJ:[id number] DOB: [birth year] DOA: 10/15/2018 PCP: Celene Squibb, MD     Brief Narrative:  73 y.o. female with medical history of coronary artery disease, diverticulosis, hypertension, hyperlipidemia, anxiety presenting with 1 day history of diarrhea and abdominal pain.  The patient was in her usual state of health until 10/14/2018 when she began having watery bowel movements.  Subsequently, she noted a pinkish color in her stool in the evening of 3-20.  She denies any recent travels or eating any unusual or undercooked foods.  She denies any sick contacts.  She has had abdominal cramping with the diarrhea but denies any rashes, dysuria, chest pain, shortness breath, coughing, hemoptysis.  She has not been started on any new medications except for short course of prednisone for "poison oak" approximately 1 month prior to this admission.  She has not been on any antibiotics recently.  She has not had any vomiting but has some nausea.  Notably, the patient stated that she had a colonoscopy approximately 2 to 3 years prior to this admission for GI bleeding.  The patient was found to have diverticulosis at that time.  It was noted that her bleeding at that time was likely due to NSAIDs. In the emergency department, the patient was afebrile hemodynamically stable saturating 98% on room air.  BMP and LFTs were unremarkable.  Lipase was 18.  WBC was 14.7.  CT of the abdomen and pelvis showed mural colonic wall thickening most severe at the splenic flexure and descending colon with inflammatory changes with mucosal hyperenhancement.  There was sigmoid diverticulosis without any focal inflammation.  There was a thickened endometrium with enhancing soft tissue in the endometrial cavity.  The patient was started on Cipro and Flagyl   Assessment & Plan: 1-acute colitis -no further diarrhea, patient also denying nause and vomiting -no fever -doubt C. Diff -continue cipro and flagyl,  but transition to PO -advance diet -encourage to maintain hydration -will need repeat colonoscopy in 6-8 weeks.  2-hematochezia In the setting of colitis -no further bleeding appreciated -Hgb stable -continue treatment for colitis -outpatient colonoscopy once acute process resolved  3-Hyperlipidemia -continue statins   4-pyuria -patient denies dysuria -cipro will provide empiric coverage -follow urine culture  5-Essential hypertension -continue verapamil and imdur -will continue lisinopril and Maxzide for now  6-hx of CAD -no CP and no SOB -will cont holding ASA for now, due to acute hematochezia -continue imdur and zocor.    7-anxiety -continue PRN alprazolam   8-endometrial wall thickening  -incidental finding on CT abd/pelvis -no vaginal bleeding or red flag symptoms as pre patient description. -outpatient follow up with GYN recommended.  DVT prophylaxis: SCD's Code Status: Full code Family Communication: husband at bedside  Disposition Plan: hopefully home in the next 24-48 hours. Advance diet, transition abx's to PO and follow electrolytes trend.  Consultants:   None   Procedures:   See below for x-ray reports.  Antimicrobials:  Anti-infectives (From admission, onward)   Start     Dose/Rate Route Frequency Ordered Stop   10/16/18 1400  metroNIDAZOLE (FLAGYL) tablet 500 mg     500 mg Oral Every 8 hours 10/16/18 1338     10/16/18 1345  ciprofloxacin (CIPRO) tablet 500 mg     500 mg Oral 2 times daily 10/16/18 1338     10/15/18 1615  ciprofloxacin (CIPRO) IVPB 400 mg  Status:  Discontinued     400 mg 200 mL/hr over 60  Minutes Intravenous Every 12 hours 10/15/18 1600 10/16/18 1338   10/15/18 1615  metroNIDAZOLE (FLAGYL) IVPB 500 mg  Status:  Discontinued     500 mg 100 mL/hr over 60 Minutes Intravenous Every 8 hours 10/15/18 1600 10/16/18 1338   10/15/18 1145  ciprofloxacin (CIPRO) IVPB 400 mg     400 mg 200 mL/hr over 60 Minutes Intravenous  Once  10/15/18 1130 10/15/18 1245   10/15/18 1145  metroNIDAZOLE (FLAGYL) IVPB 500 mg     500 mg 100 mL/hr over 60 Minutes Intravenous  Once 10/15/18 1130 10/15/18 1539       Subjective: Afebrile, no CP, o nausea, no vomiting. Feeling better overall and will like to have diet advanced. No further diarrhea since admission.   Objective: Vitals:   10/15/18 2127 10/15/18 2141 10/16/18 0433 10/16/18 1440  BP: (!) 86/46 (!) 106/58 (!) 117/50 (!) 108/56  Pulse: 72  70 63  Resp: 20  20 16   Temp: 98.8 F (37.1 C)  98.3 F (36.8 C)   TempSrc: Oral  Oral   SpO2: 94%  94% 99%  Weight:      Height:        Intake/Output Summary (Last 24 hours) at 10/16/2018 2155 Last data filed at 10/16/2018 1700 Gross per 24 hour  Intake 1552.41 ml  Output -  Net 1552.41 ml   Filed Weights   10/15/18 0827 10/15/18 1601  Weight: 89.8 kg 92.4 kg    Examination: General exam: Alert, awake, oriented x 3; afebrile, reports feeling better and denies N/V or further diarrhea. Respiratory system: Clear to auscultation. Respiratory effort normal. Cardiovascular system:RRR. No murmurs, rubs, gallops. Gastrointestinal system: Abdomen is nondistended, soft and just mild tender on deep palpation of her LLQ. No organomegaly or masses felt. Normal bowel sounds heard. Central nervous system: Alert and oriented. No focal neurological deficits. Extremities: No C/C/E, +pedal pulses Skin: No rashes, lesions or ulcers Psychiatry: Judgement and insight appear normal. Mood & affect appropriate.    Data Reviewed: I have personally reviewed following labs and imaging studies  CBC: Recent Labs  Lab 10/15/18 0927 10/16/18 0448  WBC 14.7* 10.4  NEUTROABS 12.5*  --   HGB 12.7 10.1*  HCT 38.7 32.2*  MCV 93.3 94.7  PLT 268 865   Basic Metabolic Panel: Recent Labs  Lab 10/15/18 0932 10/16/18 0448  NA 136 137  K 3.5 3.3*  CL 103 107  CO2 23 23  GLUCOSE 108* 101*  BUN 15 9  CREATININE 1.03* 0.94  CALCIUM 8.9 8.0*    GFR: Estimated Creatinine Clearance: 63.7 mL/min (by C-G formula based on SCr of 0.94 mg/dL).   Liver Function Tests: Recent Labs  Lab 10/15/18 0932  AST 19  ALT 15  ALKPHOS 85  BILITOT 0.5  PROT 7.0  ALBUMIN 4.0   Recent Labs  Lab 10/15/18 0927  LIPASE 18   Coagulation Profile: Recent Labs  Lab 10/15/18 0927  INR 1.1   Cardiac Enzymes: Recent Labs  Lab 10/15/18 0927  TROPONINI <0.03   Urine analysis:    Component Value Date/Time   COLORURINE YELLOW 10/15/2018 0927   APPEARANCEUR CLOUDY (A) 10/15/2018 0927   LABSPEC 1.019 10/15/2018 0927   PHURINE 6.0 10/15/2018 0927   GLUCOSEU NEGATIVE 10/15/2018 0927   HGBUR SMALL (A) 10/15/2018 0927   BILIRUBINUR NEGATIVE 10/15/2018 [place] 10/15/2018 0927   PROTEINUR NEGATIVE 10/15/2018 0927   NITRITE NEGATIVE 10/15/2018 0927   LEUKOCYTESUR SMALL (A) 10/15/2018 7846  Recent Results (from the past 240 hour(s))  Culture, Urine     Status: Abnormal   Collection Time: 10/15/18  9:27 AM  Result Value Ref Range Status   Specimen Description   Final    URINE, CLEAN CATCH Performed at [facility], [address], [place], [place] [address]    Special Requests   Final    NONE Performed at [facility], [address], [place],  [address]    Culture MULTIPLE SPECIES PRESENT, SUGGEST RECOLLECTION (A)  Final   Report Status 10/16/2018 FINAL  Final     Radiology Studies: Dg Chest 2 View  Result Date: 10/15/2018 CLINICAL DATA:  Nausea and vomiting. EXAM: CHEST - 2 VIEW COMPARISON:  Chest x-ray dated February 04, 2018. FINDINGS: The heart size and mediastinal contours are within normal limits. Both lungs are clear. The visualized skeletal structures are unremarkable. IMPRESSION: No active cardiopulmonary disease. Electronically Signed   By: Titus Dubin M.D.   On: 10/15/2018 10:50   Ct Abdomen Pelvis W Contrast  Addendum Date: 10/16/2018   ADDENDUM REPORT: 10/16/2018 13:41 ADDENDUM: Although  mentioned in the body of the report, the presence of abnormal endometrial thickening and enhancing soft tissue was omitted from the original impression section. Accordingly, this was discussed by phone with Dr. Dyann Kief on 10/16/2018 at 1:35 p.m., and recommendation for nonemergent outpatient follow-up with OB-Gyn as well as further evaluation with nonemergent transvaginal ultrasound in the near future was made. Electronically Signed   By: Vinnie Langton M.D.   On: 10/16/2018 13:41   Result Date: 10/16/2018 CLINICAL DATA:  73 year old female with history of diarrhea since yesterday evening with small amount of blood in stool. Three episodes of vomiting. EXAM: CT ABDOMEN AND PELVIS WITH CONTRAST TECHNIQUE: Multidetector CT imaging of the abdomen and pelvis was performed using the standard protocol following bolus administration of intravenous contrast. CONTRAST:  131mL OMNIPAQUE IOHEXOL 300 MG/ML  SOLN COMPARISON:  CT the abdomen and pelvis 05/19/2012. FINDINGS: Lower chest: Scattered areas of mild scarring are again noted throughout the lung bases bilaterally, similar to the prior study. Atherosclerotic calcifications in the descending thoracic aorta. Hepatobiliary: No suspicious cystic or solid hepatic lesions. No intra or extrahepatic biliary ductal dilatation. Small calcified gallstones lying dependently in the gallbladder. No findings to suggest an acute cholecystitis at this time. Pancreas: In the proximal body of the pancreas there is a 1.7 x 1.4 cm low-attenuation lesion (axial image 31 of series 2) which has increased slightly in size compared to remote prior study from 05/19/2012 (previously 12 x 14 mm), considered stable in size per current imaging guidelines. No other new suspicious appearing pancreatic mass. No pancreatic ductal dilatation. No peripancreatic fluid collections or inflammatory changes. Spleen: Unremarkable. Adrenals/Urinary Tract: Bilateral kidneys and bilateral adrenal glands are normal  in appearance. No hydroureteronephrosis. Urinary bladder is normal in appearance. Stomach/Bowel: Normal appearance of the stomach. No pathologic dilatation of small bowel or colon. Extensive mural thickening in the colon, most severe in the region of the splenic flexure and descending colon where there are surrounding inflammatory changes and there is some mucosal hyperenhancement. In addition, there are several colonic diverticulae are noted, particularly in the sigmoid colon, without definite focal surrounding inflammatory changes. Status post right hemicolectomy. Vascular/Lymphatic: Aortic atherosclerosis, without evidence of aneurysm or dissection in the abdominal or pelvic vasculature. Circumaortic left renal vein (normal anatomical variant) incidentally noted. No lymphadenopathy identified in the abdomen or pelvis. Reproductive: Endometrium appears thickened measuring up to 21 mm, and there  is irregularity and apparent enhancing soft tissue in the endometrial cavity. Ovaries are atrophic. Other: No significant volume of ascites.  No pneumoperitoneum. Musculoskeletal: There are no aggressive appearing lytic or blastic lesions noted in the visualized portions of the skeleton. IMPRESSION: 1. Profound mural thickening, mucosal hyperenhancement and surrounding inflammatory changes in the colon, most evident in the splenic flexure and descending colon, likely to reflect an acute colitis. 2. There are several colonic diverticulae, most evident in the sigmoid colon, without definite focal surrounding inflammatory changes to clearly indicate an acute diverticulitis. 3. Stable cystic lesion in the proximal body of the pancreas when compared to prior study from 2013. This is strongly favored to be benign. At this time, 6 and a half years of stability has been established. Repeat pancreatic protocol CT or abdominal MRI with and without IV gadolinium with MRCP is recommended in 2 years to ensure continued stability. This  recommendation follows ACR consensus guidelines: Management of Incidental Pancreatic Cysts: A White Paper of the ACR Incidental Findings Committee. [place] [id number];[id number]. 4. Cholelithiasis without evidence of acute cholecystitis at this time. 5. Aortic atherosclerosis. Electronically Signed: By: Vinnie Langton M.D. On: 10/15/2018 10:44    Scheduled Meds: . ciprofloxacin  500 mg Oral BID  . isosorbide mononitrate  30 mg Oral Daily  . metroNIDAZOLE  500 mg Oral Q8H  . simvastatin  20 mg Oral q1800  . timolol  1 drop Both Eyes Daily  . verapamil  180 mg Oral QHS   Continuous Infusions: . 0.9 % NaCl with KCl 20 mEq / L 100 mL/hr at 10/16/18 1958     LOS: 1 day    Time spent: 30 minutes.    Barton Dubois, MD [facility] Pager [phone]   10/16/2018, 9:55 PM

## 2018-10-17 DIAGNOSIS — R112 Nausea with vomiting, unspecified: Secondary | ICD-10-CM

## 2018-10-17 DIAGNOSIS — E6609 Other obesity due to excess calories: Secondary | ICD-10-CM

## 2018-10-17 DIAGNOSIS — Z6832 Body mass index (BMI) 32.0-32.9, adult: Secondary | ICD-10-CM

## 2018-10-17 DIAGNOSIS — R197 Diarrhea, unspecified: Secondary | ICD-10-CM

## 2018-10-17 LAB — CBC
HCT: 31.5 % — ABNORMAL LOW (ref 36.0–46.0)
Hemoglobin: 9.7 g/dL — ABNORMAL LOW (ref 12.0–15.0)
MCH: 29.7 pg (ref 26.0–34.0)
MCHC: 30.8 g/dL (ref 30.0–36.0)
MCV: 96.3 fL (ref 80.0–100.0)
Platelets: 190 10*3/uL (ref 150–400)
RBC: 3.27 MIL/uL — AB (ref 3.87–5.11)
RDW: 13.2 % (ref 11.5–15.5)
WBC: 6.8 10*3/uL (ref 4.0–10.5)
nRBC: 0 % (ref 0.0–0.2)

## 2018-10-17 LAB — BASIC METABOLIC PANEL
Anion gap: 4 — ABNORMAL LOW (ref 5–15)
BUN: 7 mg/dL — ABNORMAL LOW (ref 8–23)
CO2: 23 mmol/L (ref 22–32)
Calcium: 7.9 mg/dL — ABNORMAL LOW (ref 8.9–10.3)
Chloride: 112 mmol/L — ABNORMAL HIGH (ref 98–111)
Creatinine, Ser: 0.92 mg/dL (ref 0.44–1.00)
GFR calc Af Amer: >60 mL/min (ref >60)
GFR calc non Af Amer: >60 mL/min (ref >60)
Glucose, Bld: 92 mg/dL (ref 70–99)
Potassium: 3.7 mmol/L (ref 3.5–5.1)
Sodium: 139 mmol/L (ref 135–145)

## 2018-10-17 MED ORDER — METRONIDAZOLE 500 MG PO TABS: 500.0000 mg | ORAL_TABLET | Freq: Three times a day (TID) | ORAL | 0 refills | Status: AC

## 2018-10-17 MED ORDER — CIPROFLOXACIN HCL 500 MG PO TABS: 500.0000 mg | ORAL_TABLET | Freq: Two times a day (BID) | ORAL | 0 refills | Status: AC

## 2018-10-17 NOTE — Discharge Summary (Signed)
Physician Discharge Summary  Hannah Reynolds QIW:[id number] DOB: [birth year] DOA: 10/15/2018  PCP: Celene Squibb, MD  Admit date: 10/15/2018 Discharge date: 10/17/2018  Time spent: 35 minutes  Recommendations for Outpatient Follow-up:  1. Repeat basic metabolic panel to follow electrolytes and renal function 2. Reassess blood pressure and further adjust antihypertensive regimen as needed 3. Patient will need outpatient follow-up with gynecologist to follow on endometrial thickening appreciated on her CT abdomen and pelvis during this admission. 4. Outpatient follow-up with gastroenterology service to further decide on timing for colonoscopy.   Discharge Diagnoses:  Active Problems:   Hyperlipidemia   Essential hypertension   Acute colitis   Hematochezia   Nausea vomiting and diarrhea   Class 1 obesity due to excess calories with body mass index (BMI) of 32.0 to 32.9 in adult   Discharge Condition: Stable and improved.  Patient has been discharged home with instructions to follow-up with PCP in 10 days.  Diet recommendation: Low residue/heart healthy.  Filed Weights   10/15/18 0827 10/15/18 1601  Weight: 89.8 kg 92.4 kg    History of present illness:  As per H&P written by Dr. Shanon Brow Tat on 10/15/2018  72 y.o.femalewith medical history ofcoronary artery disease, diverticulosis, hypertension, hyperlipidemia, anxiety presenting with 1 day history of diarrhea and abdominal pain. The patient was in her usual state of health until 10/14/2018 when she began having watery bowel movements. Subsequently, she noted a pinkish color in her stool in the evening of 3-20. She denies any recent travels or eating any unusual or undercooked foods. She denies any sick contacts. She has had abdominal cramping with the diarrhea but denies any rashes, dysuria, chest pain, shortness breath, coughing, hemoptysis. She has not been started on any new medications except for short course of prednisone for "poison  oak" approximately 1 month prior to this admission. She has not been on any antibiotics recently. She has not had any vomiting but has some nausea. Notably, the patient stated that she had a colonoscopy approximately 2 to 3 years prior to this admission for GI bleeding. The patient was found to have diverticulosis at that time. It was noted that her bleeding at that time was likely due to NSAIDs. In the emergency department, the patient was afebrile hemodynamically stable saturating 98% on room air. BMP and LFTs were unremarkable. Lipase was 18. WBC was 14.7. CT of the abdomen and pelvis showed mural colonic wall thickening most severe at the splenic flexure and descending colon with inflammatory changes with mucosal hyperenhancement. There was sigmoid diverticulosis without any focal inflammation. There was a thickened endometrium with enhancing soft tissue in the endometrial cavity.The patient was started on Cipro and Flagyl.   Hospital Course:  1-acute colitis -no fever, nausea, vomiting, abdominal pain no further episode of diarrhea. -continue cipro and flagyl by mouth to complete a total of 10 days therapy. -Diet advanced to low residue heart healthy and well tolerated prior to discharge. -encourage to maintain adequate hydration -will most likely need repeat colonoscopy in 6-8 weeks.  2-hematochezia In the setting of colitis -no further bleeding appreciated -Hgb stable -continue treatment for colitis -outpatient colonoscopy once acute process resolved, to be determined by GI service.  3-Hyperlipidemia -continue statins   4-pyuria -patient denies dysuria -cipro will provide empiric coverage -follow urine culture final results  5-Essential hypertension -Stable and improved. -Blood pressure rising -Will resume home antihypertensive regimen -Heart healthy diet encouraged.  6-hx of CAD -no CP and no SOB -Safe to resume aspirin  as previously taken. -continue  imdur and zocor.    7-anxiety -continue PRN alprazolam   8-endometrial wall thickening  -incidental finding on CT abd/pelvis -no vaginal bleeding or red flag symptoms as per patient description. -outpatient follow up with GYN recommended. -Patient might benefit of vaginal ultrasound for further evaluation and treatment determination.  9-class I obesity -Low calorie diet, portion control and increase physical activity discussed with patient -Body mass index is 32.89 kg/m.   Procedures:  See below for x-ray reports.  Consultations:  None   Discharge Exam: Vitals:   10/16/18 2300 10/17/18 0537  BP: (!) 121/48 (!) 122/57  Pulse: 65 68  Resp: 20 18  Temp: 98 F (36.7 C) 98.2 F (36.8 C)  SpO2: 95% 94%    General: Afebrile, no further nausea, no vomiting, no abdominal pain currently.  Patient tolerating diet and medications by mouth. Cardiovascular: S1 and S2, no rubs, no gallops, no JVD. Respiratory: Bilaterally; normal respiratory effort. Abdomen: Soft, currently nontender, positive bowel sounds. Extremities: No edema, no cyanosis, no clubbing.  Discharge Instructions   Discharge Instructions    Diet - low sodium heart healthy   Complete by:  As directed    Discharge instructions   Complete by:  As directed    Keep yourself well-hydrated Take medications as prescribed Arrange follow-up with PCP in 10 days Remember to follow a low residue diet and to use stool softener as needed.   Increase activity slowly   Complete by:  As directed      Allergies as of 10/17/2018   No Known Allergies     Medication List    STOP taking these medications   ibuprofen 200 MG tablet Commonly known as:  ADVIL,MOTRIN     TAKE these medications   ALPRAZolam 0.5 MG tablet Commonly known as:  XANAX TAKE (1) TABLET BY MOUTH TWICE DAILY AS NEEDED FOR ANXIETY.   aspirin EC 81 MG tablet Take 81 mg by mouth daily.   Butalbital-APAP-Caffeine 50-300-40 MG Caps Take 1 capsule  by mouth 2 (two) times daily as needed (migraine headache).   ciprofloxacin 500 MG tablet Commonly known as:  CIPRO Take 1 tablet (500 mg total) by mouth 2 (two) times daily for 8 days.   isosorbide mononitrate 30 MG 24 hr tablet Commonly known as:  IMDUR Take 1 tablet (30 mg total) by mouth daily.   lisinopril 20 MG tablet Commonly known as:  PRINIVIL,ZESTRIL TAKE ONE TABLET BY MOUTH ONCE DAILY.   metroNIDAZOLE 500 MG tablet Commonly known as:  FLAGYL Take 1 tablet (500 mg total) by mouth every 8 (eight) hours for 8 days.   naproxen 500 MG tablet Commonly known as:  NAPROSYN TAKE (1) TABLET BY MOUTH TWICE A DAY AFTER MEALS. (BREAKFAST AND SUPPER).   simvastatin 20 MG tablet Commonly known as:  ZOCOR Take 1 tablet (20 mg total) by mouth daily at 6 PM.   timolol 0.5 % ophthalmic solution Commonly known as:  BETIMOL Place 1 drop into both eyes daily.   triamterene-hydrochlorothiazide 37.5-25 MG tablet Commonly known as:  MAXZIDE-25 TAKE ONE TABLET DAILY AS DIRECTED. What changed:  See the new instructions.   VENTOLIN HFA 108 (90 Base) MCG/ACT inhaler Generic drug:  albuterol Inhale 1-2 puffs into the lungs every 6 (six) hours as needed for wheezing or shortness of breath.   verapamil 180 MG CR tablet Commonly known as:  CALAN-SR Take 180 mg by mouth at bedtime.      No Known Allergies Follow-up  Information    Celene Squibb, MD. Schedule an appointment as soon as possible for a visit in 10 day(s).   Specialty:  Internal Medicine Contact information: [place] [address] [phone]           The results of significant diagnostics from this hospitalization (including imaging, microbiology, ancillary and laboratory) are listed below for reference.    Significant Diagnostic Studies: Dg Chest 2 View  Result Date: 10/15/2018 CLINICAL DATA:  Nausea and vomiting. EXAM: CHEST - 2 VIEW COMPARISON:  Chest x-ray dated February 04, 2018. FINDINGS: The  heart size and mediastinal contours are within normal limits. Both lungs are clear. The visualized skeletal structures are unremarkable. IMPRESSION: No active cardiopulmonary disease. Electronically Signed   By: Titus Dubin M.D.   On: 10/15/2018 10:50   Ct Abdomen Pelvis W Contrast  Addendum Date: 10/16/2018   ADDENDUM REPORT: 10/16/2018 13:41 ADDENDUM: Although mentioned in the body of the report, the presence of abnormal endometrial thickening and enhancing soft tissue was omitted from the original impression section. Accordingly, this was discussed by phone with Dr. Dyann Kief on 10/16/2018 at 1:35 p.m., and recommendation for nonemergent outpatient follow-up with OB-Gyn as well as further evaluation with nonemergent transvaginal ultrasound in the near future was made. Electronically Signed   By: Vinnie Langton M.D.   On: 10/16/2018 13:41   Result Date: 10/16/2018 CLINICAL DATA:  73 year old female with history of diarrhea since yesterday evening with small amount of blood in stool. Three episodes of vomiting. EXAM: CT ABDOMEN AND PELVIS WITH CONTRAST TECHNIQUE: Multidetector CT imaging of the abdomen and pelvis was performed using the standard protocol following bolus administration of intravenous contrast. CONTRAST:  184mL OMNIPAQUE IOHEXOL 300 MG/ML  SOLN COMPARISON:  CT the abdomen and pelvis 05/19/2012. FINDINGS: Lower chest: Scattered areas of mild scarring are again noted throughout the lung bases bilaterally, similar to the prior study. Atherosclerotic calcifications in the descending thoracic aorta. Hepatobiliary: No suspicious cystic or solid hepatic lesions. No intra or extrahepatic biliary ductal dilatation. Small calcified gallstones lying dependently in the gallbladder. No findings to suggest an acute cholecystitis at this time. Pancreas: In the proximal body of the pancreas there is a 1.7 x 1.4 cm low-attenuation lesion (axial image 31 of series 2) which has increased slightly in size  compared to remote prior study from 05/19/2012 (previously 12 x 14 mm), considered stable in size per current imaging guidelines. No other new suspicious appearing pancreatic mass. No pancreatic ductal dilatation. No peripancreatic fluid collections or inflammatory changes. Spleen: Unremarkable. Adrenals/Urinary Tract: Bilateral kidneys and bilateral adrenal glands are normal in appearance. No hydroureteronephrosis. Urinary bladder is normal in appearance. Stomach/Bowel: Normal appearance of the stomach. No pathologic dilatation of small bowel or colon. Extensive mural thickening in the colon, most severe in the region of the splenic flexure and descending colon where there are surrounding inflammatory changes and there is some mucosal hyperenhancement. In addition, there are several colonic diverticulae are noted, particularly in the sigmoid colon, without definite focal surrounding inflammatory changes. Status post right hemicolectomy. Vascular/Lymphatic: Aortic atherosclerosis, without evidence of aneurysm or dissection in the abdominal or pelvic vasculature. Circumaortic left renal vein (normal anatomical variant) incidentally noted. No lymphadenopathy identified in the abdomen or pelvis. Reproductive: Endometrium appears thickened measuring up to 21 mm, and there is irregularity and apparent enhancing soft tissue in the endometrial cavity. Ovaries are atrophic. Other: No significant volume of ascites.  No pneumoperitoneum. Musculoskeletal: There are no aggressive appearing lytic  or blastic lesions noted in the visualized portions of the skeleton. IMPRESSION: 1. Profound mural thickening, mucosal hyperenhancement and surrounding inflammatory changes in the colon, most evident in the splenic flexure and descending colon, likely to reflect an acute colitis. 2. There are several colonic diverticulae, most evident in the sigmoid colon, without definite focal surrounding inflammatory changes to clearly indicate an  acute diverticulitis. 3. Stable cystic lesion in the proximal body of the pancreas when compared to prior study from 2013. This is strongly favored to be benign. At this time, 6 and a half years of stability has been established. Repeat pancreatic protocol CT or abdominal MRI with and without IV gadolinium with MRCP is recommended in 2 years to ensure continued stability. This recommendation follows ACR consensus guidelines: Management of Incidental Pancreatic Cysts: A White Paper of the ACR Incidental Findings Committee. Stephen 1007;12:197-588. 4. Cholelithiasis without evidence of acute cholecystitis at this time. 5. Aortic atherosclerosis. Electronically Signed: By: Vinnie Langton M.D. On: 10/15/2018 10:44    Microbiology: Recent Results (from the past 240 hour(s))  Culture, Urine     Status: Abnormal   Collection Time: 10/15/18  9:27 AM  Result Value Ref Range Status   Specimen Description   Final    URINE, CLEAN CATCH Performed at [facility], [address] [place], [place] [address]    Special Requests   Final    NONE Performed at [facility], [address], [place],  [address]    Culture MULTIPLE SPECIES PRESENT, SUGGEST RECOLLECTION (A)  Final   Report Status 10/16/2018 FINAL  Final     Labs: Basic Metabolic Panel: Recent Labs  Lab 10/15/18 0932 10/16/18 0448 10/17/18 0438  NA 136 137 139  K 3.5 3.3* 3.7  CL 103 107 112*  CO2 23 23 23   GLUCOSE 108* 101* 92  BUN 15 9 7*  CREATININE 1.03* 0.94 0.92  CALCIUM 8.9 8.0* 7.9*   Liver Function Tests: Recent Labs  Lab 10/15/18 0932  AST 19  ALT 15  ALKPHOS 85  BILITOT 0.5  PROT 7.0  ALBUMIN 4.0   Recent Labs  Lab 10/15/18 0927  LIPASE 18   CBC: Recent Labs  Lab 10/15/18 0927 10/16/18 0448 10/17/18 0438  WBC 14.7* 10.4 6.8  NEUTROABS 12.5*  --   --   HGB 12.7 10.1* 9.7*  HCT 38.7 32.2* 31.5*  MCV 93.3 94.7 96.3  PLT 268 206 190   Cardiac Enzymes: Recent Labs  Lab 10/15/18 5830   TROPONINI <0.03   Signed:  Barton Dubois MD.  [facility] 10/17/2018, 11:48 AM

## 2018-10-18 DIAGNOSIS — I10 Essential (primary) hypertension: Secondary | ICD-10-CM | POA: Diagnosis not present

## 2018-10-18 DIAGNOSIS — K921 Melena: Secondary | ICD-10-CM | POA: Diagnosis not present

## 2018-10-18 DIAGNOSIS — E785 Hyperlipidemia, unspecified: Secondary | ICD-10-CM | POA: Diagnosis not present

## 2018-10-18 DIAGNOSIS — K529 Noninfective gastroenteritis and colitis, unspecified: Secondary | ICD-10-CM | POA: Diagnosis not present

## 2018-10-22 ENCOUNTER — Emergency Department (HOSPITAL_COMMUNITY): Payer: Medicare Other

## 2018-10-22 ENCOUNTER — Inpatient Hospital Stay (HOSPITAL_COMMUNITY)
Admission: EM | Admit: 2018-10-22 | Discharge: 2018-10-23 | DRG: 206 | Disposition: A | Discharge status: Home / Self Care | Payer: Medicare Other | Attending: Family Medicine | Admitting: Family Medicine

## 2018-10-22 ENCOUNTER — Other Ambulatory Visit: Payer: Self-pay

## 2018-10-22 ENCOUNTER — Encounter (HOSPITAL_COMMUNITY): Payer: Self-pay | Admitting: Emergency Medicine

## 2018-10-22 DIAGNOSIS — Z6832 Body mass index (BMI) 32.0-32.9, adult: Secondary | ICD-10-CM

## 2018-10-22 DIAGNOSIS — Z8249 Family history of ischemic heart disease and other diseases of the circulatory system: Secondary | ICD-10-CM | POA: Diagnosis not present

## 2018-10-22 DIAGNOSIS — Z7722 Contact with and (suspected) exposure to environmental tobacco smoke (acute) (chronic): Secondary | ICD-10-CM | POA: Diagnosis present

## 2018-10-22 DIAGNOSIS — Z7982 Long term (current) use of aspirin: Secondary | ICD-10-CM | POA: Diagnosis not present

## 2018-10-22 DIAGNOSIS — R079 Chest pain, unspecified: Secondary | ICD-10-CM | POA: Diagnosis not present

## 2018-10-22 DIAGNOSIS — R9389 Abnormal findings on diagnostic imaging of other specified body structures: Secondary | ICD-10-CM | POA: Diagnosis present

## 2018-10-22 DIAGNOSIS — I10 Essential (primary) hypertension: Secondary | ICD-10-CM | POA: Diagnosis present

## 2018-10-22 DIAGNOSIS — E785 Hyperlipidemia, unspecified: Secondary | ICD-10-CM | POA: Diagnosis present

## 2018-10-22 DIAGNOSIS — F411 Generalized anxiety disorder: Secondary | ICD-10-CM | POA: Diagnosis present

## 2018-10-22 DIAGNOSIS — Z791 Long term (current) use of non-steroidal anti-inflammatories (NSAID): Secondary | ICD-10-CM

## 2018-10-22 DIAGNOSIS — K8020 Calculus of gallbladder without cholecystitis without obstruction: Secondary | ICD-10-CM | POA: Diagnosis not present

## 2018-10-22 DIAGNOSIS — Z79899 Other long term (current) drug therapy: Secondary | ICD-10-CM | POA: Diagnosis not present

## 2018-10-22 DIAGNOSIS — E669 Obesity, unspecified: Secondary | ICD-10-CM | POA: Diagnosis not present

## 2018-10-22 DIAGNOSIS — K529 Noninfective gastroenteritis and colitis, unspecified: Secondary | ICD-10-CM | POA: Diagnosis not present

## 2018-10-22 DIAGNOSIS — R0689 Other abnormalities of breathing: Secondary | ICD-10-CM | POA: Diagnosis not present

## 2018-10-22 DIAGNOSIS — R0682 Tachypnea, not elsewhere classified: Secondary | ICD-10-CM | POA: Diagnosis not present

## 2018-10-22 DIAGNOSIS — K219 Gastro-esophageal reflux disease without esophagitis: Secondary | ICD-10-CM | POA: Diagnosis not present

## 2018-10-22 DIAGNOSIS — H409 Unspecified glaucoma: Secondary | ICD-10-CM | POA: Diagnosis not present

## 2018-10-22 DIAGNOSIS — Z87442 Personal history of urinary calculi: Secondary | ICD-10-CM | POA: Diagnosis not present

## 2018-10-22 DIAGNOSIS — I2510 Atherosclerotic heart disease of native coronary artery without angina pectoris: Secondary | ICD-10-CM | POA: Diagnosis not present

## 2018-10-22 DIAGNOSIS — R457 State of emotional shock and stress, unspecified: Secondary | ICD-10-CM | POA: Diagnosis not present

## 2018-10-22 DIAGNOSIS — I959 Hypotension, unspecified: Secondary | ICD-10-CM | POA: Diagnosis not present

## 2018-10-22 DIAGNOSIS — R509 Fever, unspecified: Secondary | ICD-10-CM | POA: Diagnosis not present

## 2018-10-22 DIAGNOSIS — J9811 Atelectasis: Secondary | ICD-10-CM | POA: Diagnosis not present

## 2018-10-22 DIAGNOSIS — J986 Disorders of diaphragm: Secondary | ICD-10-CM | POA: Diagnosis not present

## 2018-10-22 DIAGNOSIS — R0600 Dyspnea, unspecified: Secondary | ICD-10-CM | POA: Diagnosis not present

## 2018-10-22 DIAGNOSIS — R0602 Shortness of breath: Secondary | ICD-10-CM | POA: Diagnosis not present

## 2018-10-22 LAB — LACTIC ACID, PLASMA
LACTIC ACID, VENOUS: 1.8 mmol/L (ref 0.5–1.9)
Lactic Acid, Venous: 2.1 mmol/L (ref 0.5–1.9)

## 2018-10-22 LAB — CBC WITH DIFFERENTIAL/PLATELET
Abs Immature Granulocytes: 0.04 10*3/uL (ref 0.00–0.07)
BASOS PCT: 1 %
Basophils Absolute: 0 10*3/uL (ref 0.0–0.1)
Eosinophils Absolute: 0.3 10*3/uL (ref 0.0–0.5)
Eosinophils Relative: 4 %
HCT: 41.7 % (ref 36.0–46.0)
Hemoglobin: 13.4 g/dL (ref 12.0–15.0)
Immature Granulocytes: 1 %
Lymphocytes Relative: 14 %
Lymphs Abs: 1.1 10*3/uL (ref 0.7–4.0)
MCH: 30.3 pg (ref 26.0–34.0)
MCHC: 32.1 g/dL (ref 30.0–36.0)
MCV: 94.3 fL (ref 80.0–100.0)
Monocytes Absolute: 1 10*3/uL (ref 0.1–1.0)
Monocytes Relative: 12 %
NEUTROS ABS: 5.4 10*3/uL (ref 1.7–7.7)
Neutrophils Relative %: 68 %
Platelets: 404 10*3/uL — ABNORMAL HIGH (ref 150–400)
RBC: 4.42 MIL/uL (ref 3.87–5.11)
RDW: 13.6 % (ref 11.5–15.5)
WBC: 7.9 10*3/uL (ref 4.0–10.5)
nRBC: 0 % (ref 0.0–0.2)

## 2018-10-22 LAB — BASIC METABOLIC PANEL
Anion gap: 12 (ref 5–15)
BUN: 15 mg/dL (ref 8–23)
CO2: 22 mmol/L (ref 22–32)
Calcium: 9.3 mg/dL (ref 8.9–10.3)
Chloride: 103 mmol/L (ref 98–111)
Creatinine, Ser: 1.16 mg/dL — ABNORMAL HIGH (ref 0.44–1.00)
GFR calc Af Amer: 55 mL/min — ABNORMAL LOW (ref >60)
GFR calc non Af Amer: 48 mL/min — ABNORMAL LOW (ref >60)
Glucose, Bld: 118 mg/dL — ABNORMAL HIGH (ref 70–99)
Potassium: 3.3 mmol/L — ABNORMAL LOW (ref 3.5–5.1)
Sodium: 137 mmol/L (ref 135–145)

## 2018-10-22 LAB — INFLUENZA PANEL BY PCR (TYPE A & B)
Influenza A By PCR: NEGATIVE
Influenza B By PCR: NEGATIVE

## 2018-10-22 LAB — BRAIN NATRIURETIC PEPTIDE: B Natriuretic Peptide: 16 pg/mL (ref 0.0–100.0)

## 2018-10-22 LAB — TROPONIN I
Troponin I: <0.03 ng/mL (ref <0.03)
Troponin I: <0.03 ng/mL (ref <0.03)

## 2018-10-22 MED ORDER — METRONIDAZOLE IN NACL 5-0.79 MG/ML-% IV SOLN
500.0000 mg | Freq: Once | INTRAVENOUS | Status: AC
  Administered 2018-10-22: 500 mg via INTRAVENOUS
  Filled 2018-10-22: qty 100

## 2018-10-22 MED ORDER — SODIUM CHLORIDE 0.9 % IV SOLN
INTRAVENOUS | Status: DC
  Administered 2018-10-22: 23:00:00 via INTRAVENOUS

## 2018-10-22 MED ORDER — ISOSORBIDE MONONITRATE ER 60 MG PO TB24
30.0000 mg | ORAL_TABLET | Freq: Every morning | ORAL | Status: DC
  Administered 2018-10-23: 30 mg via ORAL
  Filled 2018-10-22: qty 1

## 2018-10-22 MED ORDER — ENOXAPARIN SODIUM 40 MG/0.4ML ~~LOC~~ SOLN
40.0000 mg | SUBCUTANEOUS | Status: DC
  Administered 2018-10-22: 40 mg via SUBCUTANEOUS
  Filled 2018-10-22: qty 0.4

## 2018-10-22 MED ORDER — SIMVASTATIN 20 MG PO TABS
20.0000 mg | ORAL_TABLET | Freq: Every day | ORAL | Status: DC
  Administered 2018-10-22: 20 mg via ORAL
  Filled 2018-10-22: qty 1

## 2018-10-22 MED ORDER — VANCOMYCIN HCL IN DEXTROSE 1-5 GM/200ML-% IV SOLN
1000.0000 mg | Freq: Once | INTRAVENOUS | Status: AC
  Administered 2018-10-22: 1000 mg via INTRAVENOUS
  Filled 2018-10-22: qty 200

## 2018-10-22 MED ORDER — ALBUTEROL SULFATE (2.5 MG/3ML) 0.083% IN NEBU: 3.0000 mL | INHALATION_SOLUTION | Freq: Four times a day (QID) | RESPIRATORY_TRACT | Status: DC | PRN

## 2018-10-22 MED ORDER — POTASSIUM CHLORIDE CRYS ER 20 MEQ PO TBCR
20.0000 meq | EXTENDED_RELEASE_TABLET | Freq: Once | ORAL | Status: AC
  Administered 2018-10-22: 20 meq via ORAL
  Filled 2018-10-22: qty 1

## 2018-10-22 MED ORDER — IOHEXOL 350 MG/ML SOLN
100.0000 mL | Freq: Once | INTRAVENOUS | Status: AC | PRN
  Administered 2018-10-22: 100 mL via INTRAVENOUS

## 2018-10-22 MED ORDER — ONDANSETRON HCL 4 MG/2ML IJ SOLN
4.0000 mg | Freq: Once | INTRAMUSCULAR | Status: AC
  Administered 2018-10-22: 4 mg via INTRAVENOUS
  Filled 2018-10-22: qty 2

## 2018-10-22 MED ORDER — TIMOLOL MALEATE 0.5 % OP SOLN: 1.0000 [drp] | Freq: Every day | OPHTHALMIC | Status: DC

## 2018-10-22 MED ORDER — SODIUM CHLORIDE 0.9 % IV BOLUS
500.0000 mL | Freq: Once | INTRAVENOUS | Status: AC
  Administered 2018-10-22: 500 mL via INTRAVENOUS

## 2018-10-22 MED ORDER — SODIUM CHLORIDE 0.9 % IV SOLN
2.0000 g | Freq: Two times a day (BID) | INTRAVENOUS | Status: DC
  Administered 2018-10-23 (×2): 2 g via INTRAVENOUS
  Filled 2018-10-22 (×2): qty 2

## 2018-10-22 MED ORDER — ASPIRIN EC 81 MG PO TBEC
81.0000 mg | DELAYED_RELEASE_TABLET | Freq: Every morning | ORAL | Status: DC
  Administered 2018-10-23: 81 mg via ORAL
  Filled 2018-10-22: qty 1

## 2018-10-22 MED ORDER — VANCOMYCIN HCL 10 G IV SOLR
1500.0000 mg | INTRAVENOUS | Status: DC
  Filled 2018-10-22: qty 1500

## 2018-10-22 MED ORDER — VERAPAMIL HCL ER 180 MG PO TBCR
180.0000 mg | EXTENDED_RELEASE_TABLET | Freq: Every day | ORAL | Status: DC
  Administered 2018-10-22: 180 mg via ORAL
  Filled 2018-10-22: qty 1

## 2018-10-22 MED ORDER — VANCOMYCIN HCL IN DEXTROSE 1-5 GM/200ML-% IV SOLN: 1000.0000 mg | Freq: Once | INTRAVENOUS | Status: DC

## 2018-10-22 MED ORDER — ALPRAZOLAM 0.5 MG PO TABS
0.5000 mg | ORAL_TABLET | Freq: Two times a day (BID) | ORAL | Status: DC
  Administered 2018-10-22 – 2018-10-23 (×2): 0.5 mg via ORAL
  Filled 2018-10-22 (×2): qty 1

## 2018-10-22 MED ORDER — SODIUM CHLORIDE 0.9 % IV SOLN
2.0000 g | Freq: Once | INTRAVENOUS | Status: AC
  Administered 2018-10-22: 2 g via INTRAVENOUS
  Filled 2018-10-22: qty 2

## 2018-10-22 NOTE — ED Notes (Signed)
Notified by CT that patient's IV extravasated during contrast administration. States patient did not receive total amount of contrast. States CT will write report.

## 2018-10-22 NOTE — H&P (Signed)
TRH H&P    Patient Demographics:    Hannah Reynolds, is a 73 y.o. female  MRN: [id number]  DOB - 1946/03/08  Admit Date - 10/22/2018  Referring MD/NP/PA: Dr. Laverta Baltimore  Outpatient Primary MD for the patient is Celene Squibb, MD  Patient coming from: Home  Chief complaint-cough   HPI:    Hannah Reynolds  is a 73 y.o. female, with a past medical history of CAD, hyperlipidemia, hypertension, GERD who was recently married for colitis and was discharged on 10/17/2018.  Patient says that on Saturday she started coughing and also developed shortness of breath.  This morning when she checked pulse ox her O2 sats were 89% on room air.Patient states that her GI symptoms have improved.  She denies nausea vomiting or diarrhea. She denies coughing up any phlegm.  Denies chest pain.  Denies abdominal pain.  In the ED, initial lactic acid was 2.1 patient was started on vancomycin and cefepime empirically. CTA was ordered but could not be done as IV infiltrated.  ED physician ordered CT chest and abdomen without contrast, due to elevated lactic acid but all the tests came back negative.  No previous history of stroke or seizures.  Denies passing out.  No dizziness.    Review of systems:    In addition to the HPI above,    All other systems reviewed and are negative.    Past History of the following :    Past Medical History:  Diagnosis Date  . Anxiety   . CAD (coronary artery disease)   . Diverticulosis   . GERD (gastroesophageal reflux disease)   . H/O: GI bleed   . Hemorrhoids   . Hyperlipidemia   . Hypertension   . Nephrolithiasis   . Unspecified glaucoma(365.9)       Past Surgical History:  Procedure Laterality Date  . ABDOMINAL EXPLORATION SURGERY     ileus  . COLONOSCOPY N/A 11/20/2014   Procedure: COLONOSCOPY;  Surgeon: Rogene Houston, MD;  Location: [facility];  Service: Endoscopy;  Laterality: N/A;  730  with pediatric scope  . hemicolection in 1999 during exploratory surgery     . HERNIA REPAIR    . INCISIONAL HERNIA REPAIR    . VENTRAL HERNIA REPAIR        Social History:      Social History   Tobacco Use  . Smoking status: Never Smoker  . Smokeless tobacco: Never Used  . Tobacco comment: second hand smoke exposure  Substance Use Topics  . Alcohol use: No    Alcohol/week: 0.0 standard drinks       Family History :     Family History  Problem Relation Age of Onset  . Heart disease Mother        MI 52s, nonsmoker  . Heart disease Father        MI 42, nonsmoker  . Heart disease Brother        MI 75, nonsmoker      Home Medications:   Prior to Admission medications  Medication Sig Start Date End Date Taking? Authorizing Provider  ALPRAZolam (XANAX) 0.5 MG tablet TAKE (1) TABLET BY MOUTH TWICE DAILY AS NEEDED FOR ANXIETY. Patient taking differently: Take 0.5 mg by mouth 2 (two) times daily.  07/27/14  Yes Marin Olp, MD  aspirin EC 81 MG tablet Take 81 mg by mouth every morning.    Yes [provider]  Butalbital-APAP-Caffeine 50-300-40 MG CAPS Take 1 capsule by mouth 2 (two) times daily as needed (migraine headache). 07/22/14  Yes Marin Olp, MD  ciprofloxacin (CIPRO) 500 MG tablet Take 1 tablet (500 mg total) by mouth 2 (two) times daily for 8 days. 10/17/18 10/25/18 Yes Barton Dubois, MD  isosorbide mononitrate (IMDUR) 30 MG 24 hr tablet Take 1 tablet (30 mg total) by mouth daily. Patient taking differently: Take 30 mg by mouth every morning.  07/22/14  Yes Marin Olp, MD  lisinopril (PRINIVIL,ZESTRIL) 20 MG tablet TAKE ONE TABLET BY MOUTH ONCE DAILY. Patient taking differently: Take 20 mg by mouth at bedtime.  09/06/13  Yes Ricard Dillon, MD  metroNIDAZOLE (FLAGYL) 500 MG tablet Take 1 tablet (500 mg total) by mouth every 8 (eight) hours for 8 days. 10/17/18 10/25/18 Yes Barton Dubois, MD  naproxen (NAPROSYN) 500 MG tablet TAKE (1) TABLET  BY MOUTH TWICE A DAY AFTER MEALS. (BREAKFAST AND SUPPER). Patient taking differently: Take 250 mg by mouth daily as needed for mild pain or moderate pain (knee pain).  05/24/18  Yes Carole Civil, MD  simvastatin (ZOCOR) 20 MG tablet Take 1 tablet (20 mg total) by mouth daily at 6 PM. 07/08/14  Yes Marin Olp, MD  timolol (BETIMOL) 0.5 % ophthalmic solution Place 1 drop into both eyes daily.    Yes [provider]  triamterene-hydrochlorothiazide (MAXZIDE-25) 37.5-25 MG tablet TAKE ONE TABLET DAILY AS DIRECTED. Patient taking differently: Take 1 tablet by mouth every morning.  09/07/15  Yes Marin Olp, MD  VENTOLIN HFA [phone] Base) MCG/ACT inhaler Inhale 1-2 puffs into the lungs every 6 (six) hours as needed for wheezing or shortness of breath.  01/01/18  Yes [provider]  verapamil (CALAN-SR) 180 MG CR tablet Take 180 mg by mouth at bedtime.   Yes [provider]     Allergies:    No Known Allergies   Physical Exam:   Vitals  Blood pressure 111/62, pulse 66, temperature 97.8 F (36.6 C), temperature source Oral, resp. rate 12, height 5\' 6"  (1.676 m), weight 88.9 kg, SpO2 97 %.  1.  General: Appears in no acute distress  2. Psychiatric: Alert, x3, intact insight and judgment  3. Neurologic: Cranial nerves II through XII grossly intact, motor strength 5/5 in all extremities  4. HEENMT:  Atraumatic normocephalic, extraocular muscles are intact  5. Respiratory : Clear to auscultation bilaterally, no wheezing or crackles auscultated  6. Cardiovascular : S1-S2, regular, no murmur auscultated  7. Gastrointestinal:  Abdomen is soft, nontender, no organomegaly     Data Review:    CBC Recent Labs  Lab 10/16/18 0448 10/17/18 0438 10/22/18 1230  WBC 10.4 6.8 7.9  HGB 10.1* 9.7* 13.4  HCT 32.2* 31.5* 41.7  PLT 206 190 404*  MCV 94.7 96.3 94.3  MCH 29.7 29.7 30.3  MCHC 31.4 30.8 32.1  RDW 13.4 13.2 13.6  LYMPHSABS  --    --  1.1  MONOABS  --   --  1.0  EOSABS  --   --  0.3  BASOSABS  --   --  0.0   ------------------------------------------------------------------------------------------------------------------  Results for orders placed or performed during the hospital encounter of 10/22/18 (from the past 48 hour(s))  CBC with Differential     Status: Abnormal   Collection Time: 10/22/18 12:30 PM  Result Value Ref Range   WBC 7.9 4.0 - 10.5 K/uL   RBC 4.42 3.87 - 5.11 MIL/uL   Hemoglobin 13.4 12.0 - 15.0 g/dL   HCT 41.7 36.0 - 46.0 %   MCV 94.3 80.0 - 100.0 fL   MCH 30.3 26.0 - 34.0 pg   MCHC 32.1 30.0 - 36.0 g/dL   RDW 13.6 11.5 - 15.5 %   Platelets 404 (H) 150 - 400 K/uL   nRBC 0.0 0.0 - 0.2 %   Neutrophils Relative % 68 %   Neutro Abs 5.4 1.7 - 7.7 K/uL   Lymphocytes Relative 14 %   Lymphs Abs 1.1 0.7 - 4.0 K/uL   Monocytes Relative 12 %   Monocytes Absolute 1.0 0.1 - 1.0 K/uL   Eosinophils Relative 4 %   Eosinophils Absolute 0.3 0.0 - 0.5 K/uL   Basophils Relative 1 %   Basophils Absolute 0.0 0.0 - 0.1 K/uL   Immature Granulocytes 1 %   Abs Immature Granulocytes 0.04 0.00 - 0.07 K/uL    Comment: Performed at [facility], [address] [place], [place] [address]  Basic metabolic panel     Status: Abnormal   Collection Time: 10/22/18 12:30 PM  Result Value Ref Range   Sodium 137 135 - 145 mmol/L   Potassium 3.3 (L) 3.5 - 5.1 mmol/L   Chloride 103 98 - 111 mmol/L   CO2 22 22 - 32 mmol/L   Glucose, Bld 118 (H) 70 - 99 mg/dL   BUN 15 8 - 23 mg/dL   Creatinine, Ser 1.16 (H) 0.44 - 1.00 mg/dL   Calcium 9.3 8.9 - 10.3 mg/dL   GFR calc non Af Amer 48 (L) >60 mL/min   GFR calc Af Amer 55 (L) >60 mL/min   Anion gap 12 5 - 15    Comment: Performed at [facility], [address] [place], [place] [address]  Troponin I - ONCE - STAT     Status: None   Collection Time: 10/22/18 12:30 PM  Result Value Ref Range   Troponin I <0.03 <0.03 ng/mL    Comment: Performed at [facility],  [address] [place], [place] [address]  Brain natriuretic peptide     Status: None   Collection Time: 10/22/18 12:30 PM  Result Value Ref Range   B Natriuretic Peptide 16.0 0.0 - 100.0 pg/mL    Comment: Performed at [facility], [address], [place], [place] [address]  Lactic acid, plasma     Status: Abnormal   Collection Time: 10/22/18 12:30 PM  Result Value Ref Range   Lactic Acid, Venous 2.1 (HH) 0.5 - 1.9 mmol/L    Comment: CRITICAL RESULT CALLED TO, READ BACK BY AND VERIFIED WITH: CARDWELL,L AT 1259 ON 3.10.20 BY ISLEY,B Performed at [facility], [address], [place], [place] [address]   Influenza panel by PCR (type A & B)     Status: None   Collection Time: 10/22/18 12:34 PM  Result Value Ref Range   Influenza A By PCR NEGATIVE NEGATIVE   Influenza B By PCR NEGATIVE NEGATIVE    Comment: (NOTE) The Xpert Xpress Flu assay is intended as an aid in the diagnosis of  influenza and should not be used as a sole  basis for treatment.  This  assay is FDA approved for nasopharyngeal swab specimens only. Nasal  washings and aspirates are unacceptable for Xpert Xpress Flu testing. Performed at [facility], [address] [place], [place] [address]   Lactic acid, plasma     Status: None   Collection Time: 10/22/18  3:25 PM  Result Value Ref Range   Lactic Acid, Venous 1.8 0.5 - 1.9 mmol/L    Comment: Performed at [facility], [address], [place], [place] [address]    Chemistries  Recent Labs  Lab 10/16/18 0448 10/17/18 0438 10/22/18 1230  NA 137 139 137  K 3.3* 3.7 3.3*  CL 107 112* 103  CO2 23 23 22   GLUCOSE 101* 92 118*  BUN 9 7* 15  CREATININE 0.94 0.92 1.16*  CALCIUM 8.0* 7.9* 9.3   ------------------------------------------------------------------------------------------------------------------  ------------------------------------------------------------------------------------------------------------------ GFR: Estimated Creatinine Clearance: 50.7 mL/min (A)  (by C-G formula based on SCr of 1.16 mg/dL (H)). Liver Function Tests: No results for input(s): AST, ALT, ALKPHOS, BILITOT, PROT, ALBUMIN in the last 168 hours. No results for input(s): LIPASE, AMYLASE in the last 168 hours. No results for input(s): AMMONIA in the last 168 hours. Coagulation Profile: No results for input(s): INR, PROTIME in the last 168 hours. Cardiac Enzymes: Recent Labs  Lab 10/22/18 1230  TROPONINI <0.03   BNP (last 3 results) No results for input(s): PROBNP in the last 8760 hours. HbA1C: No results for input(s): HGBA1C in the last 72 hours. CBG: No results for input(s): GLUCAP in the last 168 hours. Lipid Profile: No results for input(s): CHOL, HDL, LDLCALC, TRIG, CHOLHDL, LDLDIRECT in the last 72 hours. Thyroid Function Tests: No results for input(s): TSH, T4TOTAL, FREET4, T3FREE, THYROIDAB in the last 72 hours. Anemia Panel: No results for input(s): VITAMINB12, FOLATE, FERRITIN, TIBC, IRON, RETICCTPCT in the last 72 hours.  --------------------------------------------------------------------------------------------------------------- Urine analysis:    Component Value Date/Time   COLORURINE YELLOW 10/15/2018 0927   APPEARANCEUR CLOUDY (A) 10/15/2018 0927   LABSPEC 1.019 10/15/2018 0927   PHURINE 6.0 10/15/2018 0927   GLUCOSEU NEGATIVE 10/15/2018 0927   HGBUR SMALL (A) 10/15/2018 0927   BILIRUBINUR NEGATIVE 10/15/2018 0927   KETONESUR NEGATIVE 10/15/2018 0927   PROTEINUR NEGATIVE 10/15/2018 0927   NITRITE NEGATIVE 10/15/2018 0927   LEUKOCYTESUR SMALL (A) 10/15/2018 0927      Imaging Results:    Ct Abdomen Pelvis Wo Contrast  Result Date: 10/22/2018 CLINICAL DATA:  73 year old female with shortness of breath, chest tightness and hypotension. Recently hospitalized. CTA was attempted but aborted due to left antecubital contrast extravasation. EXAM: CT CHEST, ABDOMEN AND PELVIS WITHOUT CONTRAST TECHNIQUE: Multidetector CT imaging of the chest, abdomen  and pelvis was performed following the standard protocol without IV contrast. CONTRAST:  Attempted contrast administration oral air resulting in left upper extremity extravasation. Refer to the EMR for details. COMPARISON:  Recent CT Abdomen and Pelvis 10/15/2018 FINDINGS: CT CHEST FINDINGS Cardiovascular: No cardiomegaly or pericardial effusion. Mild Calcified aortic atherosclerosis. No calcified coronary artery atherosclerosis is evident. Vascular patency is not evaluated in the absence of IV contrast. Mediastinum/Nodes: Negative. No mediastinal lymphadenopathy. Lungs/Pleura: Major airways are patent. Mild dependent atelectasis in both costophrenic angles greater on the left. Mild superimposed linear scarring or atelectasis in the lateral basal segment of the left lower lobe. Lower lung volumes are improved compared to the recent CT Abdomen and Pelvis. No pleural effusion or other abnormal pulmonary opacity. Musculoskeletal: Exaggerated thoracic kyphosis with multilevel midthoracic ankylosis related to flowing osteophytes or syndesmophytes.  No acute or suspicious osseous abnormality. CT ABDOMEN PELVIS FINDINGS Hepatobiliary: Cholelithiasis without pericholecystic inflammation. Stable liver. Pancreas: Stable, better described on the recent contrast enhanced CT Abdomen and Pelvis. Spleen: Stable and negative. Adrenals/Urinary Tract: Normal adrenal glands. Contrast from the earlier extravasation is being excreted from both kidneys symmetrically with no hydronephrosis or hydroureter. The urinary bladder is moderately distended with combined contrast and urine but otherwise unremarkable. Stomach/Bowel: Resolved large bowel wall thickening and mild mesenteric inflammation. Interval increased retained stool throughout the colon, moderate overall. Previous right hemicolectomy with right abdominal anastomosis, no adverse features. No dilated small bowel. Negative stomach and duodenum. No free air, free fluid.  Vascular/Lymphatic: Aortoiliac calcified atherosclerosis. Vascular patency is not evaluated in the absence of IV contrast. No lymphadenopathy. Reproductive: Grossly stable; better evaluated on the recent contrast enhanced CT Abdomen and Pelvis. Other: No pelvic free fluid. Stable postoperative changes to the ventral abdominal wall. Musculoskeletal: Stable visualized osseous structures. Lower lumbar facet degeneration. IMPRESSION: 1. Resolved large bowel inflammation since 10/15/2018. Interval increased retained stool throughout the colon. 2. No new abnormality in the abdomen or pelvis. Stable pancreatic and uterine findings from the recent contrast enhanced CT Abdomen and Pelvis, please see that report. 3. Atelectasis, but no other acute or inflammatory finding identified in the noncontrast chest. Mild pulmonary atelectasis. 4. IV contrast related to the earlier contrast extravasation being excreted from both kidneys to the urinary bladder. Electronically Signed   By: Genevie Ann M.D.   On: 10/22/2018 19:47   Dg Chest 2 View  Result Date: 10/22/2018 CLINICAL DATA:  Shortness of breath and weakness. EXAM: CHEST - 2 VIEW COMPARISON:  October 15, 2018 FINDINGS: There is air under the left hemidiaphragm, likely within a mildly distended stomach. The heart, hila, mediastinum, lungs, and pleura are unchanged. IMPRESSION: Air under the left hemidiaphragm, which is mildly elevated, is thought to be within a mildly distended air-filled stomach. No other abnormalities. Electronically Signed   By: Dorise Bullion III M.D   On: 10/22/2018 13:24   Ct Chest Wo Contrast  Result Date: 10/22/2018 CLINICAL DATA:  73 year old female with shortness of breath, chest tightness and hypotension. Recently hospitalized. CTA was attempted but aborted due to left antecubital contrast extravasation. EXAM: CT CHEST, ABDOMEN AND PELVIS WITHOUT CONTRAST TECHNIQUE: Multidetector CT imaging of the chest, abdomen and pelvis was performed following  the standard protocol without IV contrast. CONTRAST:  Attempted contrast administration oral air resulting in left upper extremity extravasation. Refer to the EMR for details. COMPARISON:  Recent CT Abdomen and Pelvis 10/15/2018 FINDINGS: CT CHEST FINDINGS Cardiovascular: No cardiomegaly or pericardial effusion. Mild Calcified aortic atherosclerosis. No calcified coronary artery atherosclerosis is evident. Vascular patency is not evaluated in the absence of IV contrast. Mediastinum/Nodes: Negative. No mediastinal lymphadenopathy. Lungs/Pleura: Major airways are patent. Mild dependent atelectasis in both costophrenic angles greater on the left. Mild superimposed linear scarring or atelectasis in the lateral basal segment of the left lower lobe. Lower lung volumes are improved compared to the recent CT Abdomen and Pelvis. No pleural effusion or other abnormal pulmonary opacity. Musculoskeletal: Exaggerated thoracic kyphosis with multilevel midthoracic ankylosis related to flowing osteophytes or syndesmophytes. No acute or suspicious osseous abnormality. CT ABDOMEN PELVIS FINDINGS Hepatobiliary: Cholelithiasis without pericholecystic inflammation. Stable liver. Pancreas: Stable, better described on the recent contrast enhanced CT Abdomen and Pelvis. Spleen: Stable and negative. Adrenals/Urinary Tract: Normal adrenal glands. Contrast from the earlier extravasation is being excreted from both kidneys symmetrically with no hydronephrosis or hydroureter. The urinary bladder  is moderately distended with combined contrast and urine but otherwise unremarkable. Stomach/Bowel: Resolved large bowel wall thickening and mild mesenteric inflammation. Interval increased retained stool throughout the colon, moderate overall. Previous right hemicolectomy with right abdominal anastomosis, no adverse features. No dilated small bowel. Negative stomach and duodenum. No free air, free fluid. Vascular/Lymphatic: Aortoiliac calcified  atherosclerosis. Vascular patency is not evaluated in the absence of IV contrast. No lymphadenopathy. Reproductive: Grossly stable; better evaluated on the recent contrast enhanced CT Abdomen and Pelvis. Other: No pelvic free fluid. Stable postoperative changes to the ventral abdominal wall. Musculoskeletal: Stable visualized osseous structures. Lower lumbar facet degeneration. IMPRESSION: 1. Resolved large bowel inflammation since 10/15/2018. Interval increased retained stool throughout the colon. 2. No new abnormality in the abdomen or pelvis. Stable pancreatic and uterine findings from the recent contrast enhanced CT Abdomen and Pelvis, please see that report. 3. Atelectasis, but no other acute or inflammatory finding identified in the noncontrast chest. Mild pulmonary atelectasis. 4. IV contrast related to the earlier contrast extravasation being excreted from both kidneys to the urinary bladder. Electronically Signed   By: Genevie Ann M.D.   On: 10/22/2018 19:47    My personal review of EKG: Rhythm NSR   Assessment & Plan:    Active Problems:   Dyspnea   1. Dyspnea-unclear etiology, CT angiogram was earlier ordered but later changed to CT chest and abdomen without contrast as patient symptoms had improved.  CT chest abdomen without contrast did not show significant abnormality.  She is not requiring oxygen anymore.  Will order VQ scan in a.m. to rule out pulmonary embolism, obtain echocardiogram in a.m.Influenza panel is negative.  2. ?  Sepsis-patient had elevated lactic acid 2.1, presented with hypotension so she was empirically started on vancomycin and cefepime.  Will continue antibiotics for now, consider stopping antibiotics in next 24 hours if patient remains stable and no clear source of infection is identified.  3. History of CAD-EKG shows no ST changes.  Will obtain echocardiogram in a.m.  Continue aspirin, Imdur, Zocor.  4. Anxiety-continue PRN alprazolam  5. Hypertension-blood  pressure is stable, continue  verapamil.  Will hold lisinopril triamterene/HCTZ at this time due to soft blood pressure initially when patient came to ED.   DVT Prophylaxis-   Lovenox   AM Labs Ordered, also please review Full Orders  Family Communication: Admission, patients condition and plan of care including tests being ordered have been discussed with the patient and her husband at bedside who indicate understanding and agree with the plan and Code Status.  Code Status: Full code  Admission status: Observation: Based on patients clinical presentation and evaluation of above clinical data, I have made determination that patient will need less than 2 midnight stay in the hospital.  Time spent in minutes : 60 minutes   Oswald Hillock M.D on 10/22/2018 at 9:06 PM

## 2018-10-22 NOTE — ED Notes (Signed)
Placed ice pack on patient's left AC.

## 2018-10-22 NOTE — ED Provider Notes (Addendum)
Emergency Department Provider Note   I have reviewed the triage vital signs and the nursing notes.   HISTORY  Chief Complaint Shortness of Breath   HPI Hannah Reynolds is a 73 y.o. female with PMH of CAD, HLD, HTN, GERD, and recent admit for colitis the emergency department for evaluation of shortness of breath, cough, congestion, and mild chest tightness.  Symptoms have been present since her discharge onset 3/5 but have worsened over the past several days.  She has been compliant with her home antibiotics and states that her gastrointestinal symptoms are improved significantly.  She is unsure regarding fever but states she has felt hot and cold at home.  No sick contacts or recent travel.  Patient denies any hemoptysis.  No sharp, pleuritic chest pain.  Patient denies any lower extremity swelling.    Past Medical History:  Diagnosis Date  . Anxiety   . CAD (coronary artery disease)   . Diverticulosis   . GERD (gastroesophageal reflux disease)   . H/O: GI bleed   . Hemorrhoids   . Hyperlipidemia   . Hypertension   . Nephrolithiasis   . Unspecified glaucoma(365.9)     Patient Active Problem List   Diagnosis Date Noted  . Nausea vomiting and diarrhea   . Class 1 obesity due to excess calories with body mass index (BMI) of 32.0 to 32.9 in adult   . Acute colitis 10/15/2018  . Hematochezia 10/15/2018  . Osteoarthritis 06/23/2014  . Migraine 06/23/2014  . Knee pain 03/27/2014  . Muscle weakness (generalized) 03/27/2014  . Meniscal injury 03/27/2014  . Contact dermatitis 04/13/2009  . DEPENDENT EDEMA, LEGS 03/27/2007  . SYMPTOM, SWELLING, ABDOMINAL, GENERALIZED 03/27/2007  . Hyperlipidemia 03/14/2007  . Generalized anxiety disorder 03/14/2007  . Glaucoma 03/14/2007  . Essential hypertension 03/14/2007  . NEPHROLITHIASIS, HX OF 03/14/2007    Past Surgical History:  Procedure Laterality Date  . ABDOMINAL EXPLORATION SURGERY     ileus  . COLONOSCOPY N/A 11/20/2014   Procedure: COLONOSCOPY;  Surgeon: Rogene Houston, MD;  Location: [facility];  Service: Endoscopy;  Laterality: N/A;  730 with pediatric scope  . hemicolection in 1999 during exploratory surgery     . HERNIA REPAIR    . INCISIONAL HERNIA REPAIR    . VENTRAL HERNIA REPAIR      Allergies Patient has no known allergies.  Family History  Problem Relation Age of Onset  . Heart disease Mother        MI 66s, nonsmoker  . Heart disease Father        MI 49, nonsmoker  . Heart disease Brother        MI 59, nonsmoker    Social History Social History   Tobacco Use  . Smoking status: Never Smoker  . Smokeless tobacco: Never Used  . Tobacco comment: second hand smoke exposure  Substance Use Topics  . Alcohol use: No    Alcohol/week: 0.0 standard drinks  . Drug use: No    Review of Systems  Constitutional: Positive subjective fever/chills Eyes: No visual changes. ENT: No sore throat. Cardiovascular: Positive chest pain. Respiratory: Positive shortness of breath and cough.  Gastrointestinal: No abdominal pain.  No nausea, no vomiting.  No diarrhea.  No constipation. Genitourinary: Negative for dysuria. Musculoskeletal: Negative for back pain. Skin: Negative for rash. Neurological: Negative for headaches, focal weakness or numbness.  10-point ROS otherwise negative.  ____________________________________________   PHYSICAL EXAM:  VITAL SIGNS: ED Triage Vitals  Enc Vitals  Group     BP 10/22/18 1213 (!) 100/58     Pulse Rate 10/22/18 1213 76     Resp 10/22/18 1213 (!) 38     Temp 10/22/18 1213 97.8 F (36.6 C)     Temp Source 10/22/18 1213 Oral     SpO2 10/22/18 1213 94 %     Weight 10/22/18 1212 196 lb (88.9 kg)     Height 10/22/18 1212 5\' 6"  (1.676 m)   Constitutional: Alert and oriented. Well appearing and in no acute distress. Eyes: Conjunctivae are normal.  Head: Atraumatic. Nose: No congestion/rhinnorhea. Mouth/Throat: Mucous membranes are moist. Neck: No  stridor.  Cardiovascular: Normal rate, regular rhythm. Good peripheral circulation. Grossly normal heart sounds.   Respiratory: Increased respiratory effort.  No retractions. Lungs CTAB. Gastrointestinal: Soft and nontender. No distention.  Musculoskeletal: No lower extremity tenderness nor edema. No gross deformities of extremities. Neurologic:  Normal speech and language. No gross focal neurologic deficits are appreciated.  Skin:  Skin is warm, dry and intact. No rash noted.  ____________________________________________   LABS (all labs ordered are listed, but only abnormal results are displayed)  Labs Reviewed  CBC WITH DIFFERENTIAL/PLATELET - Abnormal; Notable for the following components:      Result Value   Platelets 404 (*)    All other components within normal limits  BASIC METABOLIC PANEL - Abnormal; Notable for the following components:   Potassium 3.3 (*)    Glucose, Bld 118 (*)    Creatinine, Ser 1.16 (*)    GFR calc non Af Amer 48 (*)    GFR calc Af Amer 55 (*)    All other components within normal limits  LACTIC ACID, PLASMA - Abnormal; Notable for the following components:   Lactic Acid, Venous 2.1 (*)    All other components within normal limits  TROPONIN I  BRAIN NATRIURETIC PEPTIDE  LACTIC ACID, PLASMA  INFLUENZA PANEL BY PCR (TYPE A & B)  CREATININE, SERUM  TROPONIN I   ____________________________________________  EKG   EKG Interpretation  Date/Time:  Tuesday October 22 2018 12:11:41 EDT Ventricular Rate:  76 PR Interval:    QRS Duration: 92 QT Interval:  409 QTC Calculation: 460 R Axis:   77 Text Interpretation:  Sinus rhythm Abnormal R-wave progression, early transition No STEMI.  Confirmed by Nanda Quinton [id number]) on 10/22/2018 12:59:12 PM       ____________________________________________  RADIOLOGY  Ct Abdomen Pelvis Wo Contrast  Result Date: 10/22/2018 CLINICAL DATA:  73 year old female with shortness of breath, chest tightness and  hypotension. Recently hospitalized. CTA was attempted but aborted due to left antecubital contrast extravasation. EXAM: CT CHEST, ABDOMEN AND PELVIS WITHOUT CONTRAST TECHNIQUE: Multidetector CT imaging of the chest, abdomen and pelvis was performed following the standard protocol without IV contrast. CONTRAST:  Attempted contrast administration oral air resulting in left upper extremity extravasation. Refer to the EMR for details. COMPARISON:  Recent CT Abdomen and Pelvis 10/15/2018 FINDINGS: CT CHEST FINDINGS Cardiovascular: No cardiomegaly or pericardial effusion. Mild Calcified aortic atherosclerosis. No calcified coronary artery atherosclerosis is evident. Vascular patency is not evaluated in the absence of IV contrast. Mediastinum/Nodes: Negative. No mediastinal lymphadenopathy. Lungs/Pleura: Major airways are patent. Mild dependent atelectasis in both costophrenic angles greater on the left. Mild superimposed linear scarring or atelectasis in the lateral basal segment of the left lower lobe. Lower lung volumes are improved compared to the recent CT Abdomen and Pelvis. No pleural effusion or other abnormal pulmonary opacity. Musculoskeletal: Exaggerated thoracic kyphosis with  multilevel midthoracic ankylosis related to flowing osteophytes or syndesmophytes. No acute or suspicious osseous abnormality. CT ABDOMEN PELVIS FINDINGS Hepatobiliary: Cholelithiasis without pericholecystic inflammation. Stable liver. Pancreas: Stable, better described on the recent contrast enhanced CT Abdomen and Pelvis. Spleen: Stable and negative. Adrenals/Urinary Tract: Normal adrenal glands. Contrast from the earlier extravasation is being excreted from both kidneys symmetrically with no hydronephrosis or hydroureter. The urinary bladder is moderately distended with combined contrast and urine but otherwise unremarkable. Stomach/Bowel: Resolved large bowel wall thickening and mild mesenteric inflammation. Interval increased retained  stool throughout the colon, moderate overall. Previous right hemicolectomy with right abdominal anastomosis, no adverse features. No dilated small bowel. Negative stomach and duodenum. No free air, free fluid. Vascular/Lymphatic: Aortoiliac calcified atherosclerosis. Vascular patency is not evaluated in the absence of IV contrast. No lymphadenopathy. Reproductive: Grossly stable; better evaluated on the recent contrast enhanced CT Abdomen and Pelvis. Other: No pelvic free fluid. Stable postoperative changes to the ventral abdominal wall. Musculoskeletal: Stable visualized osseous structures. Lower lumbar facet degeneration. IMPRESSION: 1. Resolved large bowel inflammation since 10/15/2018. Interval increased retained stool throughout the colon. 2. No new abnormality in the abdomen or pelvis. Stable pancreatic and uterine findings from the recent contrast enhanced CT Abdomen and Pelvis, please see that report. 3. Atelectasis, but no other acute or inflammatory finding identified in the noncontrast chest. Mild pulmonary atelectasis. 4. IV contrast related to the earlier contrast extravasation being excreted from both kidneys to the urinary bladder. Electronically Signed   By: Genevie Ann M.D.   On: 10/22/2018 19:47   Dg Chest 2 View  Result Date: 10/22/2018 CLINICAL DATA:  Shortness of breath and weakness. EXAM: CHEST - 2 VIEW COMPARISON:  October 15, 2018 FINDINGS: There is air under the left hemidiaphragm, likely within a mildly distended stomach. The heart, hila, mediastinum, lungs, and pleura are unchanged. IMPRESSION: Air under the left hemidiaphragm, which is mildly elevated, is thought to be within a mildly distended air-filled stomach. No other abnormalities. Electronically Signed   By: Dorise Bullion III M.D   On: 10/22/2018 13:24   Ct Chest Wo Contrast  Result Date: 10/22/2018 CLINICAL DATA:  73 year old female with shortness of breath, chest tightness and hypotension. Recently hospitalized. CTA was  attempted but aborted due to left antecubital contrast extravasation. EXAM: CT CHEST, ABDOMEN AND PELVIS WITHOUT CONTRAST TECHNIQUE: Multidetector CT imaging of the chest, abdomen and pelvis was performed following the standard protocol without IV contrast. CONTRAST:  Attempted contrast administration oral air resulting in left upper extremity extravasation. Refer to the EMR for details. COMPARISON:  Recent CT Abdomen and Pelvis 10/15/2018 FINDINGS: CT CHEST FINDINGS Cardiovascular: No cardiomegaly or pericardial effusion. Mild Calcified aortic atherosclerosis. No calcified coronary artery atherosclerosis is evident. Vascular patency is not evaluated in the absence of IV contrast. Mediastinum/Nodes: Negative. No mediastinal lymphadenopathy. Lungs/Pleura: Major airways are patent. Mild dependent atelectasis in both costophrenic angles greater on the left. Mild superimposed linear scarring or atelectasis in the lateral basal segment of the left lower lobe. Lower lung volumes are improved compared to the recent CT Abdomen and Pelvis. No pleural effusion or other abnormal pulmonary opacity. Musculoskeletal: Exaggerated thoracic kyphosis with multilevel midthoracic ankylosis related to flowing osteophytes or syndesmophytes. No acute or suspicious osseous abnormality. CT ABDOMEN PELVIS FINDINGS Hepatobiliary: Cholelithiasis without pericholecystic inflammation. Stable liver. Pancreas: Stable, better described on the recent contrast enhanced CT Abdomen and Pelvis. Spleen: Stable and negative. Adrenals/Urinary Tract: Normal adrenal glands. Contrast from the earlier extravasation is being excreted from both kidneys  symmetrically with no hydronephrosis or hydroureter. The urinary bladder is moderately distended with combined contrast and urine but otherwise unremarkable. Stomach/Bowel: Resolved large bowel wall thickening and mild mesenteric inflammation. Interval increased retained stool throughout the colon, moderate  overall. Previous right hemicolectomy with right abdominal anastomosis, no adverse features. No dilated small bowel. Negative stomach and duodenum. No free air, free fluid. Vascular/Lymphatic: Aortoiliac calcified atherosclerosis. Vascular patency is not evaluated in the absence of IV contrast. No lymphadenopathy. Reproductive: Grossly stable; better evaluated on the recent contrast enhanced CT Abdomen and Pelvis. Other: No pelvic free fluid. Stable postoperative changes to the ventral abdominal wall. Musculoskeletal: Stable visualized osseous structures. Lower lumbar facet degeneration. IMPRESSION: 1. Resolved large bowel inflammation since 10/15/2018. Interval increased retained stool throughout the colon. 2. No new abnormality in the abdomen or pelvis. Stable pancreatic and uterine findings from the recent contrast enhanced CT Abdomen and Pelvis, please see that report. 3. Atelectasis, but no other acute or inflammatory finding identified in the noncontrast chest. Mild pulmonary atelectasis. 4. IV contrast related to the earlier contrast extravasation being excreted from both kidneys to the urinary bladder. Electronically Signed   By: Genevie Ann M.D.   On: 10/22/2018 19:47    ____________________________________________   PROCEDURES  Procedure(s) performed:   Procedures  Emergency Ultrasound Study:   Angiocath insertion Performed by: Margette Fast  Consent: Verbal consent obtained. Risks and benefits: risks, benefits and alternatives were discussed Immediately prior to procedure the correct patient, procedure, equipment, support staff and site/side marked as needed.  Indication: difficult IV access Preparation: Patient was prepped and draped in the usual sterile fashion. Vein Location: Left AC was visualized during assessment for potential access sites and was found to be patent/ easily compressed with linear ultrasound.  The needle was visualized with real-time ultrasound and guided into the  vein. Gauge: 20 G  Normal blood return.  Patient tolerance: Patient tolerated the procedure well with no immediate complications.  CRITICAL CARE Performed by: Margette Fast Total critical care time: 35 minutes Critical care time was exclusive of separately billable procedures and treating other patients. Critical care was necessary to treat or prevent imminent or life-threatening deterioration. Critical care was time spent personally by me on the following activities: development of treatment plan with patient and/or surrogate as well as nursing, discussions with consultants, evaluation of patient's response to treatment, examination of patient, obtaining history from patient or surrogate, ordering and performing treatments and interventions, ordering and review of laboratory studies, ordering and review of radiographic studies, pulse oximetry and re-evaluation of patient's condition.  Nanda Quinton, MD Emergency Medicine  ____________________________________________   INITIAL IMPRESSION / ASSESSMENT AND PLAN / ED COURSE  Pertinent labs & imaging results that were available during my care of the patient were reviewed by me and considered in my medical decision making (see chart for details).  Patient presents to the emergency department for evaluation of shortness of breath symptoms.  She is afebrile here but tachypneic and had low blood pressures at her PCPs office.  She was transported by EMS.  No hypoxemia.   Patient with no leukocytosis prior mild elevated lactic acid.  Troponin is negative.  Flu is pending.  06:00 PM  Patient's left arm IV infiltrated during her CT angio.  I reevaluated the patient when she returned from the radiology department.  Patient is feeling improved after IV fluids.  Shortness of breath is improved as well.  I have opted for CT imaging without contrast of the  chest, abdomen, pelvis with her initially elevated lactate, hypotension, and recent hospitalization.   Would be concerned for possible occult pneumonia and want to confirm that the air under the left hemidiaphragm is from the stomach and not free air.  CT scans without acute findings. Plan for overnight obs with possible ECHO in the AM.   Discussed patient's case with Hospitalist, Dr. Darrick Meigs to request admission. Patient and family (if present) updated with plan. Care transferred to [facility] service.  I reviewed all nursing notes, vitals, pertinent old records, EKGs, labs, imaging (as available).  ____________________________________________  FINAL CLINICAL IMPRESSION(S) / ED DIAGNOSES  Final diagnoses:  SOB (shortness of breath)  Hypotension, unspecified hypotension type     MEDICATIONS GIVEN DURING THIS VISIT:  Medications  vancomycin (VANCOCIN) IVPB 1000 mg/200 mL premix (0 mg Intravenous Stopped 10/22/18 2020)    Followed by  vancomycin (VANCOCIN) IVPB 1000 mg/200 mL premix (1,000 mg Intravenous New Bag/Given 10/22/18 2021)  ceFEPIme (MAXIPIME) 2 g in sodium chloride 0.9 % 100 mL IVPB (has no administration in time range)  vancomycin (VANCOCIN) 1,500 mg in sodium chloride 0.9 % 500 mL IVPB (has no administration in time range)  sodium chloride 0.9 % bolus 500 mL (0 mLs Intravenous Stopped 10/22/18 1612)  ceFEPIme (MAXIPIME) 2 g in sodium chloride 0.9 % 100 mL IVPB (0 g Intravenous Stopped 10/22/18 1753)  metroNIDAZOLE (FLAGYL) IVPB 500 mg (0 mg Intravenous Stopped 10/22/18 1916)  sodium chloride 0.9 % bolus 500 mL (0 mLs Intravenous Stopped 10/22/18 1753)  iohexol (OMNIPAQUE) 350 MG/ML injection 100 mL (100 mLs Intravenous Contrast Given 10/22/18 1652)  ondansetron (ZOFRAN) injection 4 mg (4 mg Intravenous Given 10/22/18 2000)    Note:  This document was prepared using Dragon voice recognition software and may include unintentional dictation errors.  Nanda Quinton, MD Emergency Medicine    , [facility], MD 10/22/18 2104    Margette Fast, MD 10/22/18 2105

## 2018-10-22 NOTE — ED Notes (Addendum)
ED TO INPATIENT HANDOFF REPORT  ED Nurse Name and Phone #: Tana Coast RN [phone]  S Name/Age/Gender Hannah Reynolds 73 y.o. female Room/Bed: APA18/APA18  Code Status   Code Status: Prior  Home/SNF/Other Home Patient oriented to: self Is this baseline? Yes   Triage Complete: Triage complete  Chief Complaint sob  Triage Note Patient brought in by EMS from Dr Juel Burrow office for complaint of shortness of breath, chest tightness, and systolic blood pressure in the 80's. States she was discharged from hospital 2 days ago and was complaining of shortness of breath upon discharge that has worsened over the last two days.    Allergies No Known Allergies  Level of Care/Admitting Diagnosis ED Disposition    ED Disposition Condition [facility] Area: [facility] [[id number]]  Level of Care: Telemetry [5]  Diagnosis: Dyspnea [277824]  Admitting Physician: Loree Fee  Attending Physician: Oswald Hillock [[id number]]  PT Class (Do Not Modify): Observation [104]  PT Acc Code (Do Not Modify): Observation [10022]       B Medical/Surgery History Past Medical History:  Diagnosis Date  . Anxiety   . CAD (coronary artery disease)   . Diverticulosis   . GERD (gastroesophageal reflux disease)   . H/O: GI bleed   . Hemorrhoids   . Hyperlipidemia   . Hypertension   . Nephrolithiasis   . Unspecified glaucoma(365.9)    Past Surgical History:  Procedure Laterality Date  . ABDOMINAL EXPLORATION SURGERY     ileus  . COLONOSCOPY N/A 11/20/2014   Procedure: COLONOSCOPY;  Surgeon: Rogene Houston, MD;  [facility];  Service: Endoscopy;  Laterality: N/A;  730 with pediatric scope  . hemicolection in 1999 during exploratory surgery     . HERNIA REPAIR    . INCISIONAL HERNIA REPAIR    . VENTRAL HERNIA REPAIR       A IV Location/Drains/Wounds Patient Lines/Drains/Airways Status   Active Line/Drains/Airways    Name:   Placement date:   Placement  time:   Site:   Days:   Peripheral IV 10/22/18 Right Wrist   10/22/18    1217    Wrist   less than 1          Intake/Output Last 24 hours  Intake/Output Summary (Last 24 hours) at 10/22/2018 2111 Last data filed at 10/22/2018 2020 Gross per 24 hour  Intake 300 ml  Output -  Net 300 ml    Labs/Imaging Results for orders placed or performed during the hospital encounter of 10/22/18 (from the past 48 hour(s))  CBC with Differential     Status: Abnormal   Collection Time: 10/22/18 12:30 PM  Result Value Ref Range   WBC 7.9 4.0 - 10.5 K/uL   RBC 4.42 3.87 - 5.11 MIL/uL   Hemoglobin 13.4 12.0 - 15.0 g/dL   HCT 41.7 36.0 - 46.0 %   MCV 94.3 80.0 - 100.0 fL   MCH 30.3 26.0 - 34.0 pg   MCHC 32.1 30.0 - 36.0 g/dL   RDW 13.6 11.5 - 15.5 %   Platelets 404 (H) 150 - 400 K/uL   nRBC 0.0 0.0 - 0.2 %   Neutrophils Relative % 68 %   Neutro Abs 5.4 1.7 - 7.7 K/uL   Lymphocytes Relative 14 %   Lymphs Abs 1.1 0.7 - 4.0 K/uL   Monocytes Relative 12 %   Monocytes Absolute 1.0 0.1 - 1.0 K/uL   Eosinophils Relative 4 %  Eosinophils Absolute 0.3 0.0 - 0.5 K/uL   Basophils Relative 1 %   Basophils Absolute 0.0 0.0 - 0.1 K/uL   Immature Granulocytes 1 %   Abs Immature Granulocytes 0.04 0.00 - 0.07 K/uL    Comment: Performed at [facility], [address] [place], [place] [address]  Basic metabolic panel     Status: Abnormal   Collection Time: 10/22/18 12:30 PM  Result Value Ref Range   Sodium 137 135 - 145 mmol/L   Potassium 3.3 (L) 3.5 - 5.1 mmol/L   Chloride 103 98 - 111 mmol/L   CO2 22 22 - 32 mmol/L   Glucose, Bld 118 (H) 70 - 99 mg/dL   BUN 15 8 - 23 mg/dL   Creatinine, Ser 1.16 (H) 0.44 - 1.00 mg/dL   Calcium 9.3 8.9 - 10.3 mg/dL   GFR calc non Af Amer 48 (L) >60 mL/min   GFR calc Af Amer 55 (L) >60 mL/min   Anion gap 12 5 - 15    Comment: Performed at [facility], [address], [place], [place] [address]  Troponin I - ONCE - STAT     Status: None   Collection Time: 10/22/18  12:30 PM  Result Value Ref Range   Troponin I <0.03 <0.03 ng/mL    Comment: Performed at [facility], [address] [place], [place] [address]  Brain natriuretic peptide     Status: None   Collection Time: 10/22/18 12:30 PM  Result Value Ref Range   B Natriuretic Peptide 16.0 0.0 - 100.0 pg/mL    Comment: Performed at [facility], [address], [place], [place] [address]  Lactic acid, plasma     Status: Abnormal   Collection Time: 10/22/18 12:30 PM  Result Value Ref Range   Lactic Acid, Venous 2.1 (HH) 0.5 - 1.9 mmol/L    Comment: CRITICAL RESULT CALLED TO, READ BACK BY AND VERIFIED WITH: CARDWELL,L AT 1259 ON 3.10.20 BY ISLEY,B Performed at [facility], [address] [place], [place] [address]   Influenza panel by PCR (type A & B)     Status: None   Collection Time: 10/22/18 12:34 PM  Result Value Ref Range   Influenza A By PCR NEGATIVE NEGATIVE   Influenza B By PCR NEGATIVE NEGATIVE    Comment: (NOTE) The Xpert Xpress Flu assay is intended as an aid in the diagnosis of  influenza and should not be used as a sole basis for treatment.  This  assay is FDA approved for nasopharyngeal swab specimens only. Nasal  washings and aspirates are unacceptable for Xpert Xpress Flu testing. Performed at [facility], [address] [place], [place] [address]   Lactic acid, plasma     Status: None   Collection Time: 10/22/18  3:25 PM  Result Value Ref Range   Lactic Acid, Venous 1.8 0.5 - 1.9 mmol/L    Comment: Performed at [facility], [address], [place], [place] [address]   Ct Abdomen Pelvis Wo Contrast  Result Date: 10/22/2018 CLINICAL DATA:  73 year old female with shortness of breath, chest tightness and hypotension. Recently hospitalized. CTA was attempted but aborted due to left antecubital contrast extravasation. EXAM: CT CHEST, ABDOMEN AND PELVIS WITHOUT CONTRAST TECHNIQUE: Multidetector CT imaging of the chest, abdomen and pelvis was performed following the standard  protocol without IV contrast. CONTRAST:  Attempted contrast administration oral air resulting in left upper extremity extravasation. Refer to the EMR for details. COMPARISON:  Recent CT Abdomen and Pelvis 10/15/2018  FINDINGS: CT CHEST FINDINGS Cardiovascular: No cardiomegaly or pericardial effusion. Mild Calcified aortic atherosclerosis. No calcified coronary artery atherosclerosis is evident. Vascular patency is not evaluated in the absence of IV contrast. Mediastinum/Nodes: Negative. No mediastinal lymphadenopathy. Lungs/Pleura: Major airways are patent. Mild dependent atelectasis in both costophrenic angles greater on the left. Mild superimposed linear scarring or atelectasis in the lateral basal segment of the left lower lobe. Lower lung volumes are improved compared to the recent CT Abdomen and Pelvis. No pleural effusion or other abnormal pulmonary opacity. Musculoskeletal: Exaggerated thoracic kyphosis with multilevel midthoracic ankylosis related to flowing osteophytes or syndesmophytes. No acute or suspicious osseous abnormality. CT ABDOMEN PELVIS FINDINGS Hepatobiliary: Cholelithiasis without pericholecystic inflammation. Stable liver. Pancreas: Stable, better described on the recent contrast enhanced CT Abdomen and Pelvis. Spleen: Stable and negative. Adrenals/Urinary Tract: Normal adrenal glands. Contrast from the earlier extravasation is being excreted from both kidneys symmetrically with no hydronephrosis or hydroureter. The urinary bladder is moderately distended with combined contrast and urine but otherwise unremarkable. Stomach/Bowel: Resolved large bowel wall thickening and mild mesenteric inflammation. Interval increased retained stool throughout the colon, moderate overall. Previous right hemicolectomy with right abdominal anastomosis, no adverse features. No dilated small bowel. Negative stomach and duodenum. No free air, free fluid. Vascular/Lymphatic: Aortoiliac calcified atherosclerosis.  Vascular patency is not evaluated in the absence of IV contrast. No lymphadenopathy. Reproductive: Grossly stable; better evaluated on the recent contrast enhanced CT Abdomen and Pelvis. Other: No pelvic free fluid. Stable postoperative changes to the ventral abdominal wall. Musculoskeletal: Stable visualized osseous structures. Lower lumbar facet degeneration. IMPRESSION: 1. Resolved large bowel inflammation since 10/15/2018. Interval increased retained stool throughout the colon. 2. No new abnormality in the abdomen or pelvis. Stable pancreatic and uterine findings from the recent contrast enhanced CT Abdomen and Pelvis, please see that report. 3. Atelectasis, but no other acute or inflammatory finding identified in the noncontrast chest. Mild pulmonary atelectasis. 4. IV contrast related to the earlier contrast extravasation being excreted from both kidneys to the urinary bladder. Electronically Signed   By: Genevie Ann M.D.   On: 10/22/2018 19:47   Dg Chest 2 View  Result Date: 10/22/2018 CLINICAL DATA:  Shortness of breath and weakness. EXAM: CHEST - 2 VIEW COMPARISON:  October 15, 2018 FINDINGS: There is air under the left hemidiaphragm, likely within a mildly distended stomach. The heart, hila, mediastinum, lungs, and pleura are unchanged. IMPRESSION: Air under the left hemidiaphragm, which is mildly elevated, is thought to be within a mildly distended air-filled stomach. No other abnormalities. Electronically Signed   By: Dorise Bullion III M.D   On: 10/22/2018 13:24   Ct Chest Wo Contrast  Result Date: 10/22/2018 CLINICAL DATA:  73 year old female with shortness of breath, chest tightness and hypotension. Recently hospitalized. CTA was attempted but aborted due to left antecubital contrast extravasation. EXAM: CT CHEST, ABDOMEN AND PELVIS WITHOUT CONTRAST TECHNIQUE: Multidetector CT imaging of the chest, abdomen and pelvis was performed following the standard protocol without IV contrast. CONTRAST:   Attempted contrast administration oral air resulting in left upper extremity extravasation. Refer to the EMR for details. COMPARISON:  Recent CT Abdomen and Pelvis 10/15/2018 FINDINGS: CT CHEST FINDINGS Cardiovascular: No cardiomegaly or pericardial effusion. Mild Calcified aortic atherosclerosis. No calcified coronary artery atherosclerosis is evident. Vascular patency is not evaluated in the absence of IV contrast. Mediastinum/Nodes: Negative. No mediastinal lymphadenopathy. Lungs/Pleura: Major airways are patent. Mild dependent atelectasis in both costophrenic angles greater on the left. Mild superimposed linear scarring or atelectasis  in the lateral basal segment of the left lower lobe. Lower lung volumes are improved compared to the recent CT Abdomen and Pelvis. No pleural effusion or other abnormal pulmonary opacity. Musculoskeletal: Exaggerated thoracic kyphosis with multilevel midthoracic ankylosis related to flowing osteophytes or syndesmophytes. No acute or suspicious osseous abnormality. CT ABDOMEN PELVIS FINDINGS Hepatobiliary: Cholelithiasis without pericholecystic inflammation. Stable liver. Pancreas: Stable, better described on the recent contrast enhanced CT Abdomen and Pelvis. Spleen: Stable and negative. Adrenals/Urinary Tract: Normal adrenal glands. Contrast from the earlier extravasation is being excreted from both kidneys symmetrically with no hydronephrosis or hydroureter. The urinary bladder is moderately distended with combined contrast and urine but otherwise unremarkable. Stomach/Bowel: Resolved large bowel wall thickening and mild mesenteric inflammation. Interval increased retained stool throughout the colon, moderate overall. Previous right hemicolectomy with right abdominal anastomosis, no adverse features. No dilated small bowel. Negative stomach and duodenum. No free air, free fluid. Vascular/Lymphatic: Aortoiliac calcified atherosclerosis. Vascular patency is not evaluated in the  absence of IV contrast. No lymphadenopathy. Reproductive: Grossly stable; better evaluated on the recent contrast enhanced CT Abdomen and Pelvis. Other: No pelvic free fluid. Stable postoperative changes to the ventral abdominal wall. Musculoskeletal: Stable visualized osseous structures. Lower lumbar facet degeneration. IMPRESSION: 1. Resolved large bowel inflammation since 10/15/2018. Interval increased retained stool throughout the colon. 2. No new abnormality in the abdomen or pelvis. Stable pancreatic and uterine findings from the recent contrast enhanced CT Abdomen and Pelvis, please see that report. 3. Atelectasis, but no other acute or inflammatory finding identified in the noncontrast chest. Mild pulmonary atelectasis. 4. IV contrast related to the earlier contrast extravasation being excreted from both kidneys to the urinary bladder. Electronically Signed   By: Genevie Ann M.D.   On: 10/22/2018 19:47    Pending Labs Unresulted Labs (From admission, onward)    Start     Ordered   10/23/18 0500  Creatinine, serum  Every Mon-Wed-Fri (0500),   R     10/22/18 1406   10/22/18 2031  Troponin I - Once  Once,   STAT     10/22/18 2030   Signed and Held  CBC  (enoxaparin (LOVENOX)    CrCl >/= 30 ml/min)  Once,   R    Comments:  Baseline for enoxaparin therapy IF NOT ALREADY DRAWN.  Notify MD if PLT < 100 K.    Signed and Held   Signed and Held  Creatinine, serum  (enoxaparin (LOVENOX)    CrCl >/= 30 ml/min)  Once,   R    Comments:  Baseline for enoxaparin therapy IF NOT ALREADY DRAWN.    Signed and Held   Signed and Held  Creatinine, serum  (enoxaparin (LOVENOX)    CrCl >/= 30 ml/min)  Weekly,   R    Comments:  while on enoxaparin therapy    Signed and Held   Signed and Held  CBC  Tomorrow morning,   R     Signed and Held   Signed and Held  Comprehensive metabolic panel  Tomorrow morning,   R     Signed and Held          Vitals/Pain Today's Vitals   10/22/18 1830 10/22/18 1930 10/22/18  2000 10/22/18 2030  BP: (!) 100/51 (!) 120/56 125/60 111/62  Pulse: 72  81 66  Resp:      Temp:      TempSrc:      SpO2: 99%  97% 97%  Weight:  Height:      PainSc:        Isolation Precautions Droplet precaution  Medications Medications  vancomycin (VANCOCIN) IVPB 1000 mg/200 mL premix (0 mg Intravenous Stopped 10/22/18 2020)    Followed by  vancomycin (VANCOCIN) IVPB 1000 mg/200 mL premix (1,000 mg Intravenous New Bag/Given 10/22/18 2021)  ceFEPIme (MAXIPIME) 2 g in sodium chloride 0.9 % 100 mL IVPB (has no administration in time range)  vancomycin (VANCOCIN) 1,500 mg in sodium chloride 0.9 % 500 mL IVPB (has no administration in time range)  sodium chloride 0.9 % bolus 500 mL (0 mLs Intravenous Stopped 10/22/18 1612)  ceFEPIme (MAXIPIME) 2 g in sodium chloride 0.9 % 100 mL IVPB (0 g Intravenous Stopped 10/22/18 1753)  metroNIDAZOLE (FLAGYL) IVPB 500 mg (0 mg Intravenous Stopped 10/22/18 1916)  sodium chloride 0.9 % bolus 500 mL (0 mLs Intravenous Stopped 10/22/18 1753)  iohexol (OMNIPAQUE) 350 MG/ML injection 100 mL (100 mLs Intravenous Contrast Given 10/22/18 1652)  ondansetron (ZOFRAN) injection 4 mg (4 mg Intravenous Given 10/22/18 2000)    Mobility walks Low fall risk   Focused Assessments    R Recommendations: See Admitting Provider Note  Report given to:   Additional Notes:  ED TO INPATIENT HANDOFF REPORT  ED Nurse Name and Phone #:   S Name/Age/Gender Hannah Reynolds 73 y.o. female Room/Bed: APA18/APA18  Code Status   Code Status: Prior  Home/SNF/Other Home Patient oriented to: self Is this baseline? Yes   Triage Complete: Triage complete  Chief Complaint sob  Triage Note Patient brought in by EMS from Dr Juel Burrow office for complaint of shortness of breath, chest tightness, and systolic blood pressure in the 80's. States she was discharged from hospital 2 days ago and was complaining of shortness of breath upon discharge that has worsened over  the last two days.    Allergies No Known Allergies  Level of Care/Admitting Diagnosis ED Disposition    ED Disposition Condition [facility]: [facility] [[id number]]  Level of Care: Telemetry [5]  Diagnosis: Dyspnea [812751]  Admitting Physician: Loree Fee  Attending Physician: Oswald Hillock [[id number]]  PT Class (Do Not Modify): Observation [104]  PT Acc Code (Do Not Modify): Observation [10022]       B Medical/Surgery History Past Medical History:  Diagnosis Date  . Anxiety   . CAD (coronary artery disease)   . Diverticulosis   . GERD (gastroesophageal reflux disease)   . H/O: GI bleed   . Hemorrhoids   . Hyperlipidemia   . Hypertension   . Nephrolithiasis   . Unspecified glaucoma(365.9)    Past Surgical History:  Procedure Laterality Date  . ABDOMINAL EXPLORATION SURGERY     ileus  . COLONOSCOPY N/A 11/20/2014   Procedure: COLONOSCOPY;  Surgeon: Rogene Houston, MD;  [facility];  Service: Endoscopy;  Laterality: N/A;  730 with pediatric scope  . hemicolection in 1999 during exploratory surgery     . HERNIA REPAIR    . INCISIONAL HERNIA REPAIR    . VENTRAL HERNIA REPAIR       A IV Location/Drains/Wounds Patient Lines/Drains/Airways Status   Active Line/Drains/Airways    Name:   Placement date:   Placement time:   Site:   Days:   Peripheral IV 10/22/18 Right Wrist   10/22/18    1217    Wrist   less than 1          Intake/Output Last 24  hours  Intake/Output Summary (Last 24 hours) at 10/22/2018 2111 Last data filed at 10/22/2018 2020 Gross per 24 hour  Intake 300 ml  Output -  Net 300 ml    Labs/Imaging Results for orders placed or performed during the hospital encounter of 10/22/18 (from the past 48 hour(s))  CBC with Differential     Status: Abnormal   Collection Time: 10/22/18 12:30 PM  Result Value Ref Range   WBC 7.9 4.0 - 10.5 K/uL   RBC 4.42 3.87 - 5.11 MIL/uL   Hemoglobin 13.4 12.0 - 15.0 g/dL    HCT 41.7 36.0 - 46.0 %   MCV 94.3 80.0 - 100.0 fL   MCH 30.3 26.0 - 34.0 pg   MCHC 32.1 30.0 - 36.0 g/dL   RDW 13.6 11.5 - 15.5 %   Platelets 404 (H) 150 - 400 K/uL   nRBC 0.0 0.0 - 0.2 %   Neutrophils Relative % 68 %   Neutro Abs 5.4 1.7 - 7.7 K/uL   Lymphocytes Relative 14 %   Lymphs Abs 1.1 0.7 - 4.0 K/uL   Monocytes Relative 12 %   Monocytes Absolute 1.0 0.1 - 1.0 K/uL   Eosinophils Relative 4 %   Eosinophils Absolute 0.3 0.0 - 0.5 K/uL   Basophils Relative 1 %   Basophils Absolute 0.0 0.0 - 0.1 K/uL   Immature Granulocytes 1 %   Abs Immature Granulocytes 0.04 0.00 - 0.07 K/uL    Comment: Performed at [facility], [address], [place], [place] [address]  Basic metabolic panel     Status: Abnormal   Collection Time: 10/22/18 12:30 PM  Result Value Ref Range   Sodium 137 135 - 145 mmol/L   Potassium 3.3 (L) 3.5 - 5.1 mmol/L   Chloride 103 98 - 111 mmol/L   CO2 22 22 - 32 mmol/L   Glucose, Bld 118 (H) 70 - 99 mg/dL   BUN 15 8 - 23 mg/dL   Creatinine, Ser 1.16 (H) 0.44 - 1.00 mg/dL   Calcium 9.3 8.9 - 10.3 mg/dL   GFR calc non Af Amer 48 (L) >60 mL/min   GFR calc Af Amer 55 (L) >60 mL/min   Anion gap 12 5 - 15    Comment: Performed at [facility], [address], [place], [place] [address]  Troponin I - ONCE - STAT     Status: None   Collection Time: 10/22/18 12:30 PM  Result Value Ref Range   Troponin I <0.03 <0.03 ng/mL    Comment: Performed at [facility], [address], [place], [place] [address]  Brain natriuretic peptide     Status: None   Collection Time: 10/22/18 12:30 PM  Result Value Ref Range   B Natriuretic Peptide 16.0 0.0 - 100.0 pg/mL    Comment: Performed at [facility], [address], [place], [place] [address]  Lactic acid, plasma     Status: Abnormal   Collection Time: 10/22/18 12:30 PM  Result Value Ref Range   Lactic Acid, Venous 2.1 (HH) 0.5 - 1.9 mmol/L    Comment: CRITICAL RESULT CALLED TO, READ BACK BY AND VERIFIED WITH: CARDWELL,L AT  1259 ON 3.10.20 BY ISLEY,B Performed at [facility], [address] [place], [place] [address]   Influenza panel by PCR (type A & B)     Status: None   Collection Time: 10/22/18 12:34 PM  Result Value Ref Range   Influenza A By PCR NEGATIVE NEGATIVE   Influenza B By PCR  NEGATIVE NEGATIVE    Comment: (NOTE) The Xpert Xpress Flu assay is intended as an aid in the diagnosis of  influenza and should not be used as a sole basis for treatment.  This  assay is FDA approved for nasopharyngeal swab specimens only. Nasal  washings and aspirates are unacceptable for Xpert Xpress Flu testing. Performed at [facility], [address] [place], [place] [address]   Lactic acid, plasma     Status: None   Collection Time: 10/22/18  3:25 PM  Result Value Ref Range   Lactic Acid, Venous 1.8 0.5 - 1.9 mmol/L    Comment: Performed at [facility], [address], [place], [place] [address]   Ct Abdomen Pelvis Wo Contrast  Result Date: 10/22/2018 CLINICAL DATA:  73 year old female with shortness of breath, chest tightness and hypotension. Recently hospitalized. CTA was attempted but aborted due to left antecubital contrast extravasation. EXAM: CT CHEST, ABDOMEN AND PELVIS WITHOUT CONTRAST TECHNIQUE: Multidetector CT imaging of the chest, abdomen and pelvis was performed following the standard protocol without IV contrast. CONTRAST:  Attempted contrast administration oral air resulting in left upper extremity extravasation. Refer to the EMR for details. COMPARISON:  Recent CT Abdomen and Pelvis 10/15/2018 FINDINGS: CT CHEST FINDINGS Cardiovascular: No cardiomegaly or pericardial effusion. Mild Calcified aortic atherosclerosis. No calcified coronary artery atherosclerosis is evident. Vascular patency is not evaluated in the absence of IV contrast. Mediastinum/Nodes: Negative. No mediastinal lymphadenopathy. Lungs/Pleura: Major airways are patent. Mild dependent atelectasis in both costophrenic angles greater on the  left. Mild superimposed linear scarring or atelectasis in the lateral basal segment of the left lower lobe. Lower lung volumes are improved compared to the recent CT Abdomen and Pelvis. No pleural effusion or other abnormal pulmonary opacity. Musculoskeletal: Exaggerated thoracic kyphosis with multilevel midthoracic ankylosis related to flowing osteophytes or syndesmophytes. No acute or suspicious osseous abnormality. CT ABDOMEN PELVIS FINDINGS Hepatobiliary: Cholelithiasis without pericholecystic inflammation. Stable liver. Pancreas: Stable, better described on the recent contrast enhanced CT Abdomen and Pelvis. Spleen: Stable and negative. Adrenals/Urinary Tract: Normal adrenal glands. Contrast from the earlier extravasation is being excreted from both kidneys symmetrically with no hydronephrosis or hydroureter. The urinary bladder is moderately distended with combined contrast and urine but otherwise unremarkable. Stomach/Bowel: Resolved large bowel wall thickening and mild mesenteric inflammation. Interval increased retained stool throughout the colon, moderate overall. Previous right hemicolectomy with right abdominal anastomosis, no adverse features. No dilated small bowel. Negative stomach and duodenum. No free air, free fluid. Vascular/Lymphatic: Aortoiliac calcified atherosclerosis. Vascular patency is not evaluated in the absence of IV contrast. No lymphadenopathy. Reproductive: Grossly stable; better evaluated on the recent contrast enhanced CT Abdomen and Pelvis. Other: No pelvic free fluid. Stable postoperative changes to the ventral abdominal wall. Musculoskeletal: Stable visualized osseous structures. Lower lumbar facet degeneration. IMPRESSION: 1. Resolved large bowel inflammation since 10/15/2018. Interval increased retained stool throughout the colon. 2. No new abnormality in the abdomen or pelvis. Stable pancreatic and uterine findings from the recent contrast enhanced CT Abdomen and Pelvis,  please see that report. 3. Atelectasis, but no other acute or inflammatory finding identified in the noncontrast chest. Mild pulmonary atelectasis. 4. IV contrast related to the earlier contrast extravasation being excreted from both kidneys to the urinary bladder. Electronically Signed   By: Genevie Ann M.D.   On: 10/22/2018 19:47   Dg Chest 2 View  Result Date: 10/22/2018 CLINICAL DATA:  Shortness of breath and weakness. EXAM: CHEST - 2 VIEW COMPARISON:  October 15, 2018 FINDINGS:  There is air under the left hemidiaphragm, likely within a mildly distended stomach. The heart, hila, mediastinum, lungs, and pleura are unchanged. IMPRESSION: Air under the left hemidiaphragm, which is mildly elevated, is thought to be within a mildly distended air-filled stomach. No other abnormalities. Electronically Signed   By: Dorise Bullion III M.D   On: 10/22/2018 13:24   Ct Chest Wo Contrast  Result Date: 10/22/2018 CLINICAL DATA:  73 year old female with shortness of breath, chest tightness and hypotension. Recently hospitalized. CTA was attempted but aborted due to left antecubital contrast extravasation. EXAM: CT CHEST, ABDOMEN AND PELVIS WITHOUT CONTRAST TECHNIQUE: Multidetector CT imaging of the chest, abdomen and pelvis was performed following the standard protocol without IV contrast. CONTRAST:  Attempted contrast administration oral air resulting in left upper extremity extravasation. Refer to the EMR for details. COMPARISON:  Recent CT Abdomen and Pelvis 10/15/2018 FINDINGS: CT CHEST FINDINGS Cardiovascular: No cardiomegaly or pericardial effusion. Mild Calcified aortic atherosclerosis. No calcified coronary artery atherosclerosis is evident. Vascular patency is not evaluated in the absence of IV contrast. Mediastinum/Nodes: Negative. No mediastinal lymphadenopathy. Lungs/Pleura: Major airways are patent. Mild dependent atelectasis in both costophrenic angles greater on the left. Mild superimposed linear scarring or  atelectasis in the lateral basal segment of the left lower lobe. Lower lung volumes are improved compared to the recent CT Abdomen and Pelvis. No pleural effusion or other abnormal pulmonary opacity. Musculoskeletal: Exaggerated thoracic kyphosis with multilevel midthoracic ankylosis related to flowing osteophytes or syndesmophytes. No acute or suspicious osseous abnormality. CT ABDOMEN PELVIS FINDINGS Hepatobiliary: Cholelithiasis without pericholecystic inflammation. Stable liver. Pancreas: Stable, better described on the recent contrast enhanced CT Abdomen and Pelvis. Spleen: Stable and negative. Adrenals/Urinary Tract: Normal adrenal glands. Contrast from the earlier extravasation is being excreted from both kidneys symmetrically with no hydronephrosis or hydroureter. The urinary bladder is moderately distended with combined contrast and urine but otherwise unremarkable. Stomach/Bowel: Resolved large bowel wall thickening and mild mesenteric inflammation. Interval increased retained stool throughout the colon, moderate overall. Previous right hemicolectomy with right abdominal anastomosis, no adverse features. No dilated small bowel. Negative stomach and duodenum. No free air, free fluid. Vascular/Lymphatic: Aortoiliac calcified atherosclerosis. Vascular patency is not evaluated in the absence of IV contrast. No lymphadenopathy. Reproductive: Grossly stable; better evaluated on the recent contrast enhanced CT Abdomen and Pelvis. Other: No pelvic free fluid. Stable postoperative changes to the ventral abdominal wall. Musculoskeletal: Stable visualized osseous structures. Lower lumbar facet degeneration. IMPRESSION: 1. Resolved large bowel inflammation since 10/15/2018. Interval increased retained stool throughout the colon. 2. No new abnormality in the abdomen or pelvis. Stable pancreatic and uterine findings from the recent contrast enhanced CT Abdomen and Pelvis, please see that report. 3. Atelectasis, but no  other acute or inflammatory finding identified in the noncontrast chest. Mild pulmonary atelectasis. 4. IV contrast related to the earlier contrast extravasation being excreted from both kidneys to the urinary bladder. Electronically Signed   By: Genevie Ann M.D.   On: 10/22/2018 19:47    Pending Labs Unresulted Labs (From admission, onward)    Start     Ordered   10/23/18 0500  Creatinine, serum  Every Mon-Wed-Fri (0500),   R     10/22/18 1406   10/22/18 2031  Troponin I - Once  Once,   STAT     10/22/18 2030   Signed and Held  CBC  (enoxaparin (LOVENOX)    CrCl >/= 30 ml/min)  Once,   R    Comments:  Baseline for  enoxaparin therapy IF NOT ALREADY DRAWN.  Notify MD if PLT < 100 K.    Signed and Held   Signed and Held  Creatinine, serum  (enoxaparin (LOVENOX)    CrCl >/= 30 ml/min)  Once,   R    Comments:  Baseline for enoxaparin therapy IF NOT ALREADY DRAWN.    Signed and Held   Signed and Held  Creatinine, serum  (enoxaparin (LOVENOX)    CrCl >/= 30 ml/min)  Weekly,   R    Comments:  while on enoxaparin therapy    Signed and Held   Signed and Held  CBC  Tomorrow morning,   R     Signed and Held   Signed and Held  Comprehensive metabolic panel  Tomorrow morning,   R     Signed and Held          Vitals/Pain Today's Vitals   10/22/18 1830 10/22/18 1930 10/22/18 2000 10/22/18 2030  BP: (!) 100/51 (!) 120/56 125/60 111/62  Pulse: 72  81 66  Resp:      Temp:      TempSrc:      SpO2: 99%  97% 97%  Weight:      Height:      PainSc:        Isolation Precautions Droplet precaution  Medications Medications  vancomycin (VANCOCIN) IVPB 1000 mg/200 mL premix (0 mg Intravenous Stopped 10/22/18 2020)    Followed by  vancomycin (VANCOCIN) IVPB 1000 mg/200 mL premix (1,000 mg Intravenous New Bag/Given 10/22/18 2021)  ceFEPIme (MAXIPIME) 2 g in sodium chloride 0.9 % 100 mL IVPB (has no administration in time range)  vancomycin (VANCOCIN) 1,500 mg in sodium chloride 0.9 % 500 mL IVPB  (has no administration in time range)  sodium chloride 0.9 % bolus 500 mL (0 mLs Intravenous Stopped 10/22/18 1612)  ceFEPIme (MAXIPIME) 2 g in sodium chloride 0.9 % 100 mL IVPB (0 g Intravenous Stopped 10/22/18 1753)  metroNIDAZOLE (FLAGYL) IVPB 500 mg (0 mg Intravenous Stopped 10/22/18 1916)  sodium chloride 0.9 % bolus 500 mL (0 mLs Intravenous Stopped 10/22/18 1753)  iohexol (OMNIPAQUE) 350 MG/ML injection 100 mL (100 mLs Intravenous Contrast Given 10/22/18 1652)  ondansetron (ZOFRAN) injection 4 mg (4 mg Intravenous Given 10/22/18 2000)    Mobility walks Low fall risk      R Recommendations: See Admitting Provider Note  Report given to: Apolonio Schneiders [phone]  Additional Notes:

## 2018-10-22 NOTE — ED Notes (Signed)
Flushed pt's IV.

## 2018-10-22 NOTE — ED Triage Notes (Addendum)
Patient brought in by EMS from Dr Juel Burrow office for complaint of shortness of breath, chest tightness, and systolic blood pressure in the 80's. States she was discharged from hospital 2 days ago and was complaining of shortness of breath upon discharge that has worsened over the last two days.

## 2018-10-22 NOTE — Progress Notes (Signed)
Pharmacy Antibiotic Note  Hannah Reynolds is a 73 y.o. female admitted on 10/22/2018 with infection- unknown source.  Pharmacy has been consulted for Vancomycin and Cefepime dosing.  Plan: Vancomycin 2000 mg IV x 1 dose. Vancomycin 1500 mg IV every 24 hours.  Goal trough 15-20 mcg/mL. Cefepime 2000 mg IV every 12 hours. Flagyl 500 mg IV every 8 hours. Monitor labs, c/s, and vanco levels as indicated.  Height: 5\' 6"  (167.6 cm) Weight: 196 lb (88.9 kg) IBW/kg (Calculated) : 59.3  Temp (24hrs), Avg:97.8 F (36.6 C), Min:97.8 F (36.6 C), Max:97.8 F (36.6 C)  Recent Labs  Lab 10/16/18 0448 10/17/18 0438 10/22/18 1230  WBC 10.4 6.8 7.9  CREATININE 0.94 0.92 1.16*  LATICACIDVEN  --   --  2.1*    Estimated Creatinine Clearance: 50.7 mL/min (A) (by C-G formula based on SCr of 1.16 mg/dL (H)).    No Known Allergies  Antimicrobials this admission: Vanco 3/10 >>  Cefepime 3/10 >>  Flagyl 3/10 >>  Dose adjustments this admission: N/A  Microbiology results: None pending  Thank you for allowing pharmacy to be a part of this patient's care.  Ramond Craver 10/22/2018 2:07 PM

## 2018-10-22 NOTE — ED Notes (Signed)
CRITICAL VALUE ALERT  Critical Value:  Lactic 2.1  Date & Time Notied:  10/22/2018  Provider Notified: Dr. Laverta Baltimore  Orders Received/Actions taken: see chart

## 2018-10-23 ENCOUNTER — Encounter (HOSPITAL_COMMUNITY): Payer: Self-pay | Admitting: Family Medicine

## 2018-10-23 ENCOUNTER — Observation Stay (HOSPITAL_COMMUNITY): Payer: Medicare Other

## 2018-10-23 ENCOUNTER — Observation Stay (HOSPITAL_BASED_OUTPATIENT_CLINIC_OR_DEPARTMENT_OTHER): Payer: Medicare Other

## 2018-10-23 DIAGNOSIS — Z791 Long term (current) use of non-steroidal anti-inflammatories (NSAID): Secondary | ICD-10-CM | POA: Diagnosis not present

## 2018-10-23 DIAGNOSIS — H409 Unspecified glaucoma: Secondary | ICD-10-CM | POA: Diagnosis present

## 2018-10-23 DIAGNOSIS — K219 Gastro-esophageal reflux disease without esophagitis: Secondary | ICD-10-CM

## 2018-10-23 DIAGNOSIS — J9811 Atelectasis: Secondary | ICD-10-CM | POA: Diagnosis present

## 2018-10-23 DIAGNOSIS — R0600 Dyspnea, unspecified: Secondary | ICD-10-CM | POA: Diagnosis not present

## 2018-10-23 DIAGNOSIS — I2510 Atherosclerotic heart disease of native coronary artery without angina pectoris: Secondary | ICD-10-CM | POA: Diagnosis present

## 2018-10-23 DIAGNOSIS — Z6832 Body mass index (BMI) 32.0-32.9, adult: Secondary | ICD-10-CM | POA: Diagnosis not present

## 2018-10-23 DIAGNOSIS — Z79899 Other long term (current) drug therapy: Secondary | ICD-10-CM | POA: Diagnosis not present

## 2018-10-23 DIAGNOSIS — R0602 Shortness of breath: Secondary | ICD-10-CM | POA: Diagnosis not present

## 2018-10-23 DIAGNOSIS — F411 Generalized anxiety disorder: Secondary | ICD-10-CM | POA: Diagnosis present

## 2018-10-23 DIAGNOSIS — I959 Hypotension, unspecified: Secondary | ICD-10-CM

## 2018-10-23 DIAGNOSIS — Z7722 Contact with and (suspected) exposure to environmental tobacco smoke (acute) (chronic): Secondary | ICD-10-CM | POA: Diagnosis present

## 2018-10-23 DIAGNOSIS — E669 Obesity, unspecified: Secondary | ICD-10-CM | POA: Diagnosis present

## 2018-10-23 DIAGNOSIS — I10 Essential (primary) hypertension: Secondary | ICD-10-CM

## 2018-10-23 DIAGNOSIS — R9389 Abnormal findings on diagnostic imaging of other specified body structures: Secondary | ICD-10-CM | POA: Diagnosis present

## 2018-10-23 DIAGNOSIS — E785 Hyperlipidemia, unspecified: Secondary | ICD-10-CM | POA: Diagnosis present

## 2018-10-23 DIAGNOSIS — Z8249 Family history of ischemic heart disease and other diseases of the circulatory system: Secondary | ICD-10-CM | POA: Diagnosis not present

## 2018-10-23 DIAGNOSIS — Z87442 Personal history of urinary calculi: Secondary | ICD-10-CM | POA: Diagnosis not present

## 2018-10-23 DIAGNOSIS — Z7982 Long term (current) use of aspirin: Secondary | ICD-10-CM | POA: Diagnosis not present

## 2018-10-23 LAB — COMPREHENSIVE METABOLIC PANEL
ALT: 15 U/L (ref 0–44)
AST: 21 U/L (ref 15–41)
Albumin: 3.3 g/dL — ABNORMAL LOW (ref 3.5–5.0)
Alkaline Phosphatase: 52 U/L (ref 38–126)
Anion gap: 9 (ref 5–15)
BUN: 16 mg/dL (ref 8–23)
CO2: 21 mmol/L — ABNORMAL LOW (ref 22–32)
Calcium: 8.3 mg/dL — ABNORMAL LOW (ref 8.9–10.3)
Chloride: 106 mmol/L (ref 98–111)
Creatinine, Ser: 1.16 mg/dL — ABNORMAL HIGH (ref 0.44–1.00)
GFR calc non Af Amer: 48 mL/min — ABNORMAL LOW (ref >60)
GFR, EST AFRICAN AMERICAN: 55 mL/min — AB (ref >60)
Glucose, Bld: 97 mg/dL (ref 70–99)
POTASSIUM: 4 mmol/L (ref 3.5–5.1)
Sodium: 136 mmol/L (ref 135–145)
Total Bilirubin: 0.5 mg/dL (ref 0.3–1.2)
Total Protein: 6.1 g/dL — ABNORMAL LOW (ref 6.5–8.1)

## 2018-10-23 LAB — ECHOCARDIOGRAM COMPLETE
Height: 66 in
WEIGHTICAEL: 3136 [oz_av]

## 2018-10-23 LAB — TROPONIN I: Troponin I: <0.03 ng/mL (ref <0.03)

## 2018-10-23 LAB — CBC
HCT: 41.1 % (ref 36.0–46.0)
Hemoglobin: 13.2 g/dL (ref 12.0–15.0)
MCH: 30.7 pg (ref 26.0–34.0)
MCHC: 32.1 g/dL (ref 30.0–36.0)
MCV: 95.6 fL (ref 80.0–100.0)
Platelets: 362 10*3/uL (ref 150–400)
RBC: 4.3 MIL/uL (ref 3.87–5.11)
RDW: 13.9 % (ref 11.5–15.5)
WBC: 8.4 10*3/uL (ref 4.0–10.5)
nRBC: 0 % (ref 0.0–0.2)

## 2018-10-23 MED ORDER — NAPROXEN 500 MG PO TABS: 250.0000 mg | ORAL_TABLET | Freq: Every day | ORAL | Status: DC | PRN

## 2018-10-23 MED ORDER — LISINOPRIL 20 MG PO TABS: 10.0000 mg | ORAL_TABLET | Freq: Every day | ORAL | Status: DC

## 2018-10-23 MED ORDER — TRIAMTERENE-HCTZ 37.5-25 MG PO TABS: 1.0000 | ORAL_TABLET | Freq: Every morning | ORAL | Status: DC

## 2018-10-23 MED ORDER — TECHNETIUM TO 99M ALBUMIN AGGREGATED
4.0000 | Freq: Once | INTRAVENOUS | Status: AC | PRN
  Administered 2018-10-23: 3.9 via INTRAVENOUS

## 2018-10-23 MED ORDER — TECHNETIUM TC 99M DIETHYLENETRIAME-PENTAACETIC ACID
30.0000 | Freq: Once | INTRAVENOUS | Status: AC | PRN
  Administered 2018-10-23: 31 via RESPIRATORY_TRACT

## 2018-10-23 MED ORDER — LISINOPRIL 20 MG PO TABS: 20.0000 mg | ORAL_TABLET | Freq: Every day | ORAL | Status: DC

## 2018-10-23 NOTE — Care Management (Signed)
CM consulted for home O2. Pt has no chronic resp diagnosis. Pt dropped to less than 86% for a few seconds and per RN this may have been due to the probe moving on her finger. Pt was her last week and did not qualify at that time either. CM explained she would have to pay OOP d/t not having a chronic resp diagnosis and pt okay with going home with out O2. Pt plans to follow up closely with PCP.

## 2018-10-23 NOTE — Progress Notes (Signed)
This patient has received 81 ml's of IV Omnipaque contrast extravasation into left ac during a CT Angio Chest exam.  The exam was performed on 10/23/2018  Site / affected area assessed by Dr. Kathrynn Humble

## 2018-10-23 NOTE — Progress Notes (Signed)
*  PRELIMINARY RESULTS* Echocardiogram 2D Echocardiogram has been performed.  Leavy Cella 10/23/2018, 11:22 AM

## 2018-10-23 NOTE — Discharge Summary (Signed)
Physician Discharge Summary  Hannah Reynolds:[id number] DOB: [birth year] DOA: 10/22/2018  PCP: Celene Squibb, MD GI: Rehman  Admit date: 10/22/2018 Discharge date: 10/23/2018  Admitted From:  Home  Disposition: Home   Recommendations for Outpatient Follow-up:  1. Follow up with PCP in 1 weeks 2. Follow up with cardiology in 2 weeks regarding SOB/dyspnea symptoms 3. Follow up with Gyn in 2 weeks regarding endometrial thickening 4. Follow up with GI in 2-4 weeks regarding scheduling of colonoscopy  Discharge Condition: STABLE   CODE STATUS: FULL    Brief Hospitalization Summary: Please see all hospital notes, images, labs for full details of the hospitalization.  HPI:    Hannah Reynolds  is a 73 y.o. female, with a past medical history of CAD, hyperlipidemia, hypertension, GERD who was recently married for colitis and was discharged on 10/17/2018.  Patient says that on Saturday she started coughing and also developed shortness of breath.  This morning when she checked pulse ox her O2 sats were 89% on room air.Patient states that her GI symptoms have improved.  She denies nausea vomiting or diarrhea. She denies coughing up any phlegm.  Denies chest pain.  Denies abdominal pain.  In the ED, initial lactic acid was 2.1 patient was started on vancomycin and cefepime empirically. CTA was ordered but could not be done as IV infiltrated.  ED physician ordered CT chest and abdomen without contrast, due to elevated lactic acid but all the tests came back negative.  No previous history of stroke or seizures.  Denies passing out.  No dizziness.  The patient was admitted for further observation and management and work-up.  She had a CT of her chest and abdomen without any significant abnormalities found.  She was unable to get a CT angiogram due to poor IV access.  She did have a VQ scan done on 10/23/2018 which did not show any findings to suggest pulmonary embolism.  She did have some noticeable  atelectasis which was treated with incentive spirometry.  She responded to supplemental oxygen.  She was initially placed on IV antibiotics however will discontinue these at discharge as there are no signs of infection identified.  The patient had a 2D echocardiogram with results noted below but no significant findings to explain her symptoms.  Given that she has remained stable will obtain a home O2 eval screen and sent home on oxygen if necessary however if she does not meet criteria would not sent home on supplemental oxygen.  Her other medical problems have remained stable and she will resume her home blood pressure medication regimen and home cardiac medications.  Outpatient follow-up with cardiology recommended given her history of CAD.  Also she was instructed to follow-up with gynecology as recommended at last admission for endometrial thickening.  In addition, recommend she follow-up with GI to schedule colonoscopy.  Follow-up with primary care provider next week for recheck.  Discharge Diagnoses:  Active Problems:   Hyperlipidemia   Generalized anxiety disorder   Glaucoma   Essential hypertension   GERD (gastroesophageal reflux disease)   Dyspnea   SOB (shortness of breath)   Hypotension   CAD (coronary artery disease)   Discharge Instructions: Discharge Instructions    Call MD for:  difficulty breathing, headache or visual disturbances   Complete by:  As directed    Call MD for:  extreme fatigue   Complete by:  As directed    Call MD for:  persistant nausea and vomiting   Complete by:  As directed    Call MD for:  severe uncontrolled pain   Complete by:  As directed    Diet - low sodium heart healthy   Complete by:  As directed    Increase activity slowly   Complete by:  As directed      Allergies as of 10/23/2018   No Known Allergies     Medication List    TAKE these medications   ALPRAZolam 0.5 MG tablet Commonly known as:  XANAX TAKE (1) TABLET BY MOUTH TWICE  DAILY AS NEEDED FOR ANXIETY. What changed:    how much to take  how to take this  when to take this  additional instructions   aspirin EC 81 MG tablet Take 81 mg by mouth every morning.   Butalbital-APAP-Caffeine 50-300-40 MG Caps Take 1 capsule by mouth 2 (two) times daily as needed (migraine headache).   ciprofloxacin 500 MG tablet Commonly known as:  CIPRO Take 1 tablet (500 mg total) by mouth 2 (two) times daily for 8 days.   isosorbide mononitrate 30 MG 24 hr tablet Commonly known as:  IMDUR Take 1 tablet (30 mg total) by mouth daily. What changed:  when to take this   lisinopril 20 MG tablet Commonly known as:  PRINIVIL,ZESTRIL Take 1 tablet (20 mg total) by mouth at bedtime.   metroNIDAZOLE 500 MG tablet Commonly known as:  FLAGYL Take 1 tablet (500 mg total) by mouth every 8 (eight) hours for 8 days.   naproxen 500 MG tablet Commonly known as:  NAPROSYN Take 0.5 tablets (250 mg total) by mouth daily as needed for mild pain or moderate pain (knee pain). What changed:  See the new instructions.   simvastatin 20 MG tablet Commonly known as:  ZOCOR Take 1 tablet (20 mg total) by mouth daily at 6 PM.   timolol 0.5 % ophthalmic solution Commonly known as:  BETIMOL Place 1 drop into both eyes daily.   triamterene-hydrochlorothiazide 37.5-25 MG tablet Commonly known as:  MAXZIDE-25 Take 1 tablet by mouth every morning. What changed:  See the new instructions.   Ventolin HFA 108 (90 Base) MCG/ACT inhaler Generic drug:  albuterol Inhale 1-2 puffs into the lungs every 6 (six) hours as needed for wheezing or shortness of breath.   verapamil 180 MG CR tablet Commonly known as:  CALAN-SR Take 180 mg by mouth at bedtime.      Follow-up Information    Celene Squibb, MD. Schedule an appointment as soon as possible for a visit in 1 week(s).   Specialty:  Internal Medicine Contact information: [place] [facility] [address] [phone]         Rogene Houston, MD. Schedule an appointment as soon as possible for a visit in 2 week(s).   Specialty:  Gastroenterology Contact information: [address], [address] [place] [address] [phone]        Jonnie Kind, MD. Schedule an appointment as soon as possible for a visit in 2 week(s).   Specialties:  Obstetrics and Gynecology, Radiology Why:  Hospital Follow Up  Contact information: [place] [address] [phone]        Herminio Commons, MD. Schedule an appointment as soon as possible for a visit in 2 week(s).   Specialty:  Cardiology Why:  Establish care for cardiology Contact information: [address] [place] [address] [phone]          No Known  Allergies Allergies as of 10/23/2018   No Known Allergies     Medication List    TAKE these medications   ALPRAZolam 0.5 MG tablet Commonly known as:  XANAX TAKE (1) TABLET BY MOUTH TWICE DAILY AS NEEDED FOR ANXIETY. What changed:    how much to take  how to take this  when to take this  additional instructions   aspirin EC 81 MG tablet Take 81 mg by mouth every morning.   Butalbital-APAP-Caffeine 50-300-40 MG Caps Take 1 capsule by mouth 2 (two) times daily as needed (migraine headache).   ciprofloxacin 500 MG tablet Commonly known as:  CIPRO Take 1 tablet (500 mg total) by mouth 2 (two) times daily for 8 days.   isosorbide mononitrate 30 MG 24 hr tablet Commonly known as:  IMDUR Take 1 tablet (30 mg total) by mouth daily. What changed:  when to take this   lisinopril 20 MG tablet Commonly known as:  PRINIVIL,ZESTRIL Take 1 tablet (20 mg total) by mouth at bedtime.   metroNIDAZOLE 500 MG tablet Commonly known as:  FLAGYL Take 1 tablet (500 mg total) by mouth every 8 (eight) hours for 8 days.   naproxen 500 MG tablet Commonly known as:  NAPROSYN Take 0.5 tablets (250 mg total) by mouth daily as needed for mild pain or moderate pain (knee  pain). What changed:  See the new instructions.   simvastatin 20 MG tablet Commonly known as:  ZOCOR Take 1 tablet (20 mg total) by mouth daily at 6 PM.   timolol 0.5 % ophthalmic solution Commonly known as:  BETIMOL Place 1 drop into both eyes daily.   triamterene-hydrochlorothiazide 37.5-25 MG tablet Commonly known as:  MAXZIDE-25 Take 1 tablet by mouth every morning. What changed:  See the new instructions.   Ventolin HFA 108 (90 Base) MCG/ACT inhaler Generic drug:  albuterol Inhale 1-2 puffs into the lungs every 6 (six) hours as needed for wheezing or shortness of breath.   verapamil 180 MG CR tablet Commonly known as:  CALAN-SR Take 180 mg by mouth at bedtime.       Procedures/Studies:  2D Echocardiogram 10/23/18: Indications:    Dyspnea 786.09 / R06.00   History:        Patient has no prior history of Echocardiogram examinations.                 CAD; Risk Factors: Non-Smoker, Dyslipidemia and Hypertension.                 Anxiety Disorder, GERD.   Sonographer:    Leavy Cella RDCS (AE) Referring Phys: [place]   1. The left ventricle has normal systolic function with an ejection fraction of 60-65%. The cavity size was normal. Left ventricular diastolic Doppler parameters are consistent with impaired relaxation. No evidence of left ventricular regional wall  motion abnormalities.  2. The right ventricle has normal systolic function. The cavity was normal. There is no increase in right ventricular wall thickness.  3. The tricuspid valve is grossly normal.  4. The aortic valve is tricuspid.  5. The aortic root is normal in size and structure.  6. The interatrial septum was not well visualized.  FINDINGS  Left Ventricle: The left ventricle has normal systolic function, with an ejection fraction of 60-65%. The cavity size was normal. There is borderline increase in left ventricular wall thickness. Left ventricular diastolic Doppler  parameters are  consistent with impaired relaxation. Normal left ventricular filling  pressures No evidence of left ventricular regional wall motion abnormalities.. Right Ventricle: The right ventricle has normal systolic function. The cavity was normal. There is no increase in right ventricular wall thickness. Left Atrium: left atrial size was normal in size Right Atrium: right atrial size was normal in size. Right atrial pressure is estimated at 3 mmHg. Interatrial Septum: The interatrial septum was not well visualized. Pericardium: There is no evidence of pericardial effusion. Mitral Valve: The mitral valve is normal in structure. Mitral valve regurgitation is not visualized by color flow Doppler. Tricuspid Valve: The tricuspid valve is grossly normal. Tricuspid valve regurgitation was not visualized by color flow Doppler. Aortic Valve: The aortic valve is tricuspid Aortic valve regurgitation was not visualized by color flow Doppler. There is no evidence of aortic valve stenosis. Pulmonic Valve: The pulmonic valve was not well visualized. Pulmonic valve regurgitation is not visualized by color flow Doppler. Aorta: The aortic root is normal in size and structure. Venous: The inferior vena cava was not well visualized.  Ct Abdomen Pelvis Wo Contrast  Result Date: 10/22/2018 CLINICAL DATA:  73 year old female with shortness of breath, chest tightness and hypotension. Recently hospitalized. CTA was attempted but aborted due to left antecubital contrast extravasation. EXAM: CT CHEST, ABDOMEN AND PELVIS WITHOUT CONTRAST TECHNIQUE: Multidetector CT imaging of the chest, abdomen and pelvis was performed following the standard protocol without IV contrast. CONTRAST:  Attempted contrast administration oral air resulting in left upper extremity extravasation. Refer to the EMR for details. COMPARISON:  Recent CT Abdomen and Pelvis 10/15/2018 FINDINGS: CT CHEST FINDINGS Cardiovascular: No cardiomegaly or  pericardial effusion. Mild Calcified aortic atherosclerosis. No calcified coronary artery atherosclerosis is evident. Vascular patency is not evaluated in the absence of IV contrast. Mediastinum/Nodes: Negative. No mediastinal lymphadenopathy. Lungs/Pleura: Major airways are patent. Mild dependent atelectasis in both costophrenic angles greater on the left. Mild superimposed linear scarring or atelectasis in the lateral basal segment of the left lower lobe. Lower lung volumes are improved compared to the recent CT Abdomen and Pelvis. No pleural effusion or other abnormal pulmonary opacity. Musculoskeletal: Exaggerated thoracic kyphosis with multilevel midthoracic ankylosis related to flowing osteophytes or syndesmophytes. No acute or suspicious osseous abnormality. CT ABDOMEN PELVIS FINDINGS Hepatobiliary: Cholelithiasis without pericholecystic inflammation. Stable liver. Pancreas: Stable, better described on the recent contrast enhanced CT Abdomen and Pelvis. Spleen: Stable and negative. Adrenals/Urinary Tract: Normal adrenal glands. Contrast from the earlier extravasation is being excreted from both kidneys symmetrically with no hydronephrosis or hydroureter. The urinary bladder is moderately distended with combined contrast and urine but otherwise unremarkable. Stomach/Bowel: Resolved large bowel wall thickening and mild mesenteric inflammation. Interval increased retained stool throughout the colon, moderate overall. Previous right hemicolectomy with right abdominal anastomosis, no adverse features. No dilated small bowel. Negative stomach and duodenum. No free air, free fluid. Vascular/Lymphatic: Aortoiliac calcified atherosclerosis. Vascular patency is not evaluated in the absence of IV contrast. No lymphadenopathy. Reproductive: Grossly stable; better evaluated on the recent contrast enhanced CT Abdomen and Pelvis. Other: No pelvic free fluid. Stable postoperative changes to the ventral abdominal wall.  Musculoskeletal: Stable visualized osseous structures. Lower lumbar facet degeneration. IMPRESSION: 1. Resolved large bowel inflammation since 10/15/2018. Interval increased retained stool throughout the colon. 2. No new abnormality in the abdomen or pelvis. Stable pancreatic and uterine findings from the recent contrast enhanced CT Abdomen and Pelvis, please see that report. 3. Atelectasis, but no other acute or inflammatory finding identified in the noncontrast chest. Mild pulmonary atelectasis. 4. IV contrast related to  the earlier contrast extravasation being excreted from both kidneys to the urinary bladder. Electronically Signed   By: Genevie Ann M.D.   On: 10/22/2018 19:47   Dg Chest 2 View  Result Date: 10/22/2018 CLINICAL DATA:  Shortness of breath and weakness. EXAM: CHEST - 2 VIEW COMPARISON:  October 15, 2018 FINDINGS: There is air under the left hemidiaphragm, likely within a mildly distended stomach. The heart, hila, mediastinum, lungs, and pleura are unchanged. IMPRESSION: Air under the left hemidiaphragm, which is mildly elevated, is thought to be within a mildly distended air-filled stomach. No other abnormalities. Electronically Signed   By: Dorise Bullion III M.D   On: 10/22/2018 13:24   Dg Chest 2 View  Result Date: 10/15/2018 CLINICAL DATA:  Nausea and vomiting. EXAM: CHEST - 2 VIEW COMPARISON:  Chest x-ray dated February 04, 2018. FINDINGS: The heart size and mediastinal contours are within normal limits. Both lungs are clear. The visualized skeletal structures are unremarkable. IMPRESSION: No active cardiopulmonary disease. Electronically Signed   By: Titus Dubin M.D.   On: 10/15/2018 10:50   Ct Chest Wo Contrast  Result Date: 10/22/2018 CLINICAL DATA:  73 year old female with shortness of breath, chest tightness and hypotension. Recently hospitalized. CTA was attempted but aborted due to left antecubital contrast extravasation. EXAM: CT CHEST, ABDOMEN AND PELVIS WITHOUT CONTRAST  TECHNIQUE: Multidetector CT imaging of the chest, abdomen and pelvis was performed following the standard protocol without IV contrast. CONTRAST:  Attempted contrast administration oral air resulting in left upper extremity extravasation. Refer to the EMR for details. COMPARISON:  Recent CT Abdomen and Pelvis 10/15/2018 FINDINGS: CT CHEST FINDINGS Cardiovascular: No cardiomegaly or pericardial effusion. Mild Calcified aortic atherosclerosis. No calcified coronary artery atherosclerosis is evident. Vascular patency is not evaluated in the absence of IV contrast. Mediastinum/Nodes: Negative. No mediastinal lymphadenopathy. Lungs/Pleura: Major airways are patent. Mild dependent atelectasis in both costophrenic angles greater on the left. Mild superimposed linear scarring or atelectasis in the lateral basal segment of the left lower lobe. Lower lung volumes are improved compared to the recent CT Abdomen and Pelvis. No pleural effusion or other abnormal pulmonary opacity. Musculoskeletal: Exaggerated thoracic kyphosis with multilevel midthoracic ankylosis related to flowing osteophytes or syndesmophytes. No acute or suspicious osseous abnormality. CT ABDOMEN PELVIS FINDINGS Hepatobiliary: Cholelithiasis without pericholecystic inflammation. Stable liver. Pancreas: Stable, better described on the recent contrast enhanced CT Abdomen and Pelvis. Spleen: Stable and negative. Adrenals/Urinary Tract: Normal adrenal glands. Contrast from the earlier extravasation is being excreted from both kidneys symmetrically with no hydronephrosis or hydroureter. The urinary bladder is moderately distended with combined contrast and urine but otherwise unremarkable. Stomach/Bowel: Resolved large bowel wall thickening and mild mesenteric inflammation. Interval increased retained stool throughout the colon, moderate overall. Previous right hemicolectomy with right abdominal anastomosis, no adverse features. No dilated small bowel. Negative  stomach and duodenum. No free air, free fluid. Vascular/Lymphatic: Aortoiliac calcified atherosclerosis. Vascular patency is not evaluated in the absence of IV contrast. No lymphadenopathy. Reproductive: Grossly stable; better evaluated on the recent contrast enhanced CT Abdomen and Pelvis. Other: No pelvic free fluid. Stable postoperative changes to the ventral abdominal wall. Musculoskeletal: Stable visualized osseous structures. Lower lumbar facet degeneration. IMPRESSION: 1. Resolved large bowel inflammation since 10/15/2018. Interval increased retained stool throughout the colon. 2. No new abnormality in the abdomen or pelvis. Stable pancreatic and uterine findings from the recent contrast enhanced CT Abdomen and Pelvis, please see that report. 3. Atelectasis, but no other acute or inflammatory finding identified in the  noncontrast chest. Mild pulmonary atelectasis. 4. IV contrast related to the earlier contrast extravasation being excreted from both kidneys to the urinary bladder. Electronically Signed   By: Genevie Ann M.D.   On: 10/22/2018 19:47   Ct Abdomen Pelvis W Contrast  Addendum Date: 10/16/2018   ADDENDUM REPORT: 10/16/2018 13:41 ADDENDUM: Although mentioned in the body of the report, the presence of abnormal endometrial thickening and enhancing soft tissue was omitted from the original impression section. Accordingly, this was discussed by phone with Dr. Dyann Kief on 10/16/2018 at 1:35 p.m., and recommendation for nonemergent outpatient follow-up with OB-Gyn as well as further evaluation with nonemergent transvaginal ultrasound in the near future was made. Electronically Signed   By: Vinnie Langton M.D.   On: 10/16/2018 13:41   Result Date: 10/16/2018 CLINICAL DATA:  73 year old female with history of diarrhea since yesterday evening with small amount of blood in stool. Three episodes of vomiting. EXAM: CT ABDOMEN AND PELVIS WITH CONTRAST TECHNIQUE: Multidetector CT imaging of the abdomen and pelvis  was performed using the standard protocol following bolus administration of intravenous contrast. CONTRAST:  149mL OMNIPAQUE IOHEXOL 300 MG/ML  SOLN COMPARISON:  CT the abdomen and pelvis 05/19/2012. FINDINGS: Lower chest: Scattered areas of mild scarring are again noted throughout the lung bases bilaterally, similar to the prior study. Atherosclerotic calcifications in the descending thoracic aorta. Hepatobiliary: No suspicious cystic or solid hepatic lesions. No intra or extrahepatic biliary ductal dilatation. Small calcified gallstones lying dependently in the gallbladder. No findings to suggest an acute cholecystitis at this time. Pancreas: In the proximal body of the pancreas there is a 1.7 x 1.4 cm low-attenuation lesion (axial image 31 of series 2) which has increased slightly in size compared to remote prior study from 05/19/2012 (previously 12 x 14 mm), considered stable in size per current imaging guidelines. No other new suspicious appearing pancreatic mass. No pancreatic ductal dilatation. No peripancreatic fluid collections or inflammatory changes. Spleen: Unremarkable. Adrenals/Urinary Tract: Bilateral kidneys and bilateral adrenal glands are normal in appearance. No hydroureteronephrosis. Urinary bladder is normal in appearance. Stomach/Bowel: Normal appearance of the stomach. No pathologic dilatation of small bowel or colon. Extensive mural thickening in the colon, most severe in the region of the splenic flexure and descending colon where there are surrounding inflammatory changes and there is some mucosal hyperenhancement. In addition, there are several colonic diverticulae are noted, particularly in the sigmoid colon, without definite focal surrounding inflammatory changes. Status post right hemicolectomy. Vascular/Lymphatic: Aortic atherosclerosis, without evidence of aneurysm or dissection in the abdominal or pelvic vasculature. Circumaortic left renal vein (normal anatomical variant)  incidentally noted. No lymphadenopathy identified in the abdomen or pelvis. Reproductive: Endometrium appears thickened measuring up to 21 mm, and there is irregularity and apparent enhancing soft tissue in the endometrial cavity. Ovaries are atrophic. Other: No significant volume of ascites.  No pneumoperitoneum. Musculoskeletal: There are no aggressive appearing lytic or blastic lesions noted in the visualized portions of the skeleton. IMPRESSION: 1. Profound mural thickening, mucosal hyperenhancement and surrounding inflammatory changes in the colon, most evident in the splenic flexure and descending colon, likely to reflect an acute colitis. 2. There are several colonic diverticulae, most evident in the sigmoid colon, without definite focal surrounding inflammatory changes to clearly indicate an acute diverticulitis. 3. Stable cystic lesion in the proximal body of the pancreas when compared to prior study from 2013. This is strongly favored to be benign. At this time, 6 and a half years of stability has been established. Repeat  pancreatic protocol CT or abdominal MRI with and without IV gadolinium with MRCP is recommended in 2 years to ensure continued stability. This recommendation follows ACR consensus guidelines: Management of Incidental Pancreatic Cysts: A White Paper of the ACR Incidental Findings Committee. [place] 2774;[id number]. 4. Cholelithiasis without evidence of acute cholecystitis at this time. 5. Aortic atherosclerosis. Electronically Signed: By: Vinnie Langton M.D. On: 10/15/2018 10:44   Nm Pulmonary Perf And Vent  Result Date: 10/23/2018 CLINICAL DATA:  Shortness of breath EXAM: NUCLEAR MEDICINE VENTILATION - PERFUSION LUNG SCAN VIEWS: Anterior, posterior, left lateral, right lateral, RPO, LPO, RAO, LAO-ventilation and perfusion RADIOPHARMACEUTICALS:  31.0 mCi of Tc-66m DTPA aerosol inhalation and 3.9 mCi Tc43m MAA IV COMPARISON:  Chest radiograph and chest CT October 22, 2018  FINDINGS: Ventilation: Radiotracer uptake is homogeneous and symmetric bilaterally. No focal ventilation defects are evident. Perfusion: Radiotracer uptake is homogeneous and symmetric bilaterally. No appreciable perfusion defects. IMPRESSION: No appreciable ventilation or perfusion defects. Very low probability of pulmonary embolus. Electronically Signed   By: Lowella Grip III M.D.   On: 10/23/2018 09:35     Subjective: Pt says she feels much better especially on the oxygen.  She has no cough, SOB, chest pain or fever.   Discharge Exam: Vitals:   10/22/18 2100 10/22/18 2218  BP: (!) 120/59 106/73  Pulse: 66 73  Resp:  20  Temp:  99.2 F (37.3 C)  SpO2: 99% 100%   Vitals:   10/22/18 2000 10/22/18 2030 10/22/18 2100 10/22/18 2218  BP: 125/60 111/62 (!) 120/59 106/73  Pulse: 81 66 66 73  Resp:    20  Temp:    99.2 F (37.3 C)  TempSrc:    Oral  SpO2: 97% 97% 99% 100%  Weight:      Height:       General: Pt is alert, awake, not in acute distress Cardiovascular: RRR, S1/S2 +, no rubs, no gallops Respiratory: CTA bilaterally, no wheezing, no rhonchi Abdominal: Soft, NT, ND, bowel sounds + Extremities: no edema, no cyanosis   The results of significant diagnostics from this hospitalization (including imaging, microbiology, ancillary and laboratory) are listed below for reference.     Microbiology: Recent Results (from the past 240 hour(s))  Culture, Urine     Status: Abnormal   Collection Time: 10/15/18  9:27 AM  Result Value Ref Range Status   Specimen Description   Final    URINE, CLEAN CATCH Performed at [facility], [address], [place], [place] [address]    Special Requests   Final    NONE Performed at [facility], [address] [place],  [address]    Culture MULTIPLE SPECIES PRESENT, SUGGEST RECOLLECTION (A)  Final   Report Status 10/16/2018 FINAL  Final     Labs: BNP (last 3 results) Recent Labs    10/22/18 1230  BNP 83.6   Basic  Metabolic Panel: Recent Labs  Lab 10/17/18 0438 10/22/18 1230 10/23/18 0451  NA 139 137 136  K 3.7 3.3* 4.0  CL 112* 103 106  CO2 23 22 21*  GLUCOSE 92 118* 97  BUN 7* 15 16  CREATININE 0.92 1.16* 1.16*  CALCIUM 7.9* 9.3 8.3*   Liver Function Tests: Recent Labs  Lab 10/23/18 0451  AST 21  ALT 15  ALKPHOS 52  BILITOT 0.5  PROT 6.1*  ALBUMIN 3.3*   No results for input(s): LIPASE, AMYLASE in the last 168 hours. No results for input(s): AMMONIA in the last  168 hours. CBC: Recent Labs  Lab 10/17/18 0438 10/22/18 1230 10/23/18 0608  WBC 6.8 7.9 8.4  NEUTROABS  --  5.4  --   HGB 9.7* 13.4 13.2  HCT 31.5* 41.7 41.1  MCV 96.3 94.3 95.6  PLT 190 404* 362   Cardiac Enzymes: Recent Labs  Lab 10/22/18 1230 10/22/18 2227 10/23/18 0608 10/23/18 0955  TROPONINI <0.03 <0.03 <0.03 <0.03   BNP: Invalid input(s): POCBNP CBG: No results for input(s): GLUCAP in the last 168 hours. D-Dimer No results for input(s): DDIMER in the last 72 hours. Hgb A1c No results for input(s): HGBA1C in the last 72 hours. Lipid Profile No results for input(s): CHOL, HDL, LDLCALC, TRIG, CHOLHDL, LDLDIRECT in the last 72 hours. Thyroid function studies No results for input(s): TSH, T4TOTAL, T3FREE, THYROIDAB in the last 72 hours.  Invalid input(s): FREET3 Anemia work up No results for input(s): VITAMINB12, FOLATE, FERRITIN, TIBC, IRON, RETICCTPCT in the last 72 hours. Urinalysis    Component Value Date/Time   COLORURINE YELLOW 10/15/2018 0927   APPEARANCEUR CLOUDY (A) 10/15/2018 0927   LABSPEC 1.019 10/15/2018 0927   PHURINE 6.0 10/15/2018 0927   GLUCOSEU NEGATIVE 10/15/2018 0927   HGBUR SMALL (A) 10/15/2018 0927   BILIRUBINUR NEGATIVE 10/15/2018 0927   KETONESUR NEGATIVE 10/15/2018 0927   PROTEINUR NEGATIVE 10/15/2018 0927   NITRITE NEGATIVE 10/15/2018 0927   LEUKOCYTESUR SMALL (A) 10/15/2018 0927   Sepsis Labs Invalid input(s): PROCALCITONIN,  WBC,   LACTICIDVEN Microbiology Recent Results (from the past 240 hour(s))  Culture, Urine     Status: Abnormal   Collection Time: 10/15/18  9:27 AM  Result Value Ref Range Status   Specimen Description   Final    URINE, CLEAN CATCH Performed at [facility], [address], [place], [place]  [address]    Special Requests   Final    NONE Performed at [facility], [address], [place],  [address]    Culture MULTIPLE SPECIES PRESENT, SUGGEST RECOLLECTION (A)  Final   Report Status 10/16/2018 FINAL  Final    Time coordinating discharge: 32 minutes   SIGNED:  Irwin Brakeman, MD  [facility] 10/23/2018, 1:04 PM How to contact the [facility] Attending or Consulting provider [place] or covering provider during after hours [place], for this patient?  1. Check the care team in [facility] and look for a) attending/consulting [facility] provider listed and b) the [facility] team listed 2. Log into [email or web address] and use [place]'s universal password to access. If you do not have the password, please contact the hospital operator. 3. Locate the [facility] provider you are looking for under [facility] and page to a number that you can be directly reached. 4. If you still have difficulty reaching the provider, please page the [facility] (Director on Call) for the Hospitalists listed on amion for assistance.

## 2018-10-23 NOTE — Progress Notes (Signed)
Ambulated patient around unit, tolerated well with just assistance with cane. Oxygen saturation stayed around 95% on room air and dropped once to 86% but came back up within a minute.

## 2018-10-23 NOTE — Discharge Instructions (Signed)
IMPORTANT INFORMATION: PAY CLOSE ATTENTION   PHYSICIAN DISCHARGE INSTRUCTIONS  Follow with Primary care provider  Celene Squibb, MD  and other consultants as instructed your Hospitalist Physician  [place] IF SYMPTOMS COME BACK, WORSEN OR NEW PROBLEM DEVELOPS.   Please note: You were cared for by a hospitalist during your hospital stay. Every effort will be made to forward records to your primary care provider.  You can request that your primary care provider send for your hospital records if they have not received them.  Once you are discharged, your primary care physician will handle any further medical issues. Please note that NO REFILLS for any discharge medications will be authorized once you are discharged, as it is imperative that you return to your primary care physician (or establish a relationship with a primary care physician if you do not have one) for your post hospital discharge needs so that they can reassess your need for medications and monitor your lab values.  Please get a complete blood count and chemistry panel checked by your Primary MD at your next visit, and again as instructed by your Primary MD.  Get Medicines reviewed and adjusted: Please take all your medications with you for your next visit with your Primary MD  Laboratory/radiological data: Please request your Primary MD to go over all hospital tests and procedure/radiological results at the follow up, please ask your primary care provider to get all Hospital records sent to his/her office.  In some cases, they will be blood work, cultures and biopsy results pending at the time of your discharge. Please request that your primary care provider follow up on these results.  If you are diabetic, please bring your blood sugar readings with you to your follow up appointment with primary care.    Please call and make your follow up appointments as soon as possible.    Also Note the  following: If you experience worsening of your admission symptoms, develop shortness of breath, life threatening emergency, suicidal or homicidal thoughts you must seek medical attention immediately by calling 911 or calling your MD immediately  if symptoms less severe.  You must read complete instructions/literature along with all the possible adverse reactions/side effects for all the Medicines you take and that have been prescribed to you. Take any new Medicines after you have completely understood and accpet all the possible adverse reactions/side effects.   Do not drive when taking Pain medications or sleeping medications (Benzodiazepines)  Do not take more than prescribed Pain, Sleep and Anxiety Medications. It is not advisable to combine anxiety,sleep and pain medications without talking with your primary care practitioner  Special Instructions: If you have smoked or chewed Tobacco  in the last 2 yrs please stop smoking, stop any regular Alcohol  and or any Recreational drug use.  Wear Seat belts while driving.

## 2018-10-29 DIAGNOSIS — R0602 Shortness of breath: Secondary | ICD-10-CM | POA: Diagnosis not present

## 2018-10-29 DIAGNOSIS — I959 Hypotension, unspecified: Secondary | ICD-10-CM | POA: Diagnosis not present

## 2018-10-29 DIAGNOSIS — R5382 Chronic fatigue, unspecified: Secondary | ICD-10-CM | POA: Diagnosis not present

## 2018-11-05 DIAGNOSIS — R0602 Shortness of breath: Secondary | ICD-10-CM | POA: Diagnosis not present

## 2018-11-05 DIAGNOSIS — R5382 Chronic fatigue, unspecified: Secondary | ICD-10-CM | POA: Diagnosis not present

## 2018-11-05 DIAGNOSIS — R0902 Hypoxemia: Secondary | ICD-10-CM | POA: Diagnosis not present

## 2018-11-06 DIAGNOSIS — R0902 Hypoxemia: Secondary | ICD-10-CM | POA: Diagnosis not present

## 2018-11-12 DIAGNOSIS — I10 Essential (primary) hypertension: Secondary | ICD-10-CM | POA: Diagnosis not present

## 2018-11-29 DIAGNOSIS — I10 Essential (primary) hypertension: Secondary | ICD-10-CM | POA: Diagnosis not present

## 2018-12-20 DIAGNOSIS — F411 Generalized anxiety disorder: Secondary | ICD-10-CM | POA: Diagnosis not present

## 2018-12-20 DIAGNOSIS — E782 Mixed hyperlipidemia: Secondary | ICD-10-CM | POA: Diagnosis not present

## 2018-12-20 DIAGNOSIS — I959 Hypotension, unspecified: Secondary | ICD-10-CM | POA: Diagnosis not present

## 2018-12-20 DIAGNOSIS — F419 Anxiety disorder, unspecified: Secondary | ICD-10-CM | POA: Diagnosis not present

## 2018-12-20 DIAGNOSIS — I10 Essential (primary) hypertension: Secondary | ICD-10-CM | POA: Diagnosis not present

## 2018-12-20 DIAGNOSIS — E785 Hyperlipidemia, unspecified: Secondary | ICD-10-CM | POA: Diagnosis not present

## 2019-01-02 DIAGNOSIS — E785 Hyperlipidemia, unspecified: Secondary | ICD-10-CM | POA: Diagnosis not present

## 2019-01-02 DIAGNOSIS — E782 Mixed hyperlipidemia: Secondary | ICD-10-CM | POA: Diagnosis not present

## 2019-01-02 DIAGNOSIS — I10 Essential (primary) hypertension: Secondary | ICD-10-CM | POA: Diagnosis not present

## 2019-01-08 ENCOUNTER — Other Ambulatory Visit (HOSPITAL_COMMUNITY): Payer: Self-pay | Admitting: Internal Medicine

## 2019-01-08 DIAGNOSIS — Z1231 Encounter for screening mammogram for malignant neoplasm of breast: Secondary | ICD-10-CM

## 2019-01-08 DIAGNOSIS — J9611 Chronic respiratory failure with hypoxia: Secondary | ICD-10-CM | POA: Diagnosis not present

## 2019-01-08 DIAGNOSIS — I10 Essential (primary) hypertension: Secondary | ICD-10-CM | POA: Diagnosis not present

## 2019-01-08 DIAGNOSIS — F419 Anxiety disorder, unspecified: Secondary | ICD-10-CM | POA: Diagnosis not present

## 2019-01-08 DIAGNOSIS — E785 Hyperlipidemia, unspecified: Secondary | ICD-10-CM | POA: Diagnosis not present

## 2019-01-25 DIAGNOSIS — F419 Anxiety disorder, unspecified: Secondary | ICD-10-CM | POA: Diagnosis not present

## 2019-01-25 DIAGNOSIS — I959 Hypotension, unspecified: Secondary | ICD-10-CM | POA: Diagnosis not present

## 2019-01-25 DIAGNOSIS — F411 Generalized anxiety disorder: Secondary | ICD-10-CM | POA: Diagnosis not present

## 2019-01-25 DIAGNOSIS — E782 Mixed hyperlipidemia: Secondary | ICD-10-CM | POA: Diagnosis not present

## 2019-01-25 DIAGNOSIS — I10 Essential (primary) hypertension: Secondary | ICD-10-CM | POA: Diagnosis not present

## 2019-01-25 DIAGNOSIS — E785 Hyperlipidemia, unspecified: Secondary | ICD-10-CM | POA: Diagnosis not present

## 2019-03-14 ENCOUNTER — Other Ambulatory Visit: Payer: Self-pay

## 2019-06-25 ENCOUNTER — Other Ambulatory Visit: Payer: Self-pay | Admitting: Orthopedic Surgery

## 2019-07-01 DIAGNOSIS — H40023 Open angle with borderline findings, high risk, bilateral: Secondary | ICD-10-CM | POA: Diagnosis not present

## 2019-07-01 DIAGNOSIS — Z961 Presence of intraocular lens: Secondary | ICD-10-CM | POA: Diagnosis not present

## 2019-07-04 DIAGNOSIS — E785 Hyperlipidemia, unspecified: Secondary | ICD-10-CM | POA: Diagnosis not present

## 2019-07-04 DIAGNOSIS — I959 Hypotension, unspecified: Secondary | ICD-10-CM | POA: Diagnosis not present

## 2019-07-04 DIAGNOSIS — I10 Essential (primary) hypertension: Secondary | ICD-10-CM | POA: Diagnosis not present

## 2019-07-04 DIAGNOSIS — E782 Mixed hyperlipidemia: Secondary | ICD-10-CM | POA: Diagnosis not present

## 2019-07-09 DIAGNOSIS — E785 Hyperlipidemia, unspecified: Secondary | ICD-10-CM | POA: Diagnosis not present

## 2019-07-09 DIAGNOSIS — Z6832 Body mass index (BMI) 32.0-32.9, adult: Secondary | ICD-10-CM | POA: Diagnosis not present

## 2019-07-09 DIAGNOSIS — E669 Obesity, unspecified: Secondary | ICD-10-CM | POA: Diagnosis not present

## 2019-07-09 DIAGNOSIS — F419 Anxiety disorder, unspecified: Secondary | ICD-10-CM | POA: Diagnosis not present

## 2019-07-09 DIAGNOSIS — I10 Essential (primary) hypertension: Secondary | ICD-10-CM | POA: Diagnosis not present

## 2019-07-30 DIAGNOSIS — I10 Essential (primary) hypertension: Secondary | ICD-10-CM | POA: Diagnosis not present

## 2019-07-30 DIAGNOSIS — J9611 Chronic respiratory failure with hypoxia: Secondary | ICD-10-CM | POA: Diagnosis not present

## 2019-09-02 ENCOUNTER — Other Ambulatory Visit: Payer: Self-pay | Admitting: Orthopedic Surgery

## 2019-09-03 DIAGNOSIS — Z961 Presence of intraocular lens: Secondary | ICD-10-CM | POA: Diagnosis not present

## 2019-09-03 DIAGNOSIS — H40023 Open angle with borderline findings, high risk, bilateral: Secondary | ICD-10-CM | POA: Diagnosis not present

## 2019-09-20 DIAGNOSIS — R197 Diarrhea, unspecified: Secondary | ICD-10-CM | POA: Diagnosis not present

## 2019-09-20 DIAGNOSIS — R0902 Hypoxemia: Secondary | ICD-10-CM | POA: Diagnosis not present

## 2019-10-06 DIAGNOSIS — E782 Mixed hyperlipidemia: Secondary | ICD-10-CM | POA: Diagnosis not present

## 2019-10-06 DIAGNOSIS — R300 Dysuria: Secondary | ICD-10-CM | POA: Diagnosis not present

## 2019-10-06 DIAGNOSIS — E785 Hyperlipidemia, unspecified: Secondary | ICD-10-CM | POA: Diagnosis not present

## 2019-10-06 DIAGNOSIS — R0602 Shortness of breath: Secondary | ICD-10-CM | POA: Diagnosis not present

## 2019-10-06 DIAGNOSIS — J9611 Chronic respiratory failure with hypoxia: Secondary | ICD-10-CM | POA: Diagnosis not present

## 2019-10-06 DIAGNOSIS — K921 Melena: Secondary | ICD-10-CM | POA: Diagnosis not present

## 2019-10-06 DIAGNOSIS — E669 Obesity, unspecified: Secondary | ICD-10-CM | POA: Diagnosis not present

## 2019-10-06 DIAGNOSIS — R0902 Hypoxemia: Secondary | ICD-10-CM | POA: Diagnosis not present

## 2019-10-06 DIAGNOSIS — Z6832 Body mass index (BMI) 32.0-32.9, adult: Secondary | ICD-10-CM | POA: Diagnosis not present

## 2019-10-06 DIAGNOSIS — I10 Essential (primary) hypertension: Secondary | ICD-10-CM | POA: Diagnosis not present

## 2019-10-06 DIAGNOSIS — F411 Generalized anxiety disorder: Secondary | ICD-10-CM | POA: Diagnosis not present

## 2019-10-06 DIAGNOSIS — K529 Noninfective gastroenteritis and colitis, unspecified: Secondary | ICD-10-CM | POA: Diagnosis not present

## 2019-10-06 DIAGNOSIS — B370 Candidal stomatitis: Secondary | ICD-10-CM | POA: Diagnosis not present

## 2019-10-22 DIAGNOSIS — Z23 Encounter for immunization: Secondary | ICD-10-CM | POA: Diagnosis not present

## 2019-11-07 DIAGNOSIS — L237 Allergic contact dermatitis due to plants, except food: Secondary | ICD-10-CM | POA: Diagnosis not present

## 2019-11-19 DIAGNOSIS — Z23 Encounter for immunization: Secondary | ICD-10-CM | POA: Diagnosis not present

## 2019-12-08 ENCOUNTER — Other Ambulatory Visit: Payer: Self-pay | Admitting: Orthopedic Surgery

## 2019-12-31 DIAGNOSIS — K529 Noninfective gastroenteritis and colitis, unspecified: Secondary | ICD-10-CM | POA: Diagnosis not present

## 2019-12-31 DIAGNOSIS — R0602 Shortness of breath: Secondary | ICD-10-CM | POA: Diagnosis not present

## 2019-12-31 DIAGNOSIS — E785 Hyperlipidemia, unspecified: Secondary | ICD-10-CM | POA: Diagnosis not present

## 2019-12-31 DIAGNOSIS — F411 Generalized anxiety disorder: Secondary | ICD-10-CM | POA: Diagnosis not present

## 2019-12-31 DIAGNOSIS — J9611 Chronic respiratory failure with hypoxia: Secondary | ICD-10-CM | POA: Diagnosis not present

## 2019-12-31 DIAGNOSIS — B370 Candidal stomatitis: Secondary | ICD-10-CM | POA: Diagnosis not present

## 2019-12-31 DIAGNOSIS — E669 Obesity, unspecified: Secondary | ICD-10-CM | POA: Diagnosis not present

## 2019-12-31 DIAGNOSIS — F419 Anxiety disorder, unspecified: Secondary | ICD-10-CM | POA: Diagnosis not present

## 2019-12-31 DIAGNOSIS — K921 Melena: Secondary | ICD-10-CM | POA: Diagnosis not present

## 2019-12-31 DIAGNOSIS — E782 Mixed hyperlipidemia: Secondary | ICD-10-CM | POA: Diagnosis not present

## 2019-12-31 DIAGNOSIS — I959 Hypotension, unspecified: Secondary | ICD-10-CM | POA: Diagnosis not present

## 2019-12-31 DIAGNOSIS — I10 Essential (primary) hypertension: Secondary | ICD-10-CM | POA: Diagnosis not present

## 2020-01-06 DIAGNOSIS — J9611 Chronic respiratory failure with hypoxia: Secondary | ICD-10-CM | POA: Diagnosis not present

## 2020-01-06 DIAGNOSIS — Z6832 Body mass index (BMI) 32.0-32.9, adult: Secondary | ICD-10-CM | POA: Diagnosis not present

## 2020-01-06 DIAGNOSIS — I10 Essential (primary) hypertension: Secondary | ICD-10-CM | POA: Diagnosis not present

## 2020-01-06 DIAGNOSIS — Z0001 Encounter for general adult medical examination with abnormal findings: Secondary | ICD-10-CM | POA: Diagnosis not present

## 2020-01-06 DIAGNOSIS — E669 Obesity, unspecified: Secondary | ICD-10-CM | POA: Diagnosis not present

## 2020-01-06 DIAGNOSIS — E785 Hyperlipidemia, unspecified: Secondary | ICD-10-CM | POA: Diagnosis not present

## 2020-01-06 DIAGNOSIS — F419 Anxiety disorder, unspecified: Secondary | ICD-10-CM | POA: Diagnosis not present

## 2020-02-24 DIAGNOSIS — H401321 Pigmentary glaucoma, left eye, mild stage: Secondary | ICD-10-CM | POA: Diagnosis not present

## 2020-02-24 DIAGNOSIS — H401311 Pigmentary glaucoma, right eye, mild stage: Secondary | ICD-10-CM | POA: Diagnosis not present

## 2020-06-09 DIAGNOSIS — Z23 Encounter for immunization: Secondary | ICD-10-CM | POA: Diagnosis not present

## 2020-07-07 DIAGNOSIS — Z6832 Body mass index (BMI) 32.0-32.9, adult: Secondary | ICD-10-CM | POA: Diagnosis not present

## 2020-07-07 DIAGNOSIS — F411 Generalized anxiety disorder: Secondary | ICD-10-CM | POA: Diagnosis not present

## 2020-07-07 DIAGNOSIS — E782 Mixed hyperlipidemia: Secondary | ICD-10-CM | POA: Diagnosis not present

## 2020-07-07 DIAGNOSIS — I10 Essential (primary) hypertension: Secondary | ICD-10-CM | POA: Diagnosis not present

## 2020-07-07 DIAGNOSIS — R0902 Hypoxemia: Secondary | ICD-10-CM | POA: Diagnosis not present

## 2020-07-07 DIAGNOSIS — B370 Candidal stomatitis: Secondary | ICD-10-CM | POA: Diagnosis not present

## 2020-07-07 DIAGNOSIS — R0602 Shortness of breath: Secondary | ICD-10-CM | POA: Diagnosis not present

## 2020-07-07 DIAGNOSIS — E785 Hyperlipidemia, unspecified: Secondary | ICD-10-CM | POA: Diagnosis not present

## 2020-07-07 DIAGNOSIS — J9611 Chronic respiratory failure with hypoxia: Secondary | ICD-10-CM | POA: Diagnosis not present

## 2020-07-07 DIAGNOSIS — K529 Noninfective gastroenteritis and colitis, unspecified: Secondary | ICD-10-CM | POA: Diagnosis not present

## 2020-07-07 DIAGNOSIS — K921 Melena: Secondary | ICD-10-CM | POA: Diagnosis not present

## 2020-07-07 DIAGNOSIS — E669 Obesity, unspecified: Secondary | ICD-10-CM | POA: Diagnosis not present

## 2020-07-12 DIAGNOSIS — E663 Overweight: Secondary | ICD-10-CM | POA: Diagnosis not present

## 2020-07-12 DIAGNOSIS — J9611 Chronic respiratory failure with hypoxia: Secondary | ICD-10-CM | POA: Diagnosis not present

## 2020-07-12 DIAGNOSIS — E569 Vitamin deficiency, unspecified: Secondary | ICD-10-CM | POA: Diagnosis not present

## 2020-07-12 DIAGNOSIS — I10 Essential (primary) hypertension: Secondary | ICD-10-CM | POA: Diagnosis not present

## 2020-07-12 DIAGNOSIS — F419 Anxiety disorder, unspecified: Secondary | ICD-10-CM | POA: Diagnosis not present

## 2020-07-12 DIAGNOSIS — E782 Mixed hyperlipidemia: Secondary | ICD-10-CM | POA: Diagnosis not present

## 2020-07-12 DIAGNOSIS — R7303 Prediabetes: Secondary | ICD-10-CM | POA: Diagnosis not present

## 2020-07-12 DIAGNOSIS — Z6829 Body mass index (BMI) 29.0-29.9, adult: Secondary | ICD-10-CM | POA: Diagnosis not present

## 2020-08-13 DIAGNOSIS — Z23 Encounter for immunization: Secondary | ICD-10-CM | POA: Diagnosis not present

## 2022-01-13 ENCOUNTER — Other Ambulatory Visit (HOSPITAL_COMMUNITY): Payer: Self-pay | Admitting: Adult Health Nurse Practitioner

## 2022-01-13 DIAGNOSIS — M8589 Other specified disorders of bone density and structure, multiple sites: Secondary | ICD-10-CM

## 2022-07-24 ENCOUNTER — Other Ambulatory Visit (HOSPITAL_COMMUNITY): Payer: Self-pay | Admitting: Adult Health Nurse Practitioner

## 2022-07-24 DIAGNOSIS — R310 Gross hematuria: Secondary | ICD-10-CM

## 2022-08-23 ENCOUNTER — Encounter (HOSPITAL_COMMUNITY): Payer: Self-pay | Admitting: Radiology

## 2022-08-23 ENCOUNTER — Ambulatory Visit (HOSPITAL_COMMUNITY)
Admission: RE | Admit: 2022-08-23 | Discharge: 2022-08-23 | Disposition: A | Discharge status: Home / Self Care | Payer: Medicare Other | Source: Ambulatory Visit | Attending: Adult Health Nurse Practitioner | Admitting: Adult Health Nurse Practitioner

## 2022-08-23 DIAGNOSIS — R310 Gross hematuria: Secondary | ICD-10-CM

## 2022-08-23 MED ORDER — IOHEXOL 300 MG/ML  SOLN
100.0000 mL | Freq: Once | INTRAMUSCULAR | Status: AC | PRN
  Administered 2022-08-23: 100 mL via INTRAVENOUS

## 2022-09-04 LAB — POCT I-STAT CREATININE: Creatinine, Ser: 0.8 mg/dL (ref 0.44–1.00)

## 2022-10-26 ENCOUNTER — Emergency Department (HOSPITAL_COMMUNITY)
Admission: EM | Admit: 2022-10-26 | Discharge: 2022-10-26 | Disposition: A | Discharge status: Home / Self Care | Payer: Medicare Other | Attending: Emergency Medicine | Admitting: Emergency Medicine

## 2022-10-26 ENCOUNTER — Encounter (HOSPITAL_COMMUNITY): Payer: Self-pay | Admitting: *Deleted

## 2022-10-26 ENCOUNTER — Other Ambulatory Visit: Payer: Self-pay

## 2022-10-26 ENCOUNTER — Emergency Department (HOSPITAL_COMMUNITY): Payer: Medicare Other

## 2022-10-26 DIAGNOSIS — A09 Infectious gastroenteritis and colitis, unspecified: Secondary | ICD-10-CM

## 2022-10-26 DIAGNOSIS — R197 Diarrhea, unspecified: Secondary | ICD-10-CM | POA: Diagnosis present

## 2022-10-26 DIAGNOSIS — E876 Hypokalemia: Secondary | ICD-10-CM | POA: Insufficient documentation

## 2022-10-26 DIAGNOSIS — I700 Atherosclerosis of aorta: Secondary | ICD-10-CM | POA: Insufficient documentation

## 2022-10-26 DIAGNOSIS — I2510 Atherosclerotic heart disease of native coronary artery without angina pectoris: Secondary | ICD-10-CM | POA: Insufficient documentation

## 2022-10-26 DIAGNOSIS — K8020 Calculus of gallbladder without cholecystitis without obstruction: Secondary | ICD-10-CM | POA: Insufficient documentation

## 2022-10-26 DIAGNOSIS — K5730 Diverticulosis of large intestine without perforation or abscess without bleeding: Secondary | ICD-10-CM | POA: Diagnosis not present

## 2022-10-26 DIAGNOSIS — Z7982 Long term (current) use of aspirin: Secondary | ICD-10-CM | POA: Diagnosis not present

## 2022-10-26 HISTORY — DX: Polyp of corpus uteri: N84.0

## 2022-10-26 LAB — COMPREHENSIVE METABOLIC PANEL
ALT: 14 U/L (ref 0–44)
AST: 20 U/L (ref 15–41)
Albumin: 3.7 g/dL (ref 3.5–5.0)
Alkaline Phosphatase: 82 U/L (ref 38–126)
Anion gap: 13 (ref 5–15)
BUN: 9 mg/dL (ref 8–23)
CO2: 23 mmol/L (ref 22–32)
Calcium: 8.3 mg/dL — ABNORMAL LOW (ref 8.9–10.3)
Chloride: 97 mmol/L — ABNORMAL LOW (ref 98–111)
Creatinine, Ser: 1.14 mg/dL — ABNORMAL HIGH (ref 0.44–1.00)
GFR, Estimated: 51 mL/min — ABNORMAL LOW (ref >60)
Glucose, Bld: 127 mg/dL — ABNORMAL HIGH (ref 70–99)
Potassium: 2.7 mmol/L — CL (ref 3.5–5.1)
Sodium: 133 mmol/L — ABNORMAL LOW (ref 135–145)
Total Bilirubin: 1.4 mg/dL — ABNORMAL HIGH (ref 0.3–1.2)
Total Protein: 7 g/dL (ref 6.5–8.1)

## 2022-10-26 LAB — CBC WITH DIFFERENTIAL/PLATELET
Abs Immature Granulocytes: 0.1 10*3/uL — ABNORMAL HIGH (ref 0.00–0.07)
Basophils Absolute: 0 10*3/uL (ref 0.0–0.1)
Basophils Relative: 0 %
Eosinophils Absolute: 0.6 10*3/uL — ABNORMAL HIGH (ref 0.0–0.5)
Eosinophils Relative: 4 %
HCT: 40.5 % (ref 36.0–46.0)
Hemoglobin: 13.5 g/dL (ref 12.0–15.0)
Immature Granulocytes: 1 %
Lymphocytes Relative: 6 %
Lymphs Abs: 1 10*3/uL (ref 0.7–4.0)
MCH: 31 pg (ref 26.0–34.0)
MCHC: 33.3 g/dL (ref 30.0–36.0)
MCV: 92.9 fL (ref 80.0–100.0)
Monocytes Absolute: 0.8 10*3/uL (ref 0.1–1.0)
Monocytes Relative: 5 %
Neutro Abs: 14.4 10*3/uL — ABNORMAL HIGH (ref 1.7–7.7)
Neutrophils Relative %: 84 %
Platelets: 290 10*3/uL (ref 150–400)
RBC: 4.36 MIL/uL (ref 3.87–5.11)
RDW: 13.6 % (ref 11.5–15.5)
WBC: 16.9 10*3/uL — ABNORMAL HIGH (ref 4.0–10.5)
nRBC: 0 % (ref 0.0–0.2)

## 2022-10-26 MED ORDER — IOHEXOL 300 MG/ML  SOLN
100.0000 mL | Freq: Once | INTRAMUSCULAR | Status: AC | PRN
  Administered 2022-10-26: 100 mL via INTRAVENOUS

## 2022-10-26 MED ORDER — FAMOTIDINE 20 MG PO TABS: 20.0000 mg | ORAL_TABLET | Freq: Two times a day (BID) | ORAL | 0 refills | Status: AC

## 2022-10-26 MED ORDER — POTASSIUM CHLORIDE CRYS ER 20 MEQ PO TBCR: 20.0000 meq | EXTENDED_RELEASE_TABLET | Freq: Every day | ORAL | 0 refills | Status: AC

## 2022-10-26 MED ORDER — POTASSIUM CHLORIDE CRYS ER 20 MEQ PO TBCR
40.0000 meq | EXTENDED_RELEASE_TABLET | Freq: Once | ORAL | Status: AC
  Administered 2022-10-26: 40 meq via ORAL
  Filled 2022-10-26: qty 2

## 2022-10-26 MED ORDER — POTASSIUM CHLORIDE 10 MEQ/100ML IV SOLN
10.0000 meq | Freq: Once | INTRAVENOUS | Status: AC
  Administered 2022-10-26: 10 meq via INTRAVENOUS
  Filled 2022-10-26: qty 100

## 2022-10-26 MED ORDER — SODIUM CHLORIDE 0.9 % IV BOLUS
1000.0000 mL | Freq: Once | INTRAVENOUS | Status: AC
  Administered 2022-10-26: 1000 mL via INTRAVENOUS

## 2022-10-26 NOTE — ED Notes (Signed)
Pt was unable to produce stool sample while in care at the ED. EDP suggested to try for a sample at home and return it to the hospital. Pt expressed understanding.

## 2022-10-26 NOTE — Discharge Instructions (Signed)
Follow-up with your doctor as planned in the next week or 2.  Take Imodium for diarrhea.  Bring back a stool specimen if you are able to.  He will be started on some potassium for your low potassium

## 2022-10-26 NOTE — ED Provider Notes (Signed)
[facility] Provider Note   CSN: [id number] Arrival date & time: 10/26/22  0944     History  Chief Complaint  Patient presents with   Diarrhea    Hannah Reynolds is a 77 y.o. female.  Patient has a history of coronary artery disease and GERD.  She was placed on antibiotics and prednisone for sinusitis.  She stopped taking the medicine Saturday.  Patient complains of diarrhea  The history is provided by the patient and medical records.  Diarrhea Quality:  Copious Severity:  Moderate Onset quality:  Sudden Timing:  Constant Progression:  Worsening Relieved by:  Nothing Worsened by:  Nothing Ineffective treatments:  None tried Associated symptoms: no abdominal pain and no headaches   Risk factors: recent antibiotic use        Home Medications Prior to Admission medications   Medication Sig Start Date End Date Taking? Authorizing Provider  ALPRAZolam (XANAX) 0.5 MG tablet TAKE (1) TABLET BY MOUTH TWICE DAILY AS NEEDED FOR ANXIETY. Patient taking differently: Take 0.5 mg by mouth 2 (two) times daily. 07/27/14  Yes Marin Olp, MD  aspirin EC 81 MG tablet Take 81 mg by mouth every morning.    Yes [provider]  cholecalciferol (VITAMIN D3) 25 MCG (1000 UNIT) tablet Take 1,000 Units by mouth daily.   Yes [provider]  famotidine (PEPCID) 20 MG tablet Take 1 tablet (20 mg total) by mouth 2 (two) times daily. 10/26/22  Yes Milton Ferguson, MD  isosorbide mononitrate (IMDUR) 30 MG 24 hr tablet Take 1 tablet (30 mg total) by mouth daily. Patient taking differently: Take 30 mg by mouth every morning. 07/22/14  Yes Marin Olp, MD  latanoprost (XALATAN) 0.005 % ophthalmic solution Place 1 drop into both eyes at bedtime.   Yes [provider]  levothyroxine (SYNTHROID) 75 MCG tablet Take 75 mcg by mouth daily.   Yes [provider]  potassium chloride SA (KLOR-CON M) 20 MEQ tablet Take 1  tablet (20 mEq total) by mouth daily. 10/26/22  Yes Milton Ferguson, MD  simvastatin (ZOCOR) 20 MG tablet Take 1 tablet (20 mg total) by mouth daily at 6 PM. 07/08/14  Yes Marin Olp, MD  triamterene-hydrochlorothiazide (MAXZIDE-25) 37.5-25 MG tablet Take 1 tablet by mouth daily. 10/03/22  Yes [provider]  VENTOLIN HFA 108 (90 Base) MCG/ACT inhaler Inhale 1-2 puffs into the lungs every 6 (six) hours as needed for wheezing or shortness of breath.  01/01/18  Yes [provider]  naproxen (NAPROSYN) 500 MG tablet TAKE (1) TABLET BY MOUTH TWICE A DAY AFTER MEALS. (BREAKFAST AND SUPPER). 12/09/19   Carole Civil, MD      Allergies    Augmentin [amoxicillin-pot clavulanate] and Penicillins    Review of Systems   Review of Systems  Constitutional:  Negative for appetite change and fatigue.  HENT:  Negative for congestion, ear discharge and sinus pressure.   Eyes:  Negative for discharge.  Respiratory:  Negative for cough.   Cardiovascular:  Negative for chest pain.  Gastrointestinal:  Positive for diarrhea. Negative for abdominal pain.  Genitourinary:  Negative for frequency and hematuria.  Musculoskeletal:  Negative for back pain.  Skin:  Negative for rash.  Neurological:  Negative for seizures and headaches.  Psychiatric/Behavioral:  Negative for hallucinations.     Physical Exam Updated Vital Signs BP 124/80   Pulse 99   Temp 98.1 F (36.7 C) (Oral)   Resp Marland Kitchen)  22   Ht 5\' 6"  (1.676 m)   Wt 77.1 kg   SpO2 96%   BMI 27.44 kg/m  Physical Exam Vitals and nursing note reviewed.  Constitutional:      Appearance: She is well-developed.  HENT:     Head: Normocephalic.     Nose: Nose normal.  Eyes:     General: No scleral icterus.    Conjunctiva/sclera: Conjunctivae normal.  Neck:     Thyroid: No thyromegaly.  Cardiovascular:     Rate and Rhythm: Normal rate and regular rhythm.     Heart sounds: No murmur heard.    No friction rub. No gallop.   Pulmonary:     Breath sounds: No stridor. No wheezing or rales.  Chest:     Chest wall: No tenderness.  Abdominal:     General: There is no distension.     Tenderness: There is no abdominal tenderness. There is no rebound.  Musculoskeletal:        General: Normal range of motion.     Cervical back: Neck supple.  Lymphadenopathy:     Cervical: No cervical adenopathy.  Skin:    Findings: No erythema or rash.  Neurological:     Mental Status: She is alert and oriented to person, place, and time.     Motor: No abnormal muscle tone.     Coordination: Coordination normal.  Psychiatric:        Behavior: Behavior normal.     ED Results / Procedures / Treatments   Labs (all labs ordered are listed, but only abnormal results are displayed) Labs Reviewed  CBC WITH DIFFERENTIAL/PLATELET - Abnormal; Notable for the following components:      Result Value   WBC 16.9 (*)    Neutro Abs 14.4 (*)    Eosinophils Absolute 0.6 (*)    Abs Immature Granulocytes 0.10 (*)    All other components within normal limits  COMPREHENSIVE METABOLIC PANEL - Abnormal; Notable for the following components:   Sodium 133 (*)    Potassium 2.7 (*)    Chloride 97 (*)    Glucose, Bld 127 (*)    Creatinine, Ser 1.14 (*)    Calcium 8.3 (*)    Total Bilirubin 1.4 (*)    GFR, Estimated 51 (*)    All other components within normal limits  C DIFFICILE QUICK SCREEN W PCR REFLEX    GASTROINTESTINAL PANEL BY PCR, STOOL (REPLACES STOOL CULTURE)    EKG None  Radiology CT ABDOMEN PELVIS W CONTRAST  Result Date: 10/26/2022 CLINICAL DATA:  Abdominal pain, diarrhea EXAM: CT ABDOMEN AND PELVIS WITH CONTRAST TECHNIQUE: Multidetector CT imaging of the abdomen and pelvis was performed using the standard protocol following bolus administration of intravenous contrast. RADIATION DOSE REDUCTION: This exam was performed according to the departmental dose-optimization program which includes automated exposure control,  adjustment of the mA and/or kV according to patient size and/or use of iterative reconstruction technique. CONTRAST:  172mL OMNIPAQUE IOHEXOL 300 MG/ML  SOLN COMPARISON:  08/23/2022 FINDINGS: Lower chest: No acute pleural or parenchymal lung disease. Hepatobiliary: Cholelithiasis without evidence of acute cholecystitis. The liver is unremarkable. No biliary duct dilation. Pancreas: Stable 1.5 x 1.1 cm hypodensity within the pancreatic body. The remainder of the pancreas is unremarkable. No acute inflammatory changes or pancreatic duct dilation. Spleen: Normal in size without focal abnormality. Adrenals/Urinary Tract: Adrenal glands are unremarkable. Kidneys are normal, without renal calculi, focal lesion, or hydronephrosis. Bladder is unremarkable. Stomach/Bowel: No bowel obstruction or ileus.  Postsurgical changes from right hemicolectomy. Diverticulosis of the sigmoid colon without evidence of acute diverticulitis. No bowel wall thickening or inflammatory change. Vascular/Lymphatic: Aortic atherosclerosis. No enlarged abdominal or pelvic lymph nodes. Subcentimeter mesenteric lymph nodes in the right hemiabdomen are unchanged since prior exam. Reproductive: Chronic abnormal thickening and heterogeneous enhancement of the endometrium indeterminate in nature. If not previously performed, pelvic ultrasound and gynecologic consultation are recommended. There are no adnexal masses. Other: No free fluid or free intraperitoneal gas. Postsurgical changes from prior ventral hernia repair. Musculoskeletal: No acute or destructive bony lesions. Reconstructed images demonstrate no additional findings. IMPRESSION: 1. Sigmoid diverticulosis without diverticulitis. 2. Cholelithiasis without cholecystitis. 3. Chronic abnormal endometrial thickening and heterogeneous enhancement. If not previously performed, pelvic ultrasound and gynecologic follow-up are again recommended. 4.  Aortic Atherosclerosis (ICD10-I70.0). Electronically  Signed   By: Randa Ngo M.D.   On: 10/26/2022 15:06    Procedures Procedures    Medications Ordered in ED Medications  potassium chloride 10 mEq in 100 mL IVPB (10 mEq Intravenous New Bag/Given 10/26/22 1523)  sodium chloride 0.9 % bolus 1,000 mL (1,000 mLs Intravenous New Bag/Given 10/26/22 1246)  potassium chloride SA (KLOR-CON M) CR tablet 40 mEq (40 mEq Oral Given 10/26/22 1250)  iohexol (OMNIPAQUE) 300 MG/ML solution 100 mL (100 mLs Intravenous Contrast Given 10/26/22 1437)    ED Course/ Medical Decision Making/ A&P                             Medical Decision Making Amount and/or Complexity of Data Reviewed Labs: ordered. Radiology: ordered.  Risk Prescription drug management.  This patient presents to the ED for concern of diarrhea, this involves an extensive number of treatment options, and is a complaint that carries with it a high risk of complications and morbidity.  The differential diagnosis includes C. difficile, gastroenteritis   Co morbidities that complicate the patient evaluation  Coronary artery disease and GERD   Additional history obtained:  Additional history obtained from patient External records from outside source obtained and reviewed including hospital record   Lab Tests:  I Ordered, and personally interpreted labs.  The pertinent results include: Potassium 2.7, white count 16.9   Imaging Studies ordered:  I ordered imaging studies including CT abdomen I independently visualized and interpreted imaging which showed diverticulosis, gallstones I agree with the radiologist interpretation   Cardiac Monitoring: / EKG:  The patient was maintained on a cardiac monitor.  I personally viewed and interpreted the cardiac monitored which showed an underlying rhythm of: Normal sinus rhythm   Consultations Obtained:  No consultant Problem List / ED Course / Critical interventions / Medication management  GERD and diarrhea I ordered  medication including normal saline for dehydration Reevaluation of the patient after these medicines showed that the patient improved I have reviewed the patients home medicines and have made adjustments as needed   Social Determinants of Health:  None   Test / Admission - Considered:  None  Patient with history of diarrhea.  She had no diarrhea in the emergency department.  CT scan unremarkable.  Patient with hypokalemia.  She is sent home on potassium and will bring back a stool specimen if available        Final Clinical Impression(s) / ED Diagnoses Final diagnoses:  Diarrhea of infectious origin    Rx / DC Orders ED Discharge Orders          Ordered    potassium  chloride SA (KLOR-CON M) 20 MEQ tablet  Daily        10/26/22 1537    famotidine (PEPCID) 20 MG tablet  2 times daily        10/26/22 1537              Milton Ferguson, MD 10/27/22 1710

## 2022-10-26 NOTE — ED Triage Notes (Signed)
Pt c/o diarrhea since last Friday. Denies abdominal pain, vomiting.

## 2022-12-20 ENCOUNTER — Other Ambulatory Visit: Payer: Self-pay | Admitting: Obstetrics and Gynecology

## 2023-01-22 NOTE — Progress Notes (Signed)
Spoke with [place] and made aware pt cannot be done at [facility] due to home oxygen 2 liters at hs and prn during day

## 2023-01-30 ENCOUNTER — Other Ambulatory Visit (HOSPITAL_COMMUNITY): Payer: Self-pay | Admitting: Adult Health Nurse Practitioner

## 2023-01-30 DIAGNOSIS — M816 Localized osteoporosis [Lequesne]: Secondary | ICD-10-CM

## 2023-02-08 ENCOUNTER — Ambulatory Visit: Admit: 2023-02-08 | Payer: Medicare Other | Admitting: Obstetrics and Gynecology

## 2023-02-08 SURGERY — DILATATION & CURETTAGE/HYSTEROSCOPY WITH MYOSURE
Anesthesia: Choice

## 2023-02-27 ENCOUNTER — Other Ambulatory Visit (HOSPITAL_COMMUNITY): Payer: Medicare Other

## 2023-03-05 ENCOUNTER — Ambulatory Visit (HOSPITAL_COMMUNITY)
Admission: RE | Admit: 2023-03-05 | Discharge: 2023-03-05 | Disposition: A | Discharge status: Home / Self Care | Payer: Medicare Other | Source: Ambulatory Visit | Attending: Adult Health Nurse Practitioner | Admitting: Adult Health Nurse Practitioner

## 2023-03-05 DIAGNOSIS — M816 Localized osteoporosis [Lequesne]: Secondary | ICD-10-CM | POA: Insufficient documentation
